# Patient Record
Sex: Female | Born: 1995 | Race: White | Hispanic: No | Smoking: Never smoker
Health system: Southern US, Community
[De-identification: ages and names within clinical notes are randomized; demographics above are authoritative.]

## PROBLEM LIST (undated history)

## (undated) DIAGNOSIS — F319 Bipolar disorder, unspecified: Secondary | ICD-10-CM

## (undated) DIAGNOSIS — F32A Depression, unspecified: Secondary | ICD-10-CM

## (undated) DIAGNOSIS — N83209 Unspecified ovarian cyst, unspecified side: Secondary | ICD-10-CM

## (undated) DIAGNOSIS — F329 Major depressive disorder, single episode, unspecified: Secondary | ICD-10-CM

## (undated) DIAGNOSIS — J45909 Unspecified asthma, uncomplicated: Secondary | ICD-10-CM

## (undated) DIAGNOSIS — R102 Pelvic and perineal pain: Secondary | ICD-10-CM

## (undated) DIAGNOSIS — R112 Nausea with vomiting, unspecified: Secondary | ICD-10-CM

## (undated) DIAGNOSIS — G8929 Other chronic pain: Secondary | ICD-10-CM

## (undated) DIAGNOSIS — L0591 Pilonidal cyst without abscess: Secondary | ICD-10-CM

## (undated) DIAGNOSIS — N809 Endometriosis, unspecified: Secondary | ICD-10-CM

## (undated) DIAGNOSIS — Z9889 Other specified postprocedural states: Secondary | ICD-10-CM

## (undated) DIAGNOSIS — F419 Anxiety disorder, unspecified: Secondary | ICD-10-CM

## (undated) DIAGNOSIS — F4310 Post-traumatic stress disorder, unspecified: Secondary | ICD-10-CM

## (undated) HISTORY — PX: WISDOM TOOTH EXTRACTION: SHX21

## (undated) HISTORY — DX: Pilonidal cyst without abscess: L05.91

## (undated) HISTORY — DX: Anxiety disorder, unspecified: F41.9

## (undated) HISTORY — PX: ESOPHAGOGASTRODUODENOSCOPY: SHX1529

## (undated) HISTORY — PX: COLONOSCOPY WITH ESOPHAGOGASTRODUODENOSCOPY (EGD): SHX5779

## (undated) HISTORY — DX: Major depressive disorder, single episode, unspecified: F32.9

## (undated) HISTORY — DX: Depression, unspecified: F32.A

---
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
## 2015-02-15 NOTE — H&P (Signed)
   Subjective/Chief Complaint RUQ/right sided pain   History of Present Illness [name] is a pleasant 19 yo F with relatively no medical history who presents with 1 day of right sided/RUQ pain.  She says that it began acutely.  Describes it as stabbing.  + nausea, no vomiting.  + subjective fevers.  No BM.  Has never had this pain before.   Past Med/Surgical Hx:  Asthma:   GERD:   ALLERGIES:  No Known Allergies:   Family and Social History:  Family History Cancer   Social History negative tobacco, negative ETOH   Place of Living Home   Review of Systems:  Subjective/Chief Complaint RUQ pain/Right sided abd pain   Fever/Chills Yes   Cough No   Sputum No   Abdominal Pain Yes   Diarrhea No   Constipation No   Nausea/Vomiting Yes   SOB/DOE No   Chest Pain No   Dysuria No   Tolerating Diet No  Nauseated   Physical Exam:  GEN well developed, well nourished, no acute distress   HEENT pink conjunctivae, PERRL   RESP normal resp effort  clear BS  no use of accessory muscles   CARD regular rate  no thrills  No LE edema   ABD positive tenderness  denies Flank Tenderness  no hernia  soft  distended  Mild right sided/RUQ tenderness   EXTR negative cyanosis/clubbing, negative edema   SKIN normal to palpation, No rashes, No ulcers   NEURO cranial nerves intact, negative Babinski R/L, negative rigidity, negative tremor, follows commands, strength:   PSYCH A+O to time, place, person, good insight    Assessment/Admission Diagnosis [name] is a pleasant 19 yo F with 1 day of right sided abdominal pain, leukocytosis, mild stranding around right colon at hepatic flexure with a few calcified diverticula.  Unusual for a patient of this age but favor diverticulitis.  Uncomfortable but not ill appearing.   Plan Admit for NPO, IV antibiotics, serial abdominal exams.  No acute surgical issues at this time.   Electronic Signatures: Jarvis NewcomerLundquist, Mansoor Hillyard A (MD)  (Signed  19-Apr-15 04:45)  Authored: CHIEF COMPLAINT and HISTORY, PAST MEDICAL/SURGIAL HISTORY, ALLERGIES, FAMILY AND SOCIAL HISTORY, REVIEW OF SYSTEMS, PHYSICAL EXAM, ASSESSMENT AND PLAN   Last Updated: 19-Apr-15 04:45 by Jarvis NewcomerLundquist, Morning Halberg A (MD)

## 2015-02-15 NOTE — Discharge Summary (Signed)
PATIENT NAME:  Madison Dixon, Madison Dixon MR#:  [id number] DATE OF BIRTH:  [birth year]  DATE OF ADMISSION:  02/10/2014  DATE OF DISCHARGE:  02/13/2014  DIAGNOSES:  1.  Abdominal pain. 2.  Colitis.   PROCEDURES: None.   CONSULTANTS: None.   HISTORY OF PRESENT ILLNESS AND HOSPITAL COURSE:  This is a 19 year old female patient who has had no medical history in the past, but presented with one day of right upper quadrant and right side pain. Dr. Juliann PulseLundquist had seen the patient in the Emergency Room, and admitted her through the Emergency Room after CT scan suggested colitis of the hepatic flexure. Her white blood cell count was normal, but her physical exam suggested right upper quadrant pain and tenderness without peritoneal signs. She was started on IV antibiotics. I took over her care the next day, and she remained tender. I asked Dr. Michela PitcherEly to see the patient as well, and I ordered an ultrasound of the right upper quadrant to ensure that this was not gallbladder disease. There was no evidence of gallbladder disease. Subsequently, the IV antibiotics appeared to work and the patient was doing well. Her pain was not gone, but nearly gone, nearly resolved. She was tolerating a regular diet. There are no pediatric GI consultants available at the hospital. Therefore, it was decided, with her improvement and resolution of this pain and tenderness, on IV antibiotics, that she be continued on oral antibiotics. She was given Cipro and Flagyl p.o. and oral Vicodin for pain, if she were to require it at home.   I have asked her to see her primary care physician in the next week, and ask for a referral to their favored pediatric GI physician. I mentioned that there is a pediatric GI in [place] as well as at all of the major tertiary care facilities around [place]. This was discussed in detail with the grandmother, who voiced agreement and understanding of the plan. She will follow up in our office as needed.       ____________________________ Adah Salvageichard E. Excell Seltzerooper, MD rec:mr D: 02/13/2014 16:19:11 ET T: 02/13/2014 19:01:56 ET JOB#: [id number]  cc: Adah Salvageichard E. Excell Seltzerooper, MD, <Dictator> Lattie HawICHARD E Cova Knieriem MD ELECTRONICALLY SIGNED 02/14/2014 7:45

## 2015-03-06 ENCOUNTER — Encounter: Payer: Self-pay | Admitting: *Deleted

## 2015-03-06 ENCOUNTER — Ambulatory Visit (INDEPENDENT_AMBULATORY_CARE_PROVIDER_SITE_OTHER): Payer: Medicaid Other | Admitting: General Surgery

## 2015-03-06 ENCOUNTER — Encounter: Payer: Self-pay | Admitting: General Surgery

## 2015-03-06 DIAGNOSIS — L0592 Pilonidal sinus without abscess: Secondary | ICD-10-CM

## 2015-03-06 DIAGNOSIS — L0591 Pilonidal cyst without abscess: Secondary | ICD-10-CM | POA: Diagnosis not present

## 2015-03-06 NOTE — Progress Notes (Signed)
Patient ID: Madison Dixon, female   DOB: [birth year], 19 y.o.   MRN: [id number]  Chief Complaint  Patient presents with  . Other    pilonidal sinus    HPI Madison Dixon is a 19 y.o. female.  Here today for evaluation of pilonidal cyst. She states she has had trouble on and off for many years but never told anyone. She states it would usually clear up on its on. She states it started draining last week and the antibiotics have helped. She has been on the antibiotics for 3 days.   HPI  History reviewed. No pertinent past medical history.  History reviewed. No pertinent past surgical history.  History reviewed. No pertinent family history.  Social History History  Substance Use Topics  . Smoking status: Never Smoker   . Smokeless tobacco: Never Used  . Alcohol Use: No    No Known Allergies  Current Outpatient Prescriptions  Medication Sig Dispense Refill  . amoxicillin-clavulanate (AUGMENTIN) 875-125 MG per tablet Take 1 tablet by mouth 2 (two) times daily.  0  . etonogestrel (NEXPLANON) 68 MG IMPL implant 1 each by Subdermal route once.     No current facility-administered medications for this visit.    Review of Systems Review of Systems  Constitutional: Negative.   Respiratory: Negative.   Cardiovascular: Negative.     Blood pressure 100/68, pulse 66, resp. rate 12, height 5\' 2"  (1.575 m), weight 137 lb (62.143 kg), last menstrual period 02/08/2015.  Physical Exam Physical Exam  Constitutional: She is oriented to person, place, and time. She appears well-developed and well-nourished.  Neck: Neck supple.  Cardiovascular: Normal rate, regular rhythm and normal heart sounds.   Pulmonary/Chest: Effort normal and breath sounds normal.  Lymphadenopathy:    She has no cervical adenopathy.  Neurological: She is alert and oriented to person, place, and time.  Skin: Skin is warm and dry.     Prominent sinus track with some unhealthy tissue.    Data Reviewed Office  notes.  Assessment    Pilonidal sinus    Plan    With consent the sinus tract  Was cauterized with silver nitrate. Patient advised on local hygiene. Complete current course of ABX.Follow up in 2 weeks.         PCP:  Woodfin Ganjahavin, Bob  SANKAR,SEEPLAPUTHUR G 03/06/2015, 11:07 AM

## 2015-03-06 NOTE — Patient Instructions (Signed)
Pilonidal Cyst A pilonidal cyst occurs when hairs get trapped (ingrown) beneath the skin in the crease between the buttocks over your sacrum (the bone under that crease). Pilonidal cysts are most common in young men with a lot of body hair. When the cyst is ruptured (breaks) or leaking, fluid from the cyst may cause burning and itching. If the cyst becomes infected, it causes a painful swelling filled with pus (abscess). The pus and trapped hairs need to be removed (often by lancing) so that the infection can heal. However, recurrence is common and an operation may be needed to remove the cyst. HOME CARE INSTRUCTIONS   If the cyst was NOT INFECTED:  Keep the area clean and dry. Bathe or shower daily. Wash the area well with a germ-killing soap. Warm tub baths may help prevent infection and help with drainage. Dry the area well with a towel.  Avoid tight clothing to keep area as moisture free as possible.  Keep area between buttocks as free of hair as possible. A depilatory may be used.  If the cyst WAS INFECTED and needed to be drained:  Your caregiver packed the wound with gauze to keep the wound open. This allows the wound to heal from the inside outwards and continue draining.  Return for a wound check in 1 day or as suggested.  If you take tub baths or showers, repack the wound with gauze following them. Sponge baths (at the sink) are a good alternative.  If an antibiotic was ordered to fight the infection, take as directed.  Only take over-the-counter or prescription medicines for pain, discomfort, or fever as directed by your caregiver.  After the drain is removed, use sitz baths for 20 minutes 4 times per day. Clean the wound gently with mild unscented soap, pat dry, and then apply a dry dressing. SEEK MEDICAL CARE IF:   You have increased pain, swelling, redness, drainage, or bleeding from the area.  You have a fever.  You have muscles aches, dizziness, or a general ill  feeling. Document Released: 10/08/2000 Document Revised: 01/03/2012 Document Reviewed: 12/06/2008 ExitCare Patient Information 2015 [employer]. This information is not intended to replace advice given to you by your health care provider. Make sure you discuss any questions you have with your health care provider.  

## 2015-03-20 ENCOUNTER — Encounter: Payer: Self-pay | Admitting: General Surgery

## 2015-03-20 ENCOUNTER — Encounter: Payer: Self-pay | Admitting: *Deleted

## 2015-03-20 ENCOUNTER — Ambulatory Visit (INDEPENDENT_AMBULATORY_CARE_PROVIDER_SITE_OTHER): Payer: Medicaid Other | Admitting: General Surgery

## 2015-03-20 VITALS — BP 120/72 | HR 68 | Resp 12 | Ht 62.0 in | Wt 147.0 lb

## 2015-03-20 DIAGNOSIS — L0592 Pilonidal sinus without abscess: Secondary | ICD-10-CM

## 2015-03-20 DIAGNOSIS — L0591 Pilonidal cyst without abscess: Secondary | ICD-10-CM

## 2015-03-20 MED ORDER — SULFAMETHOXAZOLE-TRIMETHOPRIM 400-80 MG PO TABS: 1.0000 | ORAL_TABLET | Freq: Two times a day (BID) | ORAL | Status: DC

## 2015-03-20 NOTE — Progress Notes (Signed)
Patient ID: Madison Dixon, female   DOB: 04-24-1996, 19 y.o.   MRN: [id number]  Chief Complaint  Patient presents with  . Follow-up    HPI Madison Dixon is a 19 y.o. female.  Here today for follow up evaluation of pilonidal cyst. She states she started vomiting with the antibiotics so she stopped taking them at the advise of Toni ArthursBob Chauvin. She states the area started bleeding yesterday a small amount but before that it was OK.   HPI  Past Medical History  Diagnosis Date  . Pilonidal cyst     History reviewed. No pertinent past surgical history.  History reviewed. No pertinent family history.  Social History History  Substance Use Topics  . Smoking status: Never Smoker   . Smokeless tobacco: Never Used  . Alcohol Use: No    No Known Allergies  Current Outpatient Prescriptions  Medication Sig Dispense Refill  . etonogestrel (NEXPLANON) 68 MG IMPL implant 1 each by Subdermal route once.    . sulfamethoxazole-trimethoprim (BACTRIM,SEPTRA) 400-80 MG per tablet Take 1 tablet by mouth 2 (two) times daily. 20 tablet 0   No current facility-administered medications for this visit.    Review of Systems Review of Systems  Constitutional: Negative.   Respiratory: Negative.   Cardiovascular: Negative.   Gastrointestinal: Positive for vomiting.  Neurological: Positive for headaches.    Blood pressure 120/72, pulse 68, resp. rate 12, height 5\' 2"  (1.575 m), weight 147 lb (66.679 kg), last menstrual period 02/08/2015.  Physical Exam Physical Exam  Constitutional: She is oriented to person, place, and time. She appears well-developed and well-nourished.  Eyes: Conjunctivae are normal. No scleral icterus.  Cardiovascular: Normal rate, regular rhythm and normal heart sounds.   Pulmonary/Chest: Effort normal and breath sounds normal.  Neurological: She is alert and oriented to person, place, and time.  Skin: Skin is warm and dry.  Pilonidal sinus tract remains.  There is some  serous drainage noted  Data Reviewed Office notes.  Assessment    Pilonidal sinus remains-likely will benefit with excision. Discussed fully with pt and she is agreeable     Plan    Discussed risk and benefits of excision of pilonidal sinus tract.    Patient's surgery has been [facility].  PCP:  Jerald Kiefhauvin, Robert   SANKAR,SEEPLAPUTHUR G 03/20/2015, 3:10 PM

## 2015-03-20 NOTE — Patient Instructions (Addendum)
The patient is aware to call back for any questions or concerns.  Patient's surgery has been scheduled for 04[facility]10-15 at [facility].

## 2015-04-03 ENCOUNTER — Other Ambulatory Visit: Payer: Self-pay

## 2015-04-03 ENCOUNTER — Encounter: Payer: Self-pay | Admitting: *Deleted

## 2015-04-03 DIAGNOSIS — L0591 Pilonidal cyst without abscess: Secondary | ICD-10-CM | POA: Diagnosis not present

## 2015-04-03 DIAGNOSIS — Z79899 Other long term (current) drug therapy: Secondary | ICD-10-CM | POA: Diagnosis not present

## 2015-04-03 NOTE — Patient Instructions (Signed)
  Your procedure is scheduled on:04/10/15 Report to Day Surgery. To find out your arrival time please call [phone] between 1PM - 3PM on 04/09/15.  Remember: Instructions that are not followed completely may result in serious medical risk, up to and including death, or upon the discretion of your surgeon and anesthesiologist your surgery may need to be rescheduled.    __x_ 1. Do not eat food or drink liquids after midnight. No gum chewing or hard candies.     __x__ 2. No Alcohol for 24 hours before or after surgery.   ____ 3. Bring all medications with you on the day of surgery if instructed.    ___x_ 4. Notify your doctor if there is any change in your medical condition     (cold, fever, infections).     Do not wear jewelry, make-up, hairpins, clips or nail polish.  Do not wear lotions, powders, or perfumes. You may wear deodorant.  Do not shave 48 hours prior to surgery. Men may shave face and neck.  Do not bring valuables to the hospital.    [facility] is not responsible for any belongings or valuables.               Contacts, dentures or bridgework may not be worn into surgery.  Leave your suitcase in the car. After surgery it may be brought to your room.  For patients admitted to the hospital, discharge time is determined by your                treatment team.   Patients discharged the day of surgery will not be allowed to drive home.   Please read over the following fact sheets that you were given:   Surgical Site Infection Prevention   ____ Take these medicines the morning of surgery with A SIP OF WATER:    1.   2.   3.   4.  5.  6.  ____ Fleet Enema (as directed)   __x__ Use CHG Soap as directed  ____ Use inhalers on the day of surgery  ____ Stop metformin 2 days prior to surgery    ____ Take 1/2 of usual insulin dose the night before surgery and none on the morning of surgery.   ____ Stop Coumadin/Plavix/aspirin on   ____ Stop Anti-inflammatories  on ____ Stop supplements until after surgery.    ____ Bring C-Pap to the hospital.

## 2015-04-09 ENCOUNTER — Encounter: Payer: Self-pay | Admitting: *Deleted

## 2015-04-10 ENCOUNTER — Ambulatory Visit: Payer: Medicaid Other | Admitting: Anesthesiology

## 2015-04-10 ENCOUNTER — Ambulatory Visit
Admission: RE | Admit: 2015-04-10 | Discharge: 2015-04-10 | Disposition: A | Discharge status: Home / Self Care | Payer: Medicaid Other | Source: Ambulatory Visit | Attending: General Surgery | Admitting: General Surgery

## 2015-04-10 ENCOUNTER — Encounter (INDEPENDENT_AMBULATORY_CARE_PROVIDER_SITE_OTHER): Payer: Medicaid Other | Admitting: General Surgery

## 2015-04-10 ENCOUNTER — Encounter
Admission: RE | Disposition: A | Discharge status: Home / Self Care | Payer: Self-pay | Source: Ambulatory Visit | Attending: General Surgery

## 2015-04-10 ENCOUNTER — Encounter: Payer: Self-pay | Admitting: *Deleted

## 2015-04-10 DIAGNOSIS — Z79899 Other long term (current) drug therapy: Secondary | ICD-10-CM | POA: Insufficient documentation

## 2015-04-10 DIAGNOSIS — L0592 Pilonidal sinus without abscess: Secondary | ICD-10-CM

## 2015-04-10 DIAGNOSIS — L0591 Pilonidal cyst without abscess: Secondary | ICD-10-CM | POA: Diagnosis not present

## 2015-04-10 HISTORY — PX: PILONIDAL CYST EXCISION: SHX744

## 2015-04-10 HISTORY — DX: Unspecified asthma, uncomplicated: J45.909

## 2015-04-10 LAB — GLUCOSE, CAPILLARY: Glucose-Capillary: 87 mg/dL (ref 65–99)

## 2015-04-10 LAB — POCT PREGNANCY, URINE: PREG TEST UR: NEGATIVE

## 2015-04-10 SURGERY — EXCISION, PILONIDAL CYST, EXTENSIVE
Anesthesia: General

## 2015-04-10 MED ORDER — LIDOCAINE HCL (CARDIAC) 20 MG/ML IV SOLN
INTRAVENOUS | Status: DC | PRN
  Administered 2015-04-10: 100 mg via INTRAVENOUS

## 2015-04-10 MED ORDER — FENTANYL CITRATE (PF) 100 MCG/2ML IJ SOLN
INTRAMUSCULAR | Status: DC | PRN
  Administered 2015-04-10 (×2): 50 ug via INTRAVENOUS
  Administered 2015-04-10: 100 ug via INTRAVENOUS

## 2015-04-10 MED ORDER — CEFAZOLIN SODIUM-DEXTROSE 2-3 GM-% IV SOLR
2.0000 g | INTRAVENOUS | Status: AC
  Administered 2015-04-10: 2 g via INTRAVENOUS

## 2015-04-10 MED ORDER — BUPIVACAINE HCL 0.5 % IJ SOLN
INTRAMUSCULAR | Status: DC | PRN
  Administered 2015-04-10: 10 mL

## 2015-04-10 MED ORDER — FENTANYL CITRATE (PF) 100 MCG/2ML IJ SOLN
INTRAMUSCULAR | Status: AC
  Administered 2015-04-10: 25 ug via INTRAVENOUS
  Filled 2015-04-10: qty 2

## 2015-04-10 MED ORDER — LACTATED RINGERS IV SOLN
INTRAVENOUS | Status: DC
  Administered 2015-04-10: 07:00:00 via INTRAVENOUS
  Administered 2015-04-10: 1000 mL via INTRAVENOUS
  Administered 2015-04-10: 07:00:00 via INTRAVENOUS

## 2015-04-10 MED ORDER — CEFAZOLIN SODIUM-DEXTROSE 2-3 GM-% IV SOLR
INTRAVENOUS | Status: AC
  Administered 2015-04-10: 2 g via INTRAVENOUS
  Filled 2015-04-10: qty 50

## 2015-04-10 MED ORDER — ONDANSETRON HCL 4 MG/2ML IJ SOLN
INTRAMUSCULAR | Status: DC | PRN
  Administered 2015-04-10: 4 mg via INTRAVENOUS

## 2015-04-10 MED ORDER — HYDROMORPHONE HCL 1 MG/ML IJ SOLN: 0.2500 mg | INTRAMUSCULAR | Status: DC | PRN

## 2015-04-10 MED ORDER — FENTANYL CITRATE (PF) 100 MCG/2ML IJ SOLN
25.0000 ug | INTRAMUSCULAR | Status: AC | PRN
  Administered 2015-04-10 (×6): 25 ug via INTRAVENOUS

## 2015-04-10 MED ORDER — BUPIVACAINE HCL (PF) 0.5 % IJ SOLN
INTRAMUSCULAR | Status: AC
  Filled 2015-04-10: qty 30

## 2015-04-10 MED ORDER — ONDANSETRON HCL 4 MG/2ML IJ SOLN: 4.0000 mg | Freq: Once | INTRAMUSCULAR | Status: DC | PRN

## 2015-04-10 MED ORDER — OXYCODONE-ACETAMINOPHEN 5-325 MG PO TABS
ORAL_TABLET | ORAL | Status: AC
  Filled 2015-04-10: qty 1

## 2015-04-10 MED ORDER — SUCCINYLCHOLINE CHLORIDE 20 MG/ML IJ SOLN
INTRAMUSCULAR | Status: DC | PRN
  Administered 2015-04-10: 70 mg via INTRAVENOUS

## 2015-04-10 MED ORDER — PROPOFOL 10 MG/ML IV BOLUS
INTRAVENOUS | Status: DC | PRN
  Administered 2015-04-10: 150 mg via INTRAVENOUS
  Administered 2015-04-10: 30 mg via INTRAVENOUS

## 2015-04-10 MED ORDER — OXYCODONE-ACETAMINOPHEN 5-325 MG PO TABS: 1.0000 | ORAL_TABLET | ORAL | Status: DC | PRN

## 2015-04-10 MED ORDER — MIDAZOLAM HCL 2 MG/2ML IJ SOLN
INTRAMUSCULAR | Status: DC | PRN
  Administered 2015-04-10: 2 mg via INTRAVENOUS

## 2015-04-10 SURGICAL SUPPLY — 24 items
BRIEF STRETCH MATERNITY 2XLG (MISCELLANEOUS) ×2 IMPLANT
CANISTER SUCT 1200ML W/VALVE (MISCELLANEOUS) ×2 IMPLANT
CHLORAPREP W/TINT 26ML (MISCELLANEOUS) IMPLANT
DRAPE LAPAROTOMY 100X77 ABD (DRAPES) ×2 IMPLANT
DRESSING TELFA 4X3 1S ST N-ADH (GAUZE/BANDAGES/DRESSINGS) IMPLANT
DRSG TEGADERM 4X4.75 (GAUZE/BANDAGES/DRESSINGS) IMPLANT
GLOVE BIO SURGEON STRL SZ7 (GLOVE) ×14 IMPLANT
GOWN STRL REUS W/ TWL LRG LVL3 (GOWN DISPOSABLE) ×4 IMPLANT
GOWN STRL REUS W/TWL LRG LVL3 (GOWN DISPOSABLE) ×4
KIT RM TURNOVER STRD PROC AR (KITS) ×2 IMPLANT
LABEL OR SOLS (LABEL) IMPLANT
LIQUID BAND (GAUZE/BANDAGES/DRESSINGS) ×2 IMPLANT
NDL SAFETY 25GX1.5 (NEEDLE) ×2 IMPLANT
NS IRRIG 500ML POUR BTL (IV SOLUTION) ×2 IMPLANT
PACK BASIN MINOR ARMC (MISCELLANEOUS) ×2 IMPLANT
PAD GROUND ADULT SPLIT (MISCELLANEOUS) ×2 IMPLANT
PAD OB MATERNITY 4.3X12.25 (PERSONAL CARE ITEMS) ×2 IMPLANT
SCRUB POVIDONE IODINE 4 OZ (MISCELLANEOUS) ×2 IMPLANT
STRIP CLOSURE SKIN 1/2X4 (GAUZE/BANDAGES/DRESSINGS) IMPLANT
SUT MNCRL AB 3-0 PS2 27 (SUTURE) ×2 IMPLANT
SUT VIC AB 2-0 CT1 27 (SUTURE) ×2
SUT VIC AB 2-0 CT1 TAPERPNT 27 (SUTURE) ×2 IMPLANT
SYR BULB IRRIG 60ML STRL (SYRINGE) ×2 IMPLANT
SYR CONTROL 10ML (SYRINGE) ×2 IMPLANT

## 2015-04-10 NOTE — Transfer of Care (Signed)
Immediate Anesthesia Transfer of Care Note  Patient: Madison Dixon  Procedure(s) Performed: Procedure(s): CYST EXCISION PILONIDAL EXTENSIVE (N/A)  Patient Location: PACU  Anesthesia Type:General  Level of Consciousness: awake  Airway & Oxygen Therapy: Patient Spontanous Breathing  Post-op Assessment: Report given to RN  Post vital signs: stable  Last Vitals:  Filed Vitals:   04/10/15 0839  BP: 108/62  Pulse: 84  Temp: 37.4 C  Resp: 16   Past Medical History  Diagnosis Date  . Pilonidal cyst   . Asthma     smoke induced   History reviewed. No pertinent past surgical history.  Complications: No apparent anesthesia complications

## 2015-04-10 NOTE — Anesthesia Postprocedure Evaluation (Signed)
  Anesthesia Post-op Note  Patient: Madison Dixon  Procedure(s) Performed: Procedure(s): CYST EXCISION PILONIDAL EXTENSIVE (N/A)  Anesthesia type:General  Patient location: PACU  Post pain: Pain level controlled  Post assessment: Post-op Vital signs reviewed, Patient's Cardiovascular Status Stable, Respiratory Function Stable, Patent Airway and No signs of Nausea or vomiting  Post vital signs: Reviewed and stable  Last Vitals:  Filed Vitals:   04/10/15 0910  BP:   Pulse: 69  Temp:   Resp: 12    Level of consciousness: awake, alert  and patient cooperative  Complications: No apparent anesthesia complications

## 2015-04-10 NOTE — Op Note (Signed)
Preop diagnosis: Pilonidal sinus  Post op diagnosis: Same  Operation: Excision pilonidal sinus  Surgeon: S.G.Zohra Clavel  Assistant:     Anesthesia: Gen.  Complications: None  EBL: Minimal  Drains: None  Description: Patient was brought to the operating room and put to sleep with an endotracheal tube she was then moved from the stretcher to the operating table and placed on the prone position with attention paid to all pressure points. The gluteal region was then satisfactorily exposed and prepped and draped sterile field. Timeout was performed. Patient had a prominent sinus opening connecting to were the small skin openings inferior to this in the upper most part of the gluteal cleft. A sinus probe was inserted and the tract extended down to the lower sterile skin opening in the midline no surrounding induration was noted. Elliptical vertical excision of this was performed. After skin incision was made and was deepened in a perpendicular fashion down to the fascia. And the entire pilonidal sinus was excised out from the underlying fascia completely. Bleeding was controlled with cautery. The deeper tissues including the fascia was approximated with interrupted 2-0 Vicryl stitches. Skin was closed with a running subcuticular stitch of 3-0 Monocryl. Liqui ban was applied. A padded dressing placed over this. Patient subsequently was placed supine extubated and returned recovery room in stable condition.

## 2015-04-10 NOTE — Interval H&P Note (Signed)
History and Physical Interval Note:  04/10/2015 6:58 AM  Madison Dixon  has presented today for surgery, with the diagnosis of PILONIDAL SINUS,  The various methods of treatment have been discussed with the patient and family. After consideration of risks, benefits and other options for treatment, the patient has consented to  Procedure(s): CYST EXCISION PILONIDAL EXTENSIVE (N/A) as a surgical intervention .  The patient's history has been reviewed, patient examined, no change in status, stable for surgery.  I have reviewed the patient's chart and labs.  Questions were answered to the patient's satisfaction.     Wavie Hashimi G

## 2015-04-10 NOTE — Anesthesia Preprocedure Evaluation (Signed)
Anesthesia Evaluation  Patient identified by MRN, date of birth, ID band Patient awake    Reviewed: Allergy & Precautions, NPO status , Patient's Chart, lab work & pertinent test results  History of Anesthesia Complications Negative for: history of anesthetic complications  Airway Mallampati: II  TM Distance: >3 FB Neck ROM: Full    Dental no notable dental hx.    Pulmonary asthma ,  breath sounds clear to auscultation  Pulmonary exam normal       Cardiovascular Exercise Tolerance: Good negative cardio ROS Normal cardiovascular examRhythm:Regular Rate:Normal     Neuro/Psych negative neurological ROS  negative psych ROS   GI/Hepatic negative GI ROS, Neg liver ROS,   Endo/Other  negative endocrine ROS  Renal/GU negative Renal ROS  negative genitourinary   Musculoskeletal negative musculoskeletal ROS (+)   Abdominal   Peds negative pediatric ROS (+)  Hematology negative hematology ROS (+)   Anesthesia Other Findings   Reproductive/Obstetrics negative OB ROS                             Anesthesia Physical Anesthesia Plan  ASA: II  Anesthesia Plan: General   Post-op Pain Management:    Induction: Intravenous  Airway Management Planned: Oral ETT  Additional Equipment:   Intra-op Plan:   Post-operative Plan: Extubation in OR  Informed Consent: I have reviewed the patients History and Physical, chart, labs and discussed the procedure including the risks, benefits and alternatives for the proposed anesthesia with the patient or authorized representative who has indicated his/her understanding and acceptance.   Dental advisory given  Plan Discussed with: CRNA and Surgeon  Anesthesia Plan Comments:         Anesthesia Quick Evaluation

## 2015-04-10 NOTE — H&P (View-Only) (Signed)
Patient ID: Madison Dixon, female   DOB: 23-Jul-1996, 19 y.o.   MRN: [id number]  Chief Complaint  Patient presents with  . Follow-up    HPI Madison Dixon is a 19 y.o. female.  Here today for follow up evaluation of pilonidal cyst. She states she started vomiting with the antibiotics so she stopped taking them at the advise of Toni Arthurs. She states the area started bleeding yesterday a small amount but before that it was OK.   HPI  Past Medical History  Diagnosis Date  . Pilonidal cyst     History reviewed. No pertinent past surgical history.  History reviewed. No pertinent family history.  Social History History  Substance Use Topics  . Smoking status: Never Smoker   . Smokeless tobacco: Never Used  . Alcohol Use: No    No Known Allergies  Current Outpatient Prescriptions  Medication Sig Dispense Refill  . etonogestrel (NEXPLANON) 68 MG IMPL implant 1 each by Subdermal route once.    . sulfamethoxazole-trimethoprim (BACTRIM,SEPTRA) 400-80 MG per tablet Take 1 tablet by mouth 2 (two) times daily. 20 tablet 0   No current facility-administered medications for this visit.    Review of Systems Review of Systems  Constitutional: Negative.   Respiratory: Negative.   Cardiovascular: Negative.   Gastrointestinal: Positive for vomiting.  Neurological: Positive for headaches.    Blood pressure 120/72, pulse 68, resp. rate 12, height 5\' 2"  (1.575 m), weight 147 lb (66.679 kg), last menstrual period 02/08/2015.  Physical Exam Physical Exam  Constitutional: She is oriented to person, place, and time. She appears well-developed and well-nourished.  Eyes: Conjunctivae are normal. No scleral icterus.  Cardiovascular: Normal rate, regular rhythm and normal heart sounds.   Pulmonary/Chest: Effort normal and breath sounds normal.  Neurological: She is alert and oriented to person, place, and time.  Skin: Skin is warm and dry.  Pilonidal sinus tract remains.  There is some  serous drainage noted  Data Reviewed Office notes.  Assessment    Pilonidal sinus remains-likely will benefit with excision. Discussed fully with pt and she is agreeable     Plan    Discussed risk and benefits of excision of pilonidal sinus tract.    Patient's surgery has been scheduled for 04-10-15 at [facility].  PCP:  Jerald Kief 03/20/2015, 3:10 PM

## 2015-04-11 LAB — SURGICAL PATHOLOGY

## 2015-04-17 ENCOUNTER — Ambulatory Visit: Payer: Medicaid Other | Admitting: General Surgery

## 2015-04-21 ENCOUNTER — Encounter: Payer: Self-pay | Admitting: General Surgery

## 2015-04-21 ENCOUNTER — Ambulatory Visit (INDEPENDENT_AMBULATORY_CARE_PROVIDER_SITE_OTHER): Payer: Medicaid Other | Admitting: General Surgery

## 2015-04-21 VITALS — BP 118/66 | HR 62 | Resp 12 | Ht 62.0 in | Wt 144.0 lb

## 2015-04-21 DIAGNOSIS — L0592 Pilonidal sinus without abscess: Secondary | ICD-10-CM

## 2015-04-21 DIAGNOSIS — L0591 Pilonidal cyst without abscess: Secondary | ICD-10-CM

## 2015-04-21 NOTE — Progress Notes (Signed)
Patient ID: [name], female   DOB: 02/23/1996, 19 y.o.   MRN: [id number] Patient here to follow up from pilonidal cyst excision done on 04/10/15. Patient states she woke up this morning and it was bleeding-she applied a gauze pad over it. She has been doing well and she is still taking the pain medication.   Mild separation of the skin in the midportion of gluteal area. Area looks clean. No surrounding redness or induration.   Can shower normally. Refrain from activity that stretches or tears the area. Apply antibiotic ointment and gauze over area.   Follow up: 2 weeks  PCP: Anola Gurneyhauvin, Robert, MD

## 2015-04-29 ENCOUNTER — Telehealth: Payer: Self-pay | Admitting: *Deleted

## 2015-04-29 ENCOUNTER — Encounter: Payer: Self-pay | Admitting: *Deleted

## 2015-04-29 NOTE — Telephone Encounter (Signed)
She called to say she had run out of Percocet and was still having pain. She did not feel she could go back to work just yet, she felt she needed one more week. I talked with the patient and she is aware to try 2 aleve Bid, work note given. RTW on 05-12-15, pt agrees. Follow up is next week with MD.

## 2015-05-06 ENCOUNTER — Ambulatory Visit (INDEPENDENT_AMBULATORY_CARE_PROVIDER_SITE_OTHER): Payer: Medicaid Other | Admitting: General Surgery

## 2015-05-06 ENCOUNTER — Encounter: Payer: Self-pay | Admitting: General Surgery

## 2015-05-06 VITALS — BP 118/80 | HR 82 | Resp 12 | Ht 62.0 in | Wt 152.0 lb

## 2015-05-06 DIAGNOSIS — L0592 Pilonidal sinus without abscess: Secondary | ICD-10-CM

## 2015-05-06 DIAGNOSIS — L0591 Pilonidal cyst without abscess: Secondary | ICD-10-CM

## 2015-05-06 NOTE — Progress Notes (Signed)
This is a 19 year old female here today for her post op pilonidal cyst excision 04/10/15. Patient states she is bleeding and the excision has opened back up.  Skin is separated 3/4 of the way. Some fibrinous drainage noted, c/s cent. No surrounding redness or induration. If culture shows bacterial growth will place on appropriate antibiotic. F/u in 10 days. Stay off work till f/u visit

## 2015-05-06 NOTE — Patient Instructions (Addendum)
Patient to return in 10 days. Use shower to help clean the area. Dry dressing or feminine pad for now. Will contact her with culture report.

## 2015-05-07 ENCOUNTER — Telehealth: Payer: Self-pay | Admitting: *Deleted

## 2015-05-07 NOTE — Telephone Encounter (Signed)
Spoke with patient and let her know it would be better for the area to remain open to prevent infection and keep anything from getting trapped there. Advised to rinse area with warm water and mild soap and to keep the area dry after cleansing. She will follow up as scheduled.

## 2015-05-07 NOTE — Telephone Encounter (Signed)
Patient called and had surgery on 04/10/15 by Dr.Sankar for a pilonidal cyst. Patient is scared that since its open that it could get infected and cause another cyst to come up. She was wondering if she can have a suture put in that area or glued together so it wont be open.

## 2015-05-08 ENCOUNTER — Encounter: Payer: Self-pay | Admitting: General Surgery

## 2015-05-08 ENCOUNTER — Ambulatory Visit (INDEPENDENT_AMBULATORY_CARE_PROVIDER_SITE_OTHER): Payer: Medicaid Other | Admitting: General Surgery

## 2015-05-08 VITALS — BP 120/78 | HR 89 | Resp 14 | Ht 62.0 in | Wt 151.0 lb

## 2015-05-08 DIAGNOSIS — L0592 Pilonidal sinus without abscess: Secondary | ICD-10-CM

## 2015-05-08 DIAGNOSIS — L0591 Pilonidal cyst without abscess: Secondary | ICD-10-CM

## 2015-05-08 MED ORDER — ONDANSETRON HCL 4 MG PO TABS: 4.0000 mg | ORAL_TABLET | Freq: Three times a day (TID) | ORAL | Status: DC | PRN

## 2015-05-08 MED ORDER — AMOXICILLIN 875 MG PO TABS: 875.0000 mg | ORAL_TABLET | Freq: Two times a day (BID) | ORAL | Status: DC

## 2015-05-08 NOTE — Progress Notes (Signed)
Patient ID: Madison Dixon, female   DOB: [birth year], [age] y.o.   MRN: [id number] Patient is here today for her post op pilonidal cyst excision done on 04/10/15. Patient states the area is still open. She reports that the area has opened more since two days ago. She reports bleeding from the area and some whitish discharge. She report increase in pain from this. She denies any fevers or chills.   Exam shows the pilonidal wound is open 1/2cm deep. No redness or induration around. No signs of deep pocket of fluis. Small amount of fibrinous drainage-c/s sent. Open area has healthy appearing tissue. If culture grows any bacteria will initiate antibiotics. Pt reassured. Keep area clean and covered with dry gauze.  PCP: Anola Gurneyobert Chauvin

## 2015-05-08 NOTE — Patient Instructions (Signed)
Patient to return as scheduled.  

## 2015-05-11 LAB — ANAEROBIC AND AEROBIC CULTURE

## 2015-05-12 ENCOUNTER — Telehealth: Payer: Self-pay | Admitting: *Deleted

## 2015-05-12 ENCOUNTER — Encounter: Payer: Self-pay | Admitting: General Surgery

## 2015-05-12 NOTE — Telephone Encounter (Signed)
Notified patient as instructed, patient pleased. Discussed follow-up appointments, patient agrees  

## 2015-05-12 NOTE — Telephone Encounter (Signed)
-----   Message from Kieth BrightlySeeplaputhur G Sankar, MD sent at 05/12/2015  8:20 AM EDT ----- Inform pt no growth on culture.F/u as scheduled

## 2015-05-12 NOTE — Progress Notes (Signed)
Quick Note:  Inform pt no growth on culture.F/u as scheduled ______

## 2015-05-14 ENCOUNTER — Ambulatory Visit (INDEPENDENT_AMBULATORY_CARE_PROVIDER_SITE_OTHER): Payer: Medicaid Other | Admitting: General Surgery

## 2015-05-14 ENCOUNTER — Encounter: Payer: Self-pay | Admitting: General Surgery

## 2015-05-14 ENCOUNTER — Encounter: Payer: Self-pay | Admitting: *Deleted

## 2015-05-14 VITALS — BP 124/74 | HR 80 | Resp 12 | Ht 62.0 in | Wt 151.0 lb

## 2015-05-14 DIAGNOSIS — L0591 Pilonidal cyst without abscess: Secondary | ICD-10-CM

## 2015-05-14 DIAGNOSIS — L0592 Pilonidal sinus without abscess: Secondary | ICD-10-CM

## 2015-05-14 NOTE — Progress Notes (Signed)
Patient is here today for her post op pilonidal cyst excision done on 04/10/15. Culture obtain on 05-08-15 showed no growth.  Patient states the are is very sore. The open area appears clean and to be healing well, reduced in size.   Follow up in 2 weeks.

## 2015-05-14 NOTE — Patient Instructions (Addendum)
The patient is aware to call back for any questions or concerns. Follow up in 2 weeks.  

## 2015-05-15 ENCOUNTER — Telehealth: Payer: Self-pay | Admitting: Family Medicine

## 2015-05-15 NOTE — Telephone Encounter (Signed)
Patient called off with concerns of depressed mood that has been continuous for years. She states that recently symptoms have worsened and she is unsure why she states that when she is with her peers she goes from happy to sad, her peers and family has noticed mood changes in patient. Patient states that her eyes feel heavy a lot and feeling blue, patient has had a past history of suicidal ideation/attempt she denied having those feelings or symptoms now. Patient scheduled at 2:45PM tomorrow.

## 2015-05-16 ENCOUNTER — Encounter: Payer: Self-pay | Admitting: Family Medicine

## 2015-05-16 ENCOUNTER — Ambulatory Visit (INDEPENDENT_AMBULATORY_CARE_PROVIDER_SITE_OTHER): Payer: Medicaid Other | Admitting: Family Medicine

## 2015-05-16 VITALS — BP 100/78 | HR 86 | Temp 99.2°F | Resp 16 | Ht 62.0 in | Wt 151.4 lb

## 2015-05-16 DIAGNOSIS — F329 Major depressive disorder, single episode, unspecified: Secondary | ICD-10-CM

## 2015-05-16 DIAGNOSIS — F32A Depression, unspecified: Secondary | ICD-10-CM

## 2015-05-16 MED ORDER — SERTRALINE HCL 50 MG PO TABS: 50.0000 mg | ORAL_TABLET | Freq: Every day | ORAL | Status: DC

## 2015-05-16 NOTE — Patient Instructions (Signed)
Let me now if you can't tolerate the medication before your f/u appointment.
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
## 2015-05-19 ENCOUNTER — Encounter: Payer: Self-pay | Admitting: General Surgery

## 2015-05-19 ENCOUNTER — Telehealth: Payer: Self-pay | Admitting: Family Medicine

## 2015-05-19 NOTE — Telephone Encounter (Signed)
Pt stated that she thinks Zoloft caused her to have diarrhea the first day she took the medication but now she is having stomach pains and isn't able to have a BM. Pt stated that Nadine Counts told her if she has any problems with the medication she should call the office.Thanks TNP

## 2015-05-19 NOTE — Telephone Encounter (Signed)
Patient has been instructed as below, advised to call back if symptoms do not get clear or worsen. KW

## 2015-05-19 NOTE — Telephone Encounter (Signed)
Did she start with 1/2 pill? If so, wait until she feels better then resume at 1/4 pll and slowly advance to 1/2 pill if tolerated.

## 2015-05-19 NOTE — Telephone Encounter (Signed)
Please advise 

## 2015-05-27 ENCOUNTER — Ambulatory Visit: Payer: Medicaid Other | Admitting: General Surgery

## 2015-05-27 ENCOUNTER — Ambulatory Visit (INDEPENDENT_AMBULATORY_CARE_PROVIDER_SITE_OTHER): Payer: Medicaid Other | Admitting: General Surgery

## 2015-05-27 ENCOUNTER — Encounter: Payer: Self-pay | Admitting: General Surgery

## 2015-05-27 VITALS — BP 116/74 | HR 80 | Resp 12 | Ht 62.0 in | Wt 160.0 lb

## 2015-05-27 DIAGNOSIS — L0591 Pilonidal cyst without abscess: Secondary | ICD-10-CM

## 2015-05-27 DIAGNOSIS — L0592 Pilonidal sinus without abscess: Secondary | ICD-10-CM

## 2015-05-27 NOTE — Patient Instructions (Addendum)
The patient is aware to call back for any questions or concerns.  Follow up in one month.  

## 2015-05-27 NOTE — Progress Notes (Signed)
Here today for postoperative visit, pilonidal cyst done 04-10-15. She states she is doing well. Area is showing good healing. The upper third of incision is healed and lower 1/3 is clean  with healthy granulation and shallow.  Follow up in one month.   PCP:  Anola Gurney

## 2015-05-28 DIAGNOSIS — R51 Headache: Secondary | ICD-10-CM

## 2015-05-28 DIAGNOSIS — R519 Headache, unspecified: Secondary | ICD-10-CM | POA: Insufficient documentation

## 2015-05-30 ENCOUNTER — Ambulatory Visit: Payer: Medicaid Other | Admitting: Family Medicine

## 2015-05-31 ENCOUNTER — Encounter (HOSPITAL_COMMUNITY): Payer: Self-pay | Admitting: *Deleted

## 2015-05-31 ENCOUNTER — Emergency Department (HOSPITAL_COMMUNITY)
Admission: EM | Admit: 2015-05-31 | Discharge: 2015-05-31 | Disposition: A | Discharge status: Home / Self Care | Payer: Medicaid Other | Attending: Emergency Medicine | Admitting: Emergency Medicine

## 2015-05-31 DIAGNOSIS — Z3202 Encounter for pregnancy test, result negative: Secondary | ICD-10-CM | POA: Diagnosis not present

## 2015-05-31 DIAGNOSIS — R195 Other fecal abnormalities: Secondary | ICD-10-CM | POA: Diagnosis not present

## 2015-05-31 DIAGNOSIS — K2970 Gastritis, unspecified, without bleeding: Secondary | ICD-10-CM | POA: Insufficient documentation

## 2015-05-31 DIAGNOSIS — F329 Major depressive disorder, single episode, unspecified: Secondary | ICD-10-CM | POA: Diagnosis not present

## 2015-05-31 DIAGNOSIS — Z79899 Other long term (current) drug therapy: Secondary | ICD-10-CM | POA: Insufficient documentation

## 2015-05-31 DIAGNOSIS — K625 Hemorrhage of anus and rectum: Secondary | ICD-10-CM | POA: Diagnosis present

## 2015-05-31 DIAGNOSIS — J45909 Unspecified asthma, uncomplicated: Secondary | ICD-10-CM | POA: Diagnosis not present

## 2015-05-31 DIAGNOSIS — F419 Anxiety disorder, unspecified: Secondary | ICD-10-CM | POA: Diagnosis not present

## 2015-05-31 LAB — COMPREHENSIVE METABOLIC PANEL
ALK PHOS: 31 U/L — AB (ref 38–126)
ALT: 21 U/L (ref 14–54)
AST: 23 U/L (ref 15–41)
Albumin: 4.2 g/dL (ref 3.5–5.0)
Anion gap: 8 (ref 5–15)
BUN: 6 mg/dL (ref 6–20)
CO2: 23 mmol/L (ref 22–32)
Calcium: 9.3 mg/dL (ref 8.9–10.3)
Chloride: 106 mmol/L (ref 101–111)
Creatinine, Ser: 0.66 mg/dL (ref 0.44–1.00)
GFR calc Af Amer: >60 mL/min (ref >60)
Glucose, Bld: 94 mg/dL (ref 65–99)
Potassium: 3.3 mmol/L — ABNORMAL LOW (ref 3.5–5.1)
Sodium: 137 mmol/L (ref 135–145)
Total Bilirubin: 0.4 mg/dL (ref 0.3–1.2)
Total Protein: 7.2 g/dL (ref 6.5–8.1)

## 2015-05-31 LAB — CBC
HCT: 39 % (ref 36.0–46.0)
HEMOGLOBIN: 13.3 g/dL (ref 12.0–15.0)
MCH: 29.4 pg (ref 26.0–34.0)
MCHC: 34.1 g/dL (ref 30.0–36.0)
MCV: 86.3 fL (ref 78.0–100.0)
PLATELETS: 296 10*3/uL (ref 150–400)
RBC: 4.52 MIL/uL (ref 3.87–5.11)
RDW: 12.5 % (ref 11.5–15.5)
WBC: 7.5 10*3/uL (ref 4.0–10.5)

## 2015-05-31 LAB — POC OCCULT BLOOD, ED: Fecal Occult Bld: POSITIVE — AB

## 2015-05-31 LAB — URINALYSIS, ROUTINE W REFLEX MICROSCOPIC
Bilirubin Urine: NEGATIVE
Glucose, UA: NEGATIVE mg/dL
Ketones, ur: NEGATIVE mg/dL
Nitrite: NEGATIVE
Protein, ur: NEGATIVE mg/dL
Specific Gravity, Urine: 1.009 (ref 1.005–1.030)
UROBILINOGEN UA: 0.2 mg/dL (ref 0.0–1.0)
pH: 6.5 (ref 5.0–8.0)

## 2015-05-31 LAB — URINE MICROSCOPIC-ADD ON

## 2015-05-31 LAB — I-STAT BETA HCG BLOOD, ED (MC, WL, AP ONLY)

## 2015-05-31 LAB — LIPASE, BLOOD: LIPASE: 18 U/L — AB (ref 22–51)

## 2015-05-31 MED ORDER — OMEPRAZOLE 20 MG PO CPDR
20.0000 mg | DELAYED_RELEASE_CAPSULE | Freq: Every day | ORAL | Status: DC
Start: 2015-05-31 — End: 2015-07-02

## 2015-05-31 MED ORDER — SUCRALFATE 1 GM/10ML PO SUSP: 1.0000 g | Freq: Three times a day (TID) | ORAL | Status: DC

## 2015-05-31 NOTE — ED Notes (Signed)
Pt reports drinking etoh last week, having n/v afterwards. Since then having bright red blood in stools, "taste of iron in her mouth" and n/v. Denies diarrhea.

## 2015-05-31 NOTE — Discharge Instructions (Signed)
Bloody Stools Bloody stools often mean that there is a problem in the digestive tract. Your caregiver may use the term "melena" to describe black, tarry, and bad smelling stools or "hematochezia" to describe red or maroon-colored stools. Blood seen in the stool can be caused by bleeding anywhere along the intestinal tract.  A black stool usually means that blood is coming from the upper part of the gastrointestinal tract (esophagus, stomach, or small bowel). Passing maroon-colored stools or bright red blood usually means that blood is coming from lower down in the large bowel or the rectum. However, sometimes massive bleeding in the stomach or small intestine can cause bright red bloody stools.  Consuming black licorice, lead, iron pills, medicines containing bismuth subsalicylate, or blueberries can also cause black stools. Your caregiver can test black stools to see if blood is present. It is important that the cause of the bleeding be found. Treatment can then be started, and the problem can be corrected. Rectal bleeding may not be serious, but you should not assume everything is okay until you know the cause.It is very important to follow up with your caregiver or a specialist in gastrointestinal problems. CAUSES  Blood in the stools can come from various underlying causes.Often, the cause is not found during your first visit. Testing is often needed to discover the cause of bleeding in the gastrointestinal tract. Causes range from simple to serious or even life-threatening.Possible causes include:  Hemorrhoids.These are veins that are full of blood (engorged) in the rectum. They cause pain, inflammation, and may bleed.  Anal fissures.These are areas of painful tearing which may bleed. They are often caused by passing hard stool.  Diverticulosis.These are pouches that form on the colon over time, with age, and may bleed significantly.  Diverticulitis.This is inflammation in areas with  diverticulosis. It can cause pain, fever, and bloody stools, although bleeding is rare.  Proctitis and colitis. These are inflamed areas of the rectum or colon. They may cause pain, fever, and bloody stools.  Polyps and cancer. Colon cancer is a leading cause of preventable cancer death.It often starts out as precancerous polyps that can be removed during a colonoscopy, preventing progression into cancer. Sometimes, polyps and cancer may cause rectal bleeding.  Gastritis and ulcers.Bleeding from the upper gastrointestinal tract (near the stomach) may travel through the intestines and produce black, sometimes tarry, often bad smelling stools. In certain cases, if the bleeding is fast enough, the stools may not be black, but red and the condition may be life-threatening. SYMPTOMS  You may have stools that are bright red and bloody, that are normal color with blood on them, or that are dark black and tarry. In some cases, you may only have blood in the toilet bowl. Any of these cases need medical care. You may also have:  Pain at the anus or anywhere in the rectum.  Lightheadedness or feeling faint.  Extreme weakness.  Nausea or vomiting.  Fever. DIAGNOSIS Your caregiver may use the following methods to find the cause of your bleeding:  Taking a medical history. Age is important. Older people tend to develop polyps and cancer more often. If there is anal pain and a hard, large stool associated with bleeding, a tear of the anus may be the cause. If blood drips into the toilet after a bowel movement, bleeding hemorrhoids may be the problem. The color and frequency of the bleeding are additional considerations. In most cases, the medical history provides clues, but seldom the final  answer.  A visual and finger (digital) exam. Your caregiver will inspect the anal area, looking for tears and hemorrhoids. A finger exam can provide information when there is tenderness or a growth inside. In men, the  prostate is also examined.  Endoscopy. Several types of small, long scopes (endoscopes) are used to view the colon.  In the office, your caregiver may use a rigid, or more commonly, a flexible viewing sigmoidoscope. This exam is called flexible sigmoidoscopy. It is performed in 5 to 10 minutes.  A more thorough exam is accomplished with a colonoscope. It allows your caregiver to view the entire 5 to 6 foot long colon. Medicine to help you relax (sedative) is usually given for this exam. Frequently, a bleeding lesion may be present beyond the reach of the sigmoidoscope. So, a colonoscopy may be the best exam to start with. Both exams are usually done on an outpatient basis. This means the patient does not stay overnight in the hospital or surgery center.  An upper endoscopy may be needed to examine your stomach. Sedation is used and a flexible endoscope is put in your mouth, down to your stomach.  A barium enema X-ray. This is an X-ray exam. It uses liquid barium inserted by enema into the rectum. This test alone may not identify an actual bleeding point. X-rays highlight abnormal shadows, such as those made by lumps (tumors), diverticuli, or colitis. TREATMENT  Treatment depends on the cause of your bleeding.   For bleeding from the stomach or colon, the caregiver doing your endoscopy or colonoscopy may be able to stop the bleeding as part of the procedure.  Inflammation or infection of the colon can be treated with medicines.  Many rectal problems can be treated with creams, suppositories, or warm baths.  Surgery is sometimes needed.  Blood transfusions are sometimes needed if you have lost a lot of blood.  For any bleeding problem, let your caregiver know if you take aspirin or other blood thinners regularly. HOME CARE INSTRUCTIONS   Take any medicines exactly as prescribed.  Keep your stools soft by eating a diet high in fiber. Prunes (1 to 3 a day) work well for many people.  Drink  enough water and fluids to keep your urine clear or pale yellow.  Take sitz baths if advised. A sitz bath is when you sit in a bathtub with warm water for 10 to 15 minutes to soak, soothe, and cleanse the rectal area.  If enemas or suppositories are advised, be sure you know how to use them. Tell your caregiver if you have problems with this.  Monitor your bowel movements to look for signs of improvement or worsening. SEEK MEDICAL CARE IF:   You do not improve in the time expected.  Your condition worsens after initial improvement.  You develop any new symptoms. SEEK IMMEDIATE MEDICAL CARE IF:   You develop severe or prolonged rectal bleeding.  You vomit blood.  You feel weak or faint.  You have a fever. MAKE SURE YOU:  Understand these instructions.  Will watch your condition.  Will get help right away if you are not doing well or get worse. Document Released: 10/01/2002 Document Revised: 01/03/2012 Document Reviewed: 02/26/2011 [facility] Patient Information 2015 [place], [place]. This information is not intended to replace advice given to you by your health care provider. Make sure you discuss any questions you have with your health care provider.  Gastritis, Adult Gastritis is soreness and swelling (inflammation) of the lining of  the stomach. Gastritis can develop as a sudden onset (acute) or long-term (chronic) condition. If gastritis is not treated, it can lead to stomach bleeding and ulcers. CAUSES  Gastritis occurs when the stomach lining is weak or damaged. Digestive juices from the stomach then inflame the weakened stomach lining. The stomach lining may be weak or damaged due to viral or bacterial infections. One common bacterial infection is the Helicobacter pylori infection. Gastritis can also result from excessive alcohol consumption, taking certain medicines, or having too much acid in the stomach.  SYMPTOMS  In some cases, there are no symptoms. When symptoms are  present, they may include:  Pain or a burning sensation in the upper abdomen.  Nausea.  Vomiting.  An uncomfortable feeling of fullness after eating. DIAGNOSIS  Your caregiver may suspect you have gastritis based on your symptoms and a physical exam. To determine the cause of your gastritis, your caregiver may perform the following:  Blood or stool tests to check for the H pylori bacterium.  Gastroscopy. A thin, flexible tube (endoscope) is passed down the esophagus and into the stomach. The endoscope has a light and camera on the end. Your caregiver uses the endoscope to view the inside of the stomach.  Taking a tissue sample (biopsy) from the stomach to examine under a microscope. TREATMENT  Depending on the cause of your gastritis, medicines may be prescribed. If you have a bacterial infection, such as an H pylori infection, antibiotics may be given. If your gastritis is caused by too much acid in the stomach, H2 blockers or antacids may be given. Your caregiver may recommend that you stop taking aspirin, ibuprofen, or other nonsteroidal anti-inflammatory drugs (NSAIDs). HOME CARE INSTRUCTIONS  Only take over-the-counter or prescription medicines as directed by your caregiver.  If you were given antibiotic medicines, take them as directed. Finish them even if you start to feel better.  Drink enough fluids to keep your urine clear or pale yellow.  Avoid foods and drinks that make your symptoms worse, such as:  Caffeine or alcoholic drinks.  Chocolate.  Peppermint or mint flavorings.  Garlic and onions.  Spicy foods.  Citrus fruits, such as oranges, lemons, or limes.  Tomato-based foods such as sauce, chili, salsa, and pizza.  Fried and fatty foods.  Eat small, frequent meals instead of large meals. SEEK IMMEDIATE MEDICAL CARE IF:   You have black or dark red stools.  You vomit blood or material that looks like coffee grounds.  You are unable to keep fluids  down.  Your abdominal pain gets worse.  You have a fever.  You do not feel better after 1 week.  You have any other questions or concerns. MAKE SURE YOU:  Understand these instructions.  Will watch your condition.  Will get help right away if you are not doing well or get worse. Document Released: 10/05/2001 Document Revised: 04/11/2012 Document Reviewed: 11/24/2011 [facility] Patient Information 2015 [place], [place]. This information is not intended to replace advice given to you by your health care provider. Make sure you discuss any questions you have with your health care provider.

## 2015-05-31 NOTE — ED Provider Notes (Signed)
CSN: [id number]     Arrival date & time 05/31/15  1520 History   First MD Initiated Contact with Patient 05/31/15 1530     Chief Complaint  Patient presents with  . Rectal Bleeding  . Emesis     (Consider location/radiation/quality/duration/timing/severity/associated sxs/prior Treatment) HPI    PCP: Anola Gurney, PA Blood pressure 139/71, pulse 87, temperature 97.8 F (36.6 C), temperature source Oral, resp. rate 18, height  (1.575 m), weight 151 lb 9 oz (68.748 kg), last menstrual period 05/14/2015, SpO2 98 %.  Madison Dixon is a 19 y.o.female with a significant PMH of pilonidal cyst, GERD, asthma, depression, and anxiety presents to the ER with complaints of rectal bleeding, metallic taste in her mouth and feeling dehydrated. She drinks alcohol once a month and 1 week ago preceding she uncharacteristically had a significant amount of liquor and juice, then had many episodes of vomiting and has felt poorly since. She denies noticing blood coming from anywhere else besides her stool x 1. She noticed the toilet bowel was bright red. She has been constipated lately due to Zoloft. She is not actively bleeding when not having a bowel movement  The patient denies diaphoresis, fever, headache, weakness (general or focal), confusion, change of vision,  neck pain, dysphagia, aphagia, chest pain, shortness of breath, back pain, abdominal pains, nausea, vomiting, diarrhea, lower extremity swelling, rash.  Past Medical History  Diagnosis Date  . Pilonidal cyst   . Asthma     smoke induced  . Depression   . Anxiety    Past Surgical History  Procedure Laterality Date  . Pilonidal cyst excision N/A 04/10/2015    Procedure: CYST EXCISION PILONIDAL EXTENSIVE;  Surgeon: Kieth Brightly, MD;  Location: [facility] ORS;  Service: General;  Laterality: N/A;   Family History  Problem Relation Age of Onset  . Hyperlipidemia Father   . GER disease Father    History  Substance Use Topics  .  Smoking status: Never Smoker   . Smokeless tobacco: Never Used  . Alcohol Use: No   OB History    Gravida Para Term Preterm AB TAB SAB Ectopic Multiple Living        Obstetric Comments   1st Menstrual Cycle:  11      Review of Systems  10 Systems reviewed and are negative for acute change except as noted in the HPI.   Allergies  Review of patient's allergies indicates no known allergies.  Home Medications   Prior to Admission medications   Medication Sig Start Date End Date Taking? Authorizing Provider  etonogestrel (NEXPLANON) 68 MG IMPL implant Inject into the skin.   Yes Historical Provider, MD  sertraline (ZOLOFT) 50 MG tablet Take 1 tablet (50 mg total) by mouth daily. Take one-half pill for the first 5 days Patient taking differently: Take 50 mg by mouth daily.  05/16/15  Yes Anola Gurney, PA  amoxicillin (AMOXIL) 875 MG tablet Take 1 tablet (875 mg total) by mouth 2 (two) times daily. Patient not taking: Reported on 05/31/2015 05/08/15   Seeplaputhur Wynona Luna, MD  omeprazole (PRILOSEC) 20 MG capsule Take 1 capsule (20 mg total) by mouth daily. 05/31/15   Reza Crymes Neva Seat, PA-C  ondansetron (ZOFRAN) 4 MG tablet Take 1 tablet (4 mg total) by mouth every 8 (eight) hours as needed for nausea or vomiting. 05/08/15   Seeplaputhur Wynona Luna, MD  oxyCODONE-acetaminophen (ROXICET) 5-325 MG per tablet Take 1 tablet  by mouth every 4 (four) hours as needed for severe pain. 04/10/15   Seeplaputhur Wynona Luna, MD  sucralfate (CARAFATE) 1 GM/10ML suspension Take 10 mLs (1 g total) by mouth 4 (four) times daily -  with meals and at bedtime. 05/31/15   Nysir Fergusson Neva Seat, PA-C  traMADol (ULTRAM) 50 MG tablet TAKE 1 TABLET BY MOUTH 6 HOURS AS NEEDED FOR PAIN 05/08/15   Historical Provider, MD   BP 127/63 mmHg  Pulse 77  Temp(Src) 97.8 F (36.6 C) (Oral)  Resp 18  Ht 5\' 2"  (1.575 m)  Wt 151 lb 9 oz (68.748 kg)  BMI 27.71 kg/m2  SpO2 98%  LMP 05/14/2015 Physical Exam   Constitutional: She appears well-developed and well-nourished. No distress.  HENT:  Head: Normocephalic and atraumatic.  Eyes: Pupils are equal, round, and reactive to light.  Neck: Normal range of motion. Neck supple.  Cardiovascular: Normal rate and regular rhythm.   Pulmonary/Chest: Effort normal.  Abdominal: Soft.  Genitourinary: Rectal exam shows no external hemorrhoid and no internal hemorrhoid. Guaiac positive stool (dark brown stool in rectal vault).  Light colored stool in rectal vault. No active bleeding.  Neurological: She is alert.  Skin: Skin is warm and dry.  Nursing note and vitals reviewed.   ED Course  Procedures (including critical care time) Labs Review Labs Reviewed  LIPASE, BLOOD - Abnormal; Notable for the following:    Lipase 18 (*)    All other components within normal limits  COMPREHENSIVE METABOLIC PANEL - Abnormal; Notable for the following:    Potassium 3.3 (*)    Alkaline Phosphatase 31 (*)    All other components within normal limits  URINALYSIS, ROUTINE W REFLEX MICROSCOPIC (NOT AT [facility]) - Abnormal; Notable for the following:    APPearance CLOUDY (*)    Hgb urine dipstick TRACE (*)    Leukocytes, UA SMALL (*)    All other components within normal limits  POC OCCULT BLOOD, ED - Abnormal; Notable for the following:    Fecal Occult Bld POSITIVE (*)    All other components within normal limits  CBC  URINE MICROSCOPIC-ADD ON  I-STAT BETA HCG BLOOD, ED ([facility], [facility], [facility] ONLY)    Imaging Review No results found.   EKG Interpretation None      MDM   Final diagnoses:  Gastritis  Heme positive stool   Normal vitals, urine shows small amount of hemoglobin (currently menstruating) Neg pregnancy, CMP and CBC are unremarkable. Patient most likely has gastritis will cover with carafate and prilosec In regards to rectal bleeding will start on stool softners and refer to GI.  Medications - No data to display  19 y.o.Madison Dixon's evaluation in  the Emergency Department is complete. It has been determined that no acute conditions requiring further emergency intervention are present at this time. The patient/guardian have been advised of the diagnosis and plan. We have discussed signs and symptoms that warrant return to the ED, such as changes or worsening in symptoms.  Vital signs are stable at discharge. Filed Vitals:   05/31/15 1815  BP: 127/63  Pulse: 77  Temp:   Resp:     Patient/guardian has voiced understanding and agreed to follow-up with the PCP or specialist.     Marlon Pel, PA-C 05/31/15 1914  Elwin Mocha, MD 05/31/15 2314

## 2015-06-05 ENCOUNTER — Ambulatory Visit: Payer: Medicaid Other | Admitting: Family Medicine

## 2015-06-06 ENCOUNTER — Ambulatory Visit: Payer: Medicaid Other | Admitting: Family Medicine

## 2015-06-16 ENCOUNTER — Ambulatory Visit: Payer: Medicaid Other | Admitting: Family Medicine

## 2015-07-01 ENCOUNTER — Ambulatory Visit (INDEPENDENT_AMBULATORY_CARE_PROVIDER_SITE_OTHER): Payer: Medicaid Other | Admitting: Family Medicine

## 2015-07-01 ENCOUNTER — Encounter: Payer: Self-pay | Admitting: Family Medicine

## 2015-07-01 VITALS — BP 104/70 | HR 65 | Temp 98.3°F | Resp 16 | Ht 62.0 in | Wt 156.4 lb

## 2015-07-01 DIAGNOSIS — F32A Depression, unspecified: Secondary | ICD-10-CM

## 2015-07-01 DIAGNOSIS — F329 Major depressive disorder, single episode, unspecified: Secondary | ICD-10-CM | POA: Diagnosis not present

## 2015-07-01 MED ORDER — SERTRALINE HCL 50 MG PO TABS: ORAL_TABLET | ORAL | Status: DC

## 2015-07-01 NOTE — Patient Instructions (Signed)
Come in or call in two weeks.

## 2015-07-01 NOTE — Progress Notes (Signed)
Subjective:     Patient ID: Madison Dixon, female   DOB: 02-25-1996, 19 y.o.   MRN: [id number]  HPI  Chief Complaint  Patient presents with  . Depression    Patient is present in office today to follow up on 7/22, patient was prescribed Zoloft . Patient reports that she has noticed little change since being on medication she states that peers have stated they noticed positive change in her. Patient reports that she is doing better and will be moving in to her own place soon with her boyfriend.   States she ran out of medication 2 days ago (one whole pill daily) Wishes to stay on medication. Reports site of pilonidal incision is still healed and has f/u with her surgeon pending.   Review of Systems  Psychiatric/Behavioral: Negative for suicidal ideas.       Objective:   Physical Exam  Constitutional: She appears well-developed and well-nourished. No distress.  Psychiatric: She has a normal mood and affect. Her behavior is normal.  Pleasant and smiling       Assessment:    1. Depression: improving with treatment - sertraline (ZOLOFT) 50 MG tablet; Take one and 1/2 pills daily  Dispense: 45 tablet; Refill: 1    Plan:   f/u in two weeks. Discussed stopping medication if feeling suicidal while taking.

## 2015-07-02 ENCOUNTER — Encounter: Payer: Self-pay | Admitting: General Surgery

## 2015-07-02 ENCOUNTER — Ambulatory Visit (INDEPENDENT_AMBULATORY_CARE_PROVIDER_SITE_OTHER): Payer: Medicaid Other | Admitting: General Surgery

## 2015-07-02 VITALS — BP 112/68 | HR 76 | Resp 12 | Ht 62.0 in | Wt 157.0 lb

## 2015-07-02 DIAGNOSIS — L0591 Pilonidal cyst without abscess: Secondary | ICD-10-CM

## 2015-07-02 DIAGNOSIS — L0592 Pilonidal sinus without abscess: Secondary | ICD-10-CM

## 2015-07-02 NOTE — Patient Instructions (Signed)
The patient is aware to call back for any questions or concerns.  

## 2015-07-02 NOTE — Progress Notes (Signed)
Here today for postoperative visit, pilonidal cyst done 04-10-15. She states she is doing well with some drainage and bleeding noted. She states it is itching more.   Small residual opening, silver nitrate used to the area.  Fllow up next week with the nurse for repeat silver nitrate. Follow up with MD in 2 weeks.  PCP:  Toni Arthurs

## 2015-07-07 ENCOUNTER — Encounter: Payer: Self-pay | Admitting: General Surgery

## 2015-07-08 ENCOUNTER — Ambulatory Visit (INDEPENDENT_AMBULATORY_CARE_PROVIDER_SITE_OTHER): Payer: Medicaid Other | Admitting: *Deleted

## 2015-07-08 DIAGNOSIS — L0592 Pilonidal sinus without abscess: Secondary | ICD-10-CM

## 2015-07-08 DIAGNOSIS — L0591 Pilonidal cyst without abscess: Secondary | ICD-10-CM

## 2015-07-08 NOTE — Patient Instructions (Signed)
May take 2 aleve twice a day for pain

## 2015-07-08 NOTE — Progress Notes (Signed)
Patient came in today for a wound check/pilonidal sinus.  The wound is clean, with no signs of infection noted. She states that she did have some bleeding on Friday. Silver nitrate used to the area. Follow up as scheduled next week. Recommend 2 Aleve twice a day for the discomfort.

## 2015-07-15 ENCOUNTER — Ambulatory Visit: Payer: Medicaid Other | Admitting: Family Medicine

## 2015-07-16 ENCOUNTER — Ambulatory Visit: Payer: Medicaid Other | Admitting: General Surgery

## 2015-07-18 ENCOUNTER — Ambulatory Visit: Payer: Medicaid Other | Admitting: Family Medicine

## 2015-07-31 ENCOUNTER — Ambulatory Visit: Payer: Medicaid Other | Admitting: Family Medicine

## 2015-08-01 ENCOUNTER — Emergency Department
Admission: EM | Admit: 2015-08-01 | Discharge: 2015-08-01 | Disposition: A | Discharge status: Home / Self Care | Payer: Medicaid Other | Attending: Emergency Medicine | Admitting: Emergency Medicine

## 2015-08-01 ENCOUNTER — Encounter: Payer: Self-pay | Admitting: *Deleted

## 2015-08-01 DIAGNOSIS — R42 Dizziness and giddiness: Secondary | ICD-10-CM | POA: Diagnosis not present

## 2015-08-01 DIAGNOSIS — Z3202 Encounter for pregnancy test, result negative: Secondary | ICD-10-CM | POA: Diagnosis not present

## 2015-08-01 DIAGNOSIS — R112 Nausea with vomiting, unspecified: Secondary | ICD-10-CM | POA: Diagnosis not present

## 2015-08-01 DIAGNOSIS — R531 Weakness: Secondary | ICD-10-CM | POA: Diagnosis not present

## 2015-08-01 LAB — URINALYSIS COMPLETE WITH MICROSCOPIC (ARMC ONLY)
BILIRUBIN URINE: NEGATIVE
Bacteria, UA: NONE SEEN
Glucose, UA: NEGATIVE mg/dL
KETONES UR: NEGATIVE mg/dL
Nitrite: NEGATIVE
PH: 6 (ref 5.0–8.0)
PROTEIN: NEGATIVE mg/dL
Specific Gravity, Urine: 1.024 (ref 1.005–1.030)

## 2015-08-01 LAB — COMPREHENSIVE METABOLIC PANEL
ALK PHOS: 38 U/L (ref 38–126)
ALT: 18 U/L (ref 14–54)
AST: 19 U/L (ref 15–41)
Albumin: 4.6 g/dL (ref 3.5–5.0)
Anion gap: 10 (ref 5–15)
BUN: 12 mg/dL (ref 6–20)
CALCIUM: 9.6 mg/dL (ref 8.9–10.3)
CO2: 24 mmol/L (ref 22–32)
CREATININE: 0.58 mg/dL (ref 0.44–1.00)
Chloride: 106 mmol/L (ref 101–111)
Glucose, Bld: 93 mg/dL (ref 65–99)
Potassium: 3.6 mmol/L (ref 3.5–5.1)
Sodium: 140 mmol/L (ref 135–145)
Total Bilirubin: 0.4 mg/dL (ref 0.3–1.2)
Total Protein: 7.5 g/dL (ref 6.5–8.1)

## 2015-08-01 LAB — CBC WITH DIFFERENTIAL/PLATELET
Basophils Absolute: 0.1 10*3/uL (ref 0–0.1)
Basophils Relative: 1 %
Eosinophils Absolute: 0.1 10*3/uL (ref 0–0.7)
Eosinophils Relative: 1 %
HEMATOCRIT: 39.4 % (ref 35.0–47.0)
HEMOGLOBIN: 13 g/dL (ref 12.0–16.0)
LYMPHS ABS: 2.4 10*3/uL (ref 1.0–3.6)
LYMPHS PCT: 27 %
MCH: 28.7 pg (ref 26.0–34.0)
MCHC: 33.1 g/dL (ref 32.0–36.0)
MCV: 86.8 fL (ref 80.0–100.0)
Monocytes Absolute: 0.6 10*3/uL (ref 0.2–0.9)
Monocytes Relative: 7 %
NEUTROS ABS: 5.5 10*3/uL (ref 1.4–6.5)
NEUTROS PCT: 64 %
Platelets: 298 10*3/uL (ref 150–440)
RBC: 4.54 MIL/uL (ref 3.80–5.20)
RDW: 12.9 % (ref 11.5–14.5)
WBC: 8.7 10*3/uL (ref 3.6–11.0)

## 2015-08-01 LAB — POCT PREGNANCY, URINE: Preg Test, Ur: NEGATIVE

## 2015-08-01 LAB — LIPASE, BLOOD: LIPASE: 17 U/L — AB (ref 22–51)

## 2015-08-01 MED ORDER — FAMOTIDINE IN NACL 20-0.9 MG/50ML-% IV SOLN
20.0000 mg | Freq: Once | INTRAVENOUS | Status: AC
  Administered 2015-08-01: 20 mg via INTRAVENOUS
  Filled 2015-08-01: qty 50

## 2015-08-01 MED ORDER — DICYCLOMINE HCL 20 MG PO TABS: 9.0000 mg | ORAL_TABLET | Freq: Three times a day (TID) | ORAL | Status: DC | PRN

## 2015-08-01 MED ORDER — FAMOTIDINE 20 MG PO TABS: 20.0000 mg | ORAL_TABLET | Freq: Every day | ORAL | Status: DC

## 2015-08-01 MED ORDER — ONDANSETRON HCL 4 MG PO TABS: 4.0000 mg | ORAL_TABLET | Freq: Every day | ORAL | Status: DC | PRN

## 2015-08-01 MED ORDER — ONDANSETRON HCL 4 MG/2ML IJ SOLN
4.0000 mg | Freq: Once | INTRAMUSCULAR | Status: AC
  Administered 2015-08-01: 4 mg via INTRAVENOUS
  Filled 2015-08-01: qty 2

## 2015-08-01 MED ORDER — SODIUM CHLORIDE 0.9 % IV SOLN
Freq: Once | INTRAVENOUS | Status: AC
  Administered 2015-08-01: 18:00:00 via INTRAVENOUS

## 2015-08-01 NOTE — Discharge Instructions (Signed)
Dizziness Dizziness is a common problem. It is a feeling of unsteadiness or light-headedness. You may feel like you are about to faint. Dizziness can lead to injury if you stumble or fall. Anyone can become dizzy, but dizziness is more common in older adults. This condition can be caused by a number of things, including medicines, dehydration, or illness. HOME CARE INSTRUCTIONS Taking these steps may help with your condition: Eating and Drinking  Drink enough fluid to keep your urine clear or pale yellow. This helps to keep you from becoming dehydrated. Try to drink more clear fluids, such as water.  Do not drink alcohol.  Limit your caffeine intake if directed by your health care provider.  Limit your salt intake if directed by your health care provider. Activity  Avoid making quick movements.  Rise slowly from chairs and steady yourself until you feel okay.  In the morning, first sit up on the side of the bed. When you feel okay, stand slowly while you hold onto something until you know that your balance is fine.  Move your legs often if you need to stand in one place for a long time. Tighten and relax your muscles in your legs while you are standing.  Do not drive or operate heavy machinery if you feel dizzy.  Avoid bending down if you feel dizzy. Place items in your home so that they are easy for you to reach without leaning over. Lifestyle  Do not use any tobacco products, including cigarettes, chewing tobacco, or electronic cigarettes. If you need help quitting, ask your health care provider.  Try to reduce your stress level, such as with yoga or meditation. Talk with your health care provider if you need help. General Instructions  Watch your dizziness for any changes.  Take medicines only as directed by your health care provider. Talk with your health care provider if you think that your dizziness is caused by a medicine that you are taking.  Tell a friend or a family  member that you are feeling dizzy. If he or she notices any changes in your behavior, have this person call your health care provider.  Keep all follow-up visits as directed by your health care provider. This is important. SEEK MEDICAL CARE IF:  Your dizziness does not go away.  Your dizziness or light-headedness gets worse.  You feel nauseous.  You have reduced hearing.  You have new symptoms.  You are unsteady on your feet or you feel like the room is spinning. SEEK IMMEDIATE MEDICAL CARE IF:  You vomit or have diarrhea and are unable to eat or drink anything.  You have problems talking, walking, swallowing, or using your arms, hands, or legs.  You feel generally weak.  You are not thinking clearly or you have trouble forming sentences. It may take a friend or family member to notice this.  You have chest pain, abdominal pain, shortness of breath, or sweating.  Your vision changes.  You notice any bleeding.  You have a headache.  You have neck pain or a stiff neck.  You have a fever.   This information is not intended to replace advice given to you by your health care provider. Make sure you discuss any questions you have with your health care provider.   Document Released: 04/06/2001 Document Revised: 02/25/2015 Document Reviewed: 10/07/2014 Elsevier Interactive Patient Education 2016 Elsevier Inc.  Nausea and Vomiting Nausea means you feel sick to your stomach. Throwing up (vomiting) is a reflex where  stomach contents come out of your mouth. HOME CARE   Take medicine as told by your doctor.  Do not force yourself to eat. However, you do need to drink fluids.  If you feel like eating, eat a normal diet as told by your doctor.  Eat rice, wheat, potatoes, bread, lean meats, yogurt, fruits, and vegetables.  Avoid high-fat foods.  Drink enough fluids to keep your pee (urine) clear or pale yellow.  Ask your doctor how to replace body fluid losses (rehydrate).  Signs of body fluid loss (dehydration) include:  Feeling very thirsty.  Dry lips and mouth.  Feeling dizzy.  Dark pee.  Peeing less than normal.  Feeling confused.  Fast breathing or heart rate. GET HELP RIGHT AWAY IF:   You have blood in your throw up.  You have black or bloody poop (stool).  You have a bad headache or stiff neck.  You feel confused.  You have bad belly (abdominal) pain.  You have chest pain or trouble breathing.  You do not pee at least once every 8 hours.  You have cold, clammy skin.  You keep throwing up after 24 to 48 hours.  You have a fever. MAKE SURE YOU:   Understand these instructions.  Will watch your condition.  Will get help right away if you are not doing well or get worse.   This information is not intended to replace advice given to you by your health care provider. Make sure you discuss any questions you have with your health care provider.   Document Released: 03/29/2008 Document Revised: 01/03/2012 Document Reviewed: 03/12/2011 Elsevier Interactive Patient Education [employer]

## 2015-08-01 NOTE — ED Notes (Signed)
Patient is resting comfortably. 

## 2015-08-01 NOTE — ED Notes (Signed)
Pt reports emesis for two weeks intermittently. Today had blood in vomit today, dark streaked. Also reports intermittent lightheadedness. States all this began after eating a steak a few weeks ago.

## 2015-08-01 NOTE — ED Provider Notes (Addendum)
[facility] Emergency Department Provider Note     Time seen: ----------------------------------------- 5:34 PM on 08/01/2015 -----------------------------------------    I have reviewed the triage vital signs and the nursing notes.   HISTORY  Chief Complaint Hematemesis    HPI Madison Dixon is a 19 y.o. female who presents to ER with intermittent vomiting for last 2 weeks. Patient states she's also blood in her vomit, it was dark streak. The blood in her vomit was a small amount. She has intermittent lightheadedness, states this began after eating a steak a few weeks ago. She denies any other complaints.   Past Medical History  Diagnosis Date  . Pilonidal cyst   . Asthma     smoke induced  . Depression   . Anxiety     Patient Active Problem List   Diagnosis Date Noted  . Cephalalgia 05/28/2015  . Allergic rhinitis 05/10/2008  . Asthma, exogenous 05/10/2008  . Esophagitis, reflux 05/10/2008    Past Surgical History  Procedure Laterality Date  . Pilonidal cyst excision N/A 04/10/2015    Procedure: CYST EXCISION PILONIDAL EXTENSIVE;  Surgeon: Kieth Brightly, MD;  Location: [facility] ORS;  Service: General;  Laterality: N/A;    Allergies Review of patient's allergies indicates no known allergies.  Social History Social History  Substance Use Topics  . Smoking status: Never Smoker   . Smokeless tobacco: Never Used  . Alcohol Use: No    Review of Systems Constitutional: Negative for fever. Eyes: Negative for visual changes. ENT: Negative for sore throat. Cardiovascular: Negative for chest pain. Respiratory: Negative for shortness of breath. Gastrointestinal: Negative for abdominal pain, positive for vomiting Genitourinary: Negative for dysuria. Musculoskeletal: Negative for back pain. Skin: Negative for rash. Neurological: Negative for headaches, positive for weakness and lightheadedness   10-point ROS otherwise  negative.  ____________________________________________   PHYSICAL EXAM:  VITAL SIGNS: ED Triage Vitals  Enc Vitals Group     BP 08/01/15 1508 122/58 mmHg     Pulse Rate 08/01/15 1508 90     Resp 08/01/15 1508 16     Temp 08/01/15 1508 98.4 F (36.9 C)     Temp Source 08/01/15 1508 Oral     SpO2 08/01/15 1508 98 %     Weight 08/01/15 1508 160 lb (72.576 kg)     Height 08/01/15 1508  (1.575 m)     Head Cir --      Peak Flow --      Pain Score --      Pain Loc --      Pain Edu? --      Excl. in GC? --     Constitutional: Alert and oriented. Well appearing and in no distress. Eyes: Conjunctivae are normal. PERRL. Normal extraocular movements. ENT   Head: Normocephalic and atraumatic.   Nose: No congestion/rhinnorhea.   Mouth/Throat: Mucous membranes are moist.   Neck: No stridor. Cardiovascular: Normal rate, regular rhythm. Normal and symmetric distal pulses are present in all extremities. No murmurs, rubs, or gallops. Respiratory: Normal respiratory effort without tachypnea nor retractions. Breath sounds are clear and equal bilaterally. No wheezes/rales/rhonchi. Gastrointestinal: Soft and nontender. No distention. No abdominal bruits.  Musculoskeletal: Nontender with normal range of motion in all extremities. No joint effusions.  No lower extremity tenderness nor edema. Neurologic:  Normal speech and language. No gross focal neurologic deficits are appreciated. Speech is normal. No gait instability. Skin:  Skin is warm, dry and intact. No rash noted. Psychiatric: Mood and  affect are normal. Speech and behavior are normal. Patient exhibits appropriate insight and judgment. ____________________________________________  ED COURSE:  Pertinent labs & imaging results that were available during my care of the patient were reviewed by me and considered in my medical decision making (see chart for details). She is in no acute distress, will receive IV fluid bolus  as well as IV antiemetics. ____________________________________________    LABS (pertinent positives/negatives)  Labs Reviewed  LIPASE, BLOOD - Abnormal; Notable for the following:    Lipase 17 (*)    All other components within normal limits  URINALYSIS COMPLETEWITH MICROSCOPIC ([facility] ONLY) - Abnormal; Notable for the following:    Color, Urine YELLOW (*)    APPearance HAZY (*)    Hgb urine dipstick 2+ (*)    Leukocytes, UA 2+ (*)    Squamous Epithelial / LPF 6-30 (*)    All other components within normal limits  CBC WITH DIFFERENTIAL/PLATELET  COMPREHENSIVE METABOLIC PANEL  POCT PREGNANCY, URINE    __________________________________________  FINAL ASSESSMENT AND PLAN  Vomiting, dizziness  Plan: Patient with labs and imaging as dictated above. Patient is not pregnant, she has normal labs and urine. She was given a liter fluid bolus as well as IV antiemetics. Symptoms may be related to antidepressant she is taking, also anxiety. She stable for outpatient follow-up with Zantac and Zofran.   Emily Filbert, MD   Emily Filbert, MD 08/01/15 1736  Emily Filbert, MD 08/01/15 [phone]

## 2015-08-01 NOTE — ED Notes (Signed)
Pt states she has never had an issue with acid reflux before, but is willing to try the medication so she can feel better.

## 2015-08-04 ENCOUNTER — Encounter: Payer: Self-pay | Admitting: Family Medicine

## 2015-08-04 ENCOUNTER — Telehealth: Payer: Self-pay | Admitting: Family Medicine

## 2015-08-04 ENCOUNTER — Ambulatory Visit (INDEPENDENT_AMBULATORY_CARE_PROVIDER_SITE_OTHER): Payer: Medicaid Other | Admitting: Family Medicine

## 2015-08-04 VITALS — BP 118/66 | HR 89 | Temp 98.3°F | Resp 16 | Wt 156.8 lb

## 2015-08-04 DIAGNOSIS — F329 Major depressive disorder, single episode, unspecified: Secondary | ICD-10-CM | POA: Diagnosis not present

## 2015-08-04 DIAGNOSIS — R1011 Right upper quadrant pain: Secondary | ICD-10-CM | POA: Diagnosis not present

## 2015-08-04 DIAGNOSIS — R42 Dizziness and giddiness: Secondary | ICD-10-CM

## 2015-08-04 DIAGNOSIS — F32A Depression, unspecified: Secondary | ICD-10-CM | POA: Insufficient documentation

## 2015-08-04 NOTE — Progress Notes (Signed)
Subjective:     Patient ID: Madison Dixon, female   DOB: 04/28/1996, 19 y.o.   MRN: [id number]  HPI  Chief Complaint  Patient presents with  . Abdominal Pain    Patient comes in office today to address RLQ abdominal pain and nausea/vomiting for the past 2 weeks. Patient states that symptoms initial began when she ate out at [facility], patient states that she only has symptoms of nausea and vomiting when she eats.primarily meats and greasy foods.   Also reports intermittent dizziness and loose to formed bowel movements after every meal. She has been on a higher dose of sertraline since 9/6 and states she is emotionally feeling well. 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 Recently seen at the ER for these sx with normal labs. Offered Pepcid, dicyclomine, and Zofran.   Review of Systems  Constitutional: Negative for fever and chills.       Objective:   Physical Exam  Constitutional: She appears well-developed and well-nourished. No distress.  Abdominal: Soft. Bowel sounds are normal. There is tenderness (most tender in RUQ but also Epigastric and midlline lower quadrant ).       Assessment:    1. Right upper quadrant pain - US Abdomen Complete; Future  2. Dizziness:    Plan:   Decrease sertraline to 50 mg.daily. Avoid fatty foods and try low FODMAP food choices (handout provided). Further f/u pending U/S results.

## 2015-08-04 NOTE — Patient Instructions (Signed)
Minimize fatty meals. Try FODMAP dietary choices. We will call you with ultrasound results. Decrease

## 2015-08-04 NOTE — Telephone Encounter (Signed)
I would be happy to see her. I have reviewed the ER records.

## 2015-08-04 NOTE — Telephone Encounter (Signed)
Patient called office today to request appt, she states that she has had abdominal pain and vomiting intermittent for the past 2 weeks. Upon looking in patients chart I saw that she was seen in [facility] ER on 08/01/15 to address this issue. Patient was prescribed Zofran , Bentyl  and Pepcid , when I asked the patient if she has started medication she stated "no". Patient states that physician in ER ignored her problem and she believes pain is due to her gallbladder. Please advise if patient she come to clinic or begin medication as prescribed. KW

## 2015-08-04 NOTE — Telephone Encounter (Signed)
Patient scheduled for office visit at 3:15PM. Madison Dixon

## 2015-08-07 ENCOUNTER — Telehealth: Payer: Self-pay

## 2015-08-07 ENCOUNTER — Ambulatory Visit
Admission: RE | Admit: 2015-08-07 | Discharge: 2015-08-07 | Disposition: A | Discharge status: Home / Self Care | Payer: Medicaid Other | Source: Ambulatory Visit | Attending: Family Medicine | Admitting: Family Medicine

## 2015-08-07 DIAGNOSIS — R1013 Epigastric pain: Secondary | ICD-10-CM | POA: Diagnosis present

## 2015-08-07 DIAGNOSIS — R1011 Right upper quadrant pain: Secondary | ICD-10-CM

## 2015-08-07 NOTE — Telephone Encounter (Signed)
Patient was informed of ultrasound report. KW

## 2015-08-07 NOTE — Telephone Encounter (Signed)
-----   Message from Anola Gurneyobert Chauvin, [place] sent at 08/07/2015  9:49 AM EDT ----- Ultrasound normal: no gallstones. Let's see how you do on the lower dose of sertraline and FODMAP food choices over the next two weeks.

## 2015-09-11 ENCOUNTER — Encounter: Payer: Self-pay | Admitting: *Deleted

## 2015-09-25 ENCOUNTER — Encounter: Payer: Self-pay | Admitting: Family Medicine

## 2015-09-25 ENCOUNTER — Ambulatory Visit (INDEPENDENT_AMBULATORY_CARE_PROVIDER_SITE_OTHER): Payer: Medicaid Other | Admitting: Family Medicine

## 2015-09-25 VITALS — BP 100/58 | HR 112 | Temp 97.9°F | Resp 20 | Ht 62.0 in | Wt 156.8 lb

## 2015-09-25 DIAGNOSIS — F329 Major depressive disorder, single episode, unspecified: Secondary | ICD-10-CM

## 2015-09-25 DIAGNOSIS — F32A Depression, unspecified: Secondary | ICD-10-CM

## 2015-09-25 DIAGNOSIS — F419 Anxiety disorder, unspecified: Secondary | ICD-10-CM | POA: Diagnosis not present

## 2015-09-25 MED ORDER — SERTRALINE HCL 50 MG PO TABS: ORAL_TABLET | ORAL | Status: DC

## 2015-09-25 MED ORDER — CLONAZEPAM 0.5 MG PO TABS: 0.5000 mg | ORAL_TABLET | Freq: Two times a day (BID) | ORAL | Status: DC | PRN

## 2015-09-25 NOTE — Progress Notes (Signed)
Subjective:     Patient ID: Madison Dixon, female   DOB: 1996/05/31, 19 y.o.   MRN: [id number]  HPI  Chief Complaint  Patient presents with  . Depression    last o.v. was on 08/04/15 Sertraline decreased to 50 mg  States he depression is not a well controlled on this dose and wishes to go back to 75 mg.daily. Reports anxiety symptoms manifested by vomiting several x week and occasional right chest discomfort. Wishes referral for counseling as well.   Review of Systems  Constitutional: Negative for fever and chills.       Objective:   Physical Exam  Constitutional: She appears well-developed and well-nourished. No distress.  Psychiatric: She has a normal mood and affect. Her behavior is normal.       Assessment:    1. Depression - sertraline (ZOLOFT) 50 MG tablet; Take one and 1/2 pills daily  Dispense: 45 tablet; Refill: 2 - Ambulatory referral to Psychology  2. Acute anxiety - clonazePAM (KLONOPIN) 0.5 MG tablet; Take 1 tablet (0.5 mg total) by mouth 2 (two) times daily as needed for anxiety.  Dispense: 30 tablet; Refill: 0 - Ambulatory referral to Psychology    Plan:    Will f/u with her in the next 2-3 weeks.

## 2015-09-25 NOTE — Patient Instructions (Signed)
Come back in 2-3 weeks and we will see how you are doing on new medication.

## 2015-09-29 ENCOUNTER — Ambulatory Visit: Payer: Medicaid Other | Admitting: Family Medicine

## 2015-10-03 ENCOUNTER — Ambulatory Visit (INDEPENDENT_AMBULATORY_CARE_PROVIDER_SITE_OTHER): Payer: Medicaid Other | Admitting: Family Medicine

## 2015-10-03 ENCOUNTER — Encounter: Payer: Self-pay | Admitting: Family Medicine

## 2015-10-03 VITALS — BP 116/80 | HR 102 | Temp 97.7°F | Resp 98 | Wt 158.8 lb

## 2015-10-03 DIAGNOSIS — F419 Anxiety disorder, unspecified: Secondary | ICD-10-CM

## 2015-10-03 DIAGNOSIS — R1110 Vomiting, unspecified: Secondary | ICD-10-CM

## 2015-10-03 DIAGNOSIS — F329 Major depressive disorder, single episode, unspecified: Secondary | ICD-10-CM

## 2015-10-03 DIAGNOSIS — R1115 Cyclical vomiting syndrome unrelated to migraine: Secondary | ICD-10-CM

## 2015-10-03 DIAGNOSIS — Z23 Encounter for immunization: Secondary | ICD-10-CM

## 2015-10-03 DIAGNOSIS — F32A Depression, unspecified: Secondary | ICD-10-CM

## 2015-10-03 MED ORDER — CLONAZEPAM 1 MG PO TABS: 1.0000 mg | ORAL_TABLET | Freq: Two times a day (BID) | ORAL | Status: DC | PRN

## 2015-10-03 MED ORDER — SERTRALINE HCL 100 MG PO TABS: 100.0000 mg | ORAL_TABLET | Freq: Every day | ORAL | Status: DC

## 2015-10-03 NOTE — Patient Instructions (Signed)
Follow up with the counselor as scheduled next week. We will call you with the referral to the G.I. Doctor.

## 2015-10-03 NOTE — Progress Notes (Signed)
Subjective:     Patient ID: Madison Dixon, female   DOB: [birth year], 19 y.o.   MRN: [id number]  HPI  Chief Complaint  Patient presents with  . Depression    Patient is present today for follow up for depression last office visit, she has continued with zoloft and would like to speak with a psychologist.   . Panic Attack     Patient is here for follow up from 09/25/15 we stqarted patient on Klonopin 0.5mg , patient states that she is having panic attacks on a daily basis causing her to profusely vomit. Patient statres that she gets shaky and begins to have chest pain and vomits.   Marland Kitchen. GI Problem    Patient reports increased bowel movements she states that everytime she eats she has to immediatley rub to rest room and pass bowel movement, patient states that her stools are loose/soft. She request referral to see a GI docotor.   Accompanied by her mother and baby brother today. States she continues to have daily panic attacks accompanied by G.I. Sx despite current doses of medication. Vomiting has been a long standing problem with recurrent ER visits. She has a left subconjunctival hemorrhage due to vomiting. Also has lost her job as a result. Review of Systems  Psychiatric/Behavioral:       Pending initial therapist appointment on 12/12.       Objective:   Physical Exam  Constitutional: She appears well-developed and well-nourished. No distress.  Abdominal: Soft. There is tenderness (epigastric area with transient guarding).       Assessment:    1. Depression: increase medication - sertraline (ZOLOFT) 100 MG tablet; Take 1 tablet (100 mg total) by mouth daily.  Dispense: 30 tablet; Refill: 3  2. Acute anxiety: increase medication - sertraline (ZOLOFT) 100 MG tablet; Take 1 tablet (100 mg total) by mouth daily.  Dispense: 30 tablet; Refill: 3 - clonazePAM (KLONOPIN) 1 MG tablet; Take 1 tablet (1 mg total) by mouth 2 (two) times daily as needed for anxiety.  Dispense: 60 tablet; Refill:  0  3. Persistent recurrent vomiting: suspect psychoemotional etiology pending G.I. Evaluation. - Ambulatory referral to Gastroenterology  4. Need for influenza vaccination - Flu Vaccine QUAD 36+ mos IM    Plan:    F/u in 4 weeks. In the interim will see a therapist and a G.I. Consultant.

## 2015-10-06 ENCOUNTER — Ambulatory Visit: Payer: Medicaid Other | Admitting: Family Medicine

## 2015-10-08 ENCOUNTER — Other Ambulatory Visit: Payer: Self-pay | Admitting: Student

## 2015-10-08 DIAGNOSIS — R112 Nausea with vomiting, unspecified: Secondary | ICD-10-CM

## 2015-10-08 DIAGNOSIS — R1084 Generalized abdominal pain: Secondary | ICD-10-CM

## 2015-10-13 ENCOUNTER — Ambulatory Visit (INDEPENDENT_AMBULATORY_CARE_PROVIDER_SITE_OTHER): Payer: Medicaid Other | Admitting: General Surgery

## 2015-10-13 ENCOUNTER — Ambulatory Visit: Payer: Medicaid Other | Admitting: Family Medicine

## 2015-10-13 ENCOUNTER — Ambulatory Visit
Admission: RE | Admit: 2015-10-13 | Discharge: 2015-10-13 | Disposition: A | Discharge status: Home / Self Care | Payer: Medicaid Other | Source: Ambulatory Visit | Attending: Student | Admitting: Student

## 2015-10-13 ENCOUNTER — Encounter: Payer: Self-pay | Admitting: General Surgery

## 2015-10-13 VITALS — BP 126/64 | HR 64 | Resp 16 | Ht 62.0 in | Wt 157.0 lb

## 2015-10-13 DIAGNOSIS — L0591 Pilonidal cyst without abscess: Secondary | ICD-10-CM

## 2015-10-13 DIAGNOSIS — R112 Nausea with vomiting, unspecified: Secondary | ICD-10-CM | POA: Diagnosis not present

## 2015-10-13 DIAGNOSIS — L0592 Pilonidal sinus without abscess: Secondary | ICD-10-CM

## 2015-10-13 DIAGNOSIS — R1084 Generalized abdominal pain: Secondary | ICD-10-CM | POA: Insufficient documentation

## 2015-10-13 DIAGNOSIS — K760 Fatty (change of) liver, not elsewhere classified: Secondary | ICD-10-CM | POA: Insufficient documentation

## 2015-10-13 MED ORDER — IOHEXOL 300 MG/ML  SOLN
100.0000 mL | Freq: Once | INTRAMUSCULAR | Status: AC | PRN
  Administered 2015-10-13: 85 mL via INTRAVENOUS

## 2015-10-13 NOTE — Progress Notes (Signed)
Patient ID: [name], female   DOB: [birth year], 19 y.o.   MRN: [id number] Patient here today for pilonidal cyst follow up done 04-10-15. She is doing well, minimal drainage she feels like there is a suture in the site.  I have reviewed the history of present illness with the patient.  No suture present, there is an open area in the middle which is shallow. The area of granulation tissue was cauterized with silver nitrate.  Follow up in two weeks with the nurse. Follow up with Dr. Evette CristalSankar in one month.   This information has been scribed by Milas Kocherebeca Morris, CMA     PCP: Toni ArthursBob Chauvin, PA

## 2015-10-13 NOTE — Patient Instructions (Signed)
Follow up in two weeks with the nurse. Follow up with Dr. Evette CristalSankar in one month. Call with any changes.

## 2015-10-14 ENCOUNTER — Encounter: Payer: Self-pay | Admitting: General Surgery

## 2015-11-03 ENCOUNTER — Ambulatory Visit: Payer: Medicaid Other | Admitting: Family Medicine

## 2015-11-03 ENCOUNTER — Ambulatory Visit: Payer: Medicaid Other

## 2015-11-10 ENCOUNTER — Ambulatory Visit (INDEPENDENT_AMBULATORY_CARE_PROVIDER_SITE_OTHER): Payer: Medicaid Other | Admitting: Family Medicine

## 2015-11-10 ENCOUNTER — Encounter: Payer: Self-pay | Admitting: General Surgery

## 2015-11-10 ENCOUNTER — Ambulatory Visit (INDEPENDENT_AMBULATORY_CARE_PROVIDER_SITE_OTHER): Payer: Medicaid Other | Admitting: General Surgery

## 2015-11-10 ENCOUNTER — Encounter: Payer: Self-pay | Admitting: Family Medicine

## 2015-11-10 VITALS — BP 110/68 | HR 84 | Resp 13 | Ht 62.0 in | Wt 153.0 lb

## 2015-11-10 VITALS — BP 86/48 | HR 62 | Temp 97.6°F | Resp 16 | Wt 154.8 lb

## 2015-11-10 DIAGNOSIS — F4310 Post-traumatic stress disorder, unspecified: Secondary | ICD-10-CM | POA: Diagnosis not present

## 2015-11-10 DIAGNOSIS — L0591 Pilonidal cyst without abscess: Secondary | ICD-10-CM

## 2015-11-10 DIAGNOSIS — F32A Depression, unspecified: Secondary | ICD-10-CM

## 2015-11-10 DIAGNOSIS — F419 Anxiety disorder, unspecified: Secondary | ICD-10-CM

## 2015-11-10 DIAGNOSIS — F329 Major depressive disorder, single episode, unspecified: Secondary | ICD-10-CM | POA: Diagnosis not present

## 2015-11-10 DIAGNOSIS — L0592 Pilonidal sinus without abscess: Secondary | ICD-10-CM

## 2015-11-10 MED ORDER — ALPRAZOLAM 0.5 MG PO TABS: 0.5000 mg | ORAL_TABLET | Freq: Three times a day (TID) | ORAL | Status: DC | PRN

## 2015-11-10 NOTE — Patient Instructions (Signed)
Keep area clean. May use triple ATB ointment daily. Follow up in one month.

## 2015-11-10 NOTE — Progress Notes (Signed)
Subjective:     Patient ID: Madison Dixon, female   DOB: Jan 22, 1996, 20 y.o.   MRN: [id number]  HPI  Chief Complaint  Patient presents with  . Depression    Patient comes in today for follow up for depression, patient medication Sertraline was increased to 100mg , she reports good compliance and tolerance of medication  . Anxiety    Patient returns for follow up from 10/03/2015 her medication Klonopin 1mg  was increased, patient followed up with therapist as advised., She states that she would like to discuss switching off Klonopin she has noticed that when taking it she is easily agitated and angered  Reports she has seen her counselor, Ms. Broadus JohnWarren, on a couple of occasions but has not rescheduled. Also did not establish with psychiatry as recommended per Ms. Broadus JohnWarren. States clonazepam seems to make her more irritable and wishes to try different medication. Reports she has also fleetingly thought about death but no specific suicidal intention or plan.  Review of Systems  Gastrointestinal:       States she was treated for Helicobacter gastritis per G.I. and her recurrent vomiting resolved.       Objective:   Physical Exam  Constitutional: She appears well-developed and well-nourished. No distress.  Psychiatric: She has a normal mood and affect. Her behavior is normal.  smiling on presentation.     Assessment:    1. Depression: continue sertraline pending psychiatry consult.  2. Acute anxiety: d/c clonazepam - ALPRAZolam (XANAX) 0.5 MG tablet; Take 1 tablet (0.5 mg total) by mouth 3 (three) times daily as needed for anxiety.  Dispense: 21 tablet; Refill: 0  3. PTSD (post-traumatic stress disorder): continue counseling    Plan:    Encouraged resumption of counseling and initiate psychiatry contact.

## 2015-11-10 NOTE — Progress Notes (Signed)
Patient ID: Madison Dixon, female   DOB: 1996/09/20, 20 y.o.   MRN: [id number] Patient here today for pilonidal cyst excision follow up done 04-10-15. She is doing well. She reports that the area is much smaller with minimal drainage. She reports the area is still tender.  \I have reviewed the history of present illness with the patient.   The open area is smaller. Minimal granulation tissue noted. Cauterized with Silver Nitrate.  Follow up in one month.    This has been scribed by Sinda Duaryl-Lyn M Kennedy LPN  PCP: Anola Gurneyobert Chauvin

## 2015-11-10 NOTE — Patient Instructions (Signed)
Do schedule f/u appointments with [name] and reschedule with psychiatry.

## 2015-12-05 ENCOUNTER — Ambulatory Visit (INDEPENDENT_AMBULATORY_CARE_PROVIDER_SITE_OTHER): Payer: Medicaid Other | Admitting: Family Medicine

## 2015-12-05 ENCOUNTER — Encounter: Payer: Self-pay | Admitting: Family Medicine

## 2015-12-05 ENCOUNTER — Other Ambulatory Visit: Payer: Self-pay | Admitting: Family Medicine

## 2015-12-05 VITALS — BP 110/74 | HR 76 | Temp 98.4°F | Resp 16 | Wt 153.2 lb

## 2015-12-05 DIAGNOSIS — Z7251 High risk heterosexual behavior: Secondary | ICD-10-CM

## 2015-12-05 DIAGNOSIS — N3090 Cystitis, unspecified without hematuria: Secondary | ICD-10-CM

## 2015-12-05 DIAGNOSIS — F419 Anxiety disorder, unspecified: Secondary | ICD-10-CM | POA: Insufficient documentation

## 2015-12-05 LAB — POCT URINALYSIS DIPSTICK
Bilirubin, UA: NEGATIVE
GLUCOSE UA: NEGATIVE
Ketones, UA: NEGATIVE
NITRITE UA: NEGATIVE
Spec Grav, UA: 1.015
UROBILINOGEN UA: 0.2
pH, UA: 5

## 2015-12-05 LAB — POCT URINE PREGNANCY: PREG TEST UR: NEGATIVE

## 2015-12-05 MED ORDER — NITROFURANTOIN MONOHYD MACRO 100 MG PO CAPS: 100.0000 mg | ORAL_CAPSULE | Freq: Two times a day (BID) | ORAL | Status: DC

## 2015-12-05 NOTE — Patient Instructions (Signed)
We will call you with the urine culture report. 

## 2015-12-05 NOTE — Progress Notes (Signed)
Subjective:     Patient ID: Madison Dixon, female   DOB: August 14, 1996, 20 y.o.   MRN: [id number]  HPI  Chief Complaint  Patient presents with  . Nausea    Patient comes in office today with concerns of possiblity of pregnancy, patient states that she had the Nexplanon inserted and was removed on 11/10/15, patient reports that she had spotting two days ago. Patient reports that she is currently sexually active and LMP was sometime in December, patient states that she has RLQ pain desribed as sharp, and has abdominal firmness. Patient reports home pregnany test was negative but still would like blood work to rule it out.Associated nausea and breast tendernes  States she has continued to be followed by her therapist, Ms. Broadus John, and psychiatrist, Dr. Ladona Ridgel.   Review of Systems     Objective:   Physical Exam  Constitutional: She appears well-developed and well-nourished. No distress.  Abdominal: Soft. There is no tenderness. There is no guarding.  Genitourinary:  No cva tenderness       Assessment:    1. Cystitis - POCT urinalysis dipstick - Urine culture - nitrofurantoin, macrocrystal-monohydrate, (MACROBID) 100 MG capsule; Take 1 capsule (100 mg total) by mouth 2 (two) times daily.  Dispense: 14 capsule; Refill: 0  2. History of unprotected sex - POCT urine pregnancy - hCG, serum, qualitative    Plan:    Further f/u pending urine culture report.

## 2015-12-06 LAB — HCG, SERUM, QUALITATIVE: hCG,Beta Subunit,Qual,Serum: NEGATIVE m[IU]/mL (ref <6)

## 2015-12-08 ENCOUNTER — Encounter: Payer: Self-pay | Admitting: *Deleted

## 2015-12-08 ENCOUNTER — Ambulatory Visit (INDEPENDENT_AMBULATORY_CARE_PROVIDER_SITE_OTHER): Payer: Medicaid Other | Admitting: General Surgery

## 2015-12-08 ENCOUNTER — Encounter: Payer: Self-pay | Admitting: General Surgery

## 2015-12-08 VITALS — BP 130/70 | HR 76 | Resp 12 | Ht 62.0 in | Wt 148.0 lb

## 2015-12-08 DIAGNOSIS — L0592 Pilonidal sinus without abscess: Secondary | ICD-10-CM

## 2015-12-08 DIAGNOSIS — L0591 Pilonidal cyst without abscess: Secondary | ICD-10-CM

## 2015-12-08 LAB — URINE CULTURE

## 2015-12-08 NOTE — Progress Notes (Signed)
This is a 20 year old female here today for her follow up pilonidal cyst. Patient states the area is not draining.  I have reviewed the history of present illness with the patient.  Patient states she has been having right lower quadrant pain for a month now. Pregnancy test was negative. She is going to see her gynecologist today or tomorrow.  The pilonidal  is clean and well healed. The open area has fully healed. No redness or indiuration noted     Patient to return as needed.   PCP:  Chauvin This information has been scribed by Ples Specter CMA.

## 2015-12-08 NOTE — Patient Instructions (Signed)
Patient to return as needed. 

## 2015-12-09 ENCOUNTER — Encounter: Payer: Self-pay | Admitting: General Surgery

## 2015-12-26 ENCOUNTER — Emergency Department: Payer: Medicaid Other

## 2015-12-26 ENCOUNTER — Emergency Department
Admission: EM | Admit: 2015-12-26 | Discharge: 2015-12-26 | Disposition: A | Discharge status: Home / Self Care | Payer: Medicaid Other | Attending: Emergency Medicine | Admitting: Emergency Medicine

## 2015-12-26 ENCOUNTER — Encounter: Payer: Self-pay | Admitting: Emergency Medicine

## 2015-12-26 DIAGNOSIS — R102 Pelvic and perineal pain: Secondary | ICD-10-CM | POA: Diagnosis present

## 2015-12-26 DIAGNOSIS — R1030 Lower abdominal pain, unspecified: Secondary | ICD-10-CM | POA: Diagnosis not present

## 2015-12-26 DIAGNOSIS — R110 Nausea: Secondary | ICD-10-CM | POA: Diagnosis not present

## 2015-12-26 DIAGNOSIS — Z3202 Encounter for pregnancy test, result negative: Secondary | ICD-10-CM | POA: Diagnosis not present

## 2015-12-26 DIAGNOSIS — Z79899 Other long term (current) drug therapy: Secondary | ICD-10-CM | POA: Diagnosis not present

## 2015-12-26 LAB — URINALYSIS COMPLETE WITH MICROSCOPIC (ARMC ONLY)
Bilirubin Urine: NEGATIVE
Glucose, UA: NEGATIVE mg/dL
Hgb urine dipstick: NEGATIVE
KETONES UR: NEGATIVE mg/dL
Leukocytes, UA: NEGATIVE
Nitrite: NEGATIVE
PH: 8 (ref 5.0–8.0)
PROTEIN: NEGATIVE mg/dL
RBC / HPF: NONE SEEN RBC/hpf (ref 0–5)
Specific Gravity, Urine: 1.015 (ref 1.005–1.030)

## 2015-12-26 LAB — CBC WITH DIFFERENTIAL/PLATELET
Basophils Absolute: 0 10*3/uL (ref 0–0.1)
Basophils Relative: 1 %
Eosinophils Absolute: 0.1 10*3/uL (ref 0–0.7)
Eosinophils Relative: 1 %
HEMATOCRIT: 39.7 % (ref 35.0–47.0)
HEMOGLOBIN: 13.2 g/dL (ref 12.0–16.0)
LYMPHS ABS: 1.5 10*3/uL (ref 1.0–3.6)
Lymphocytes Relative: 23 %
MCH: 29.1 pg (ref 26.0–34.0)
MCHC: 33.1 g/dL (ref 32.0–36.0)
MCV: 87.7 fL (ref 80.0–100.0)
MONOS PCT: 6 %
Monocytes Absolute: 0.4 10*3/uL (ref 0.2–0.9)
NEUTROS ABS: 4.6 10*3/uL (ref 1.4–6.5)
NEUTROS PCT: 69 %
Platelets: 261 10*3/uL (ref 150–440)
RBC: 4.53 MIL/uL (ref 3.80–5.20)
RDW: 12.8 % (ref 11.5–14.5)
WBC: 6.5 10*3/uL (ref 3.6–11.0)

## 2015-12-26 LAB — COMPREHENSIVE METABOLIC PANEL
ALBUMIN: 4.3 g/dL (ref 3.5–5.0)
ALK PHOS: 28 U/L — AB (ref 38–126)
ALT: 16 U/L (ref 14–54)
ANION GAP: 6 (ref 5–15)
AST: 17 U/L (ref 15–41)
BUN: 10 mg/dL (ref 6–20)
CO2: 28 mmol/L (ref 22–32)
Calcium: 9.3 mg/dL (ref 8.9–10.3)
Chloride: 105 mmol/L (ref 101–111)
Creatinine, Ser: 0.68 mg/dL (ref 0.44–1.00)
GFR calc Af Amer: >60 mL/min (ref >60)
GFR calc non Af Amer: >60 mL/min (ref >60)
GLUCOSE: 85 mg/dL (ref 65–99)
POTASSIUM: 3.6 mmol/L (ref 3.5–5.1)
SODIUM: 139 mmol/L (ref 135–145)
Total Bilirubin: 1.1 mg/dL (ref 0.3–1.2)
Total Protein: 7 g/dL (ref 6.5–8.1)

## 2015-12-26 LAB — CHLAMYDIA/NGC RT PCR (ARMC ONLY)
Chlamydia Tr: NOT DETECTED
N GONORRHOEAE: NOT DETECTED

## 2015-12-26 LAB — LIPASE, BLOOD: Lipase: 29 U/L (ref 11–51)

## 2015-12-26 LAB — POCT PREGNANCY, URINE: Preg Test, Ur: NEGATIVE

## 2015-12-26 MED ORDER — ONDANSETRON HCL 4 MG/2ML IJ SOLN
4.0000 mg | Freq: Once | INTRAMUSCULAR | Status: AC
  Administered 2015-12-26: 4 mg via INTRAVENOUS
  Filled 2015-12-26: qty 2

## 2015-12-26 MED ORDER — HYDROMORPHONE HCL 1 MG/ML IJ SOLN
1.0000 mg | Freq: Once | INTRAMUSCULAR | Status: AC
  Administered 2015-12-26: 1 mg via INTRAVENOUS

## 2015-12-26 MED ORDER — KETOROLAC TROMETHAMINE 30 MG/ML IJ SOLN
30.0000 mg | Freq: Once | INTRAMUSCULAR | Status: AC
  Administered 2015-12-26: 30 mg via INTRAVENOUS
  Filled 2015-12-26: qty 1

## 2015-12-26 MED ORDER — HYDROMORPHONE HCL 1 MG/ML IJ SOLN
INTRAMUSCULAR | Status: AC
  Administered 2015-12-26: 1 mg via INTRAVENOUS
  Filled 2015-12-26: qty 1

## 2015-12-26 MED ORDER — HYDROMORPHONE HCL 1 MG/ML IJ SOLN
1.0000 mg | Freq: Once | INTRAMUSCULAR | Status: AC
  Administered 2015-12-26: 1 mg via INTRAVENOUS
  Filled 2015-12-26: qty 1

## 2015-12-26 MED ORDER — SODIUM CHLORIDE 0.9 % IV BOLUS (SEPSIS)
1000.0000 mL | Freq: Once | INTRAVENOUS | Status: AC
  Administered 2015-12-26: 1000 mL via INTRAVENOUS

## 2015-12-26 NOTE — ED Notes (Signed)
Pt and significant other verbalized understanding of paper. Denies any needs at this time.  IV removed.

## 2015-12-26 NOTE — ED Provider Notes (Signed)
[facility] Emergency Department Provider Note   ____________________________________________  Time seen: I have reviewed the triage vital signs and the triage nursing note.  HISTORY  Chief Complaint Abdominal Pain   Historian Patient and significant other  HPI Madison Dixon is a 20 y.o. female is here for evaluation of pelvic pain which is been ongoing for about 2 weeks per the patient. The last week or so has been worse. She had been on the Implanon, which was removed due to the pelvic pain last month by her OB/GYN. Since then she had a period last week and since then she's had severe pelvic pain. Reportedly took a round of antibiotics without relief. Patient had an appointment with OB/GYN today was sent in for further evaluation due to extreme pain and tearfulness.  No fever. No out changes. No pelvic discharge. No upper abdominal pain. Occasional nausea due to severe pain.    Past Medical History  Diagnosis Date  . Pilonidal cyst   . Asthma     smoke induced  . Depression   . Anxiety     Patient Active Problem List   Diagnosis Date Noted  . Anxiety disorder 12/05/2015  . PTSD (post-traumatic stress disorder) 11/10/2015  . Depression 08/04/2015  . Allergic rhinitis 05/10/2008  . Asthma, exogenous 05/10/2008    Past Surgical History  Procedure Laterality Date  . Pilonidal cyst excision N/A 04/10/2015    Procedure: CYST EXCISION PILONIDAL EXTENSIVE;  Surgeon: Kieth Brightly, MD;  Location: [facility] ORS;  Service: General;  Laterality: N/A;    Current Outpatient Rx  Name  Route  Sig  Dispense  Refill  . escitalopram (LEXAPRO) 10 MG tablet      Reported on 12/05/2015      0   . nitrofurantoin, macrocrystal-monohydrate, (MACROBID) 100 MG capsule   Oral   Take 1 capsule (100 mg total) by mouth 2 (two) times daily.   14 capsule   0   . ondansetron (ZOFRAN) 4 MG tablet   Oral   Take 1 tablet (4 mg total) by mouth daily as needed for  nausea or vomiting. Patient not taking: Reported on 12/05/2015   20 tablet   1     Allergies Review of patient's allergies indicates no known allergies.  Family History  Problem Relation Age of Onset  . Hyperlipidemia Father   . GER disease Father     Social History Social History  Substance Use Topics  . Smoking status: Never Smoker   . Smokeless tobacco: Never Used  . Alcohol Use: No    Review of Systems  Constitutional: Negative for fever. Eyes: Negative for visual changes. ENT: Negative for sore throat. Cardiovascular: Negative for chest pain. Respiratory: Negative for shortness of breath. Gastrointestinal: Negative for  vomiting and diarrhea. Genitourinary: Negative for dysuria. Musculoskeletal: Negative for back pain. Skin: Negative for rash. Neurological: Negative for headache. 10 point Review of Systems otherwise negative ____________________________________________   PHYSICAL EXAM:  VITAL SIGNS: ED Triage Vitals  Enc Vitals Group     BP 12/26/15 1040 118/53 mmHg     Pulse Rate 12/26/15 1040 65     Resp 12/26/15 1040 18     Temp 12/26/15 1040 98.2 F (36.8 C)     Temp Source 12/26/15 1040 Oral     SpO2 12/26/15 1040 100 %     Weight 12/26/15 1040 145 lb (65.772 kg)     Height 12/26/15 1040  (1.575 m)     Head  Cir --      Peak Flow --      Pain Score 12/26/15 1040 8     Pain Loc --      Pain Edu? --      Excl. in GC? --      Constitutional: Alert and oriented. Crying in pain. HEENT   Head: Normocephalic and atraumatic.      Eyes: Conjunctivae are normal. PERRL. Normal extraocular movements.      Ears:         Nose: No congestion/rhinnorhea.   Mouth/Throat: Mucous membranes are moist.   Neck: No stridor. Cardiovascular/Chest: Normal rate, regular rhythm.  No murmurs, rubs, or gallops. Respiratory: Normal respiratory effort without tachypnea nor retractions. Breath sounds are clear and equal bilaterally. No  wheezes/rales/rhonchi. Gastrointestinal: Soft. No distention, no rebound. Severe tenderness suprapubic and pelvic area with voluntary guarding. Genitourinary/rectal: No discharge. Suprapubic tenderness without cervicitis Musculoskeletal: Nontender with normal range of motion in all extremities. No joint effusions.  No lower extremity tenderness.  No edema. Neurologic:  Normal speech and language. No gross or focal neurologic deficits are appreciated. Skin:  Skin is warm, dry and intact. No rash noted. Psychiatric: Mood and affect are normal. Speech and behavior are normal. Patient exhibits appropriate insight and judgment.  ____________________________________________   EKG I, Governor Rooks, MD, the attending physician have personally viewed and interpreted all ECGs.  None ____________________________________________  LABS (pertinent positives/negatives)  Pregnancy test negative Compressive metabolic panel without significant abnormalities Lipase 29 CBC within normal limits Urinalysis rare bacteria otherwise negative Gonorrhea and Chlamydia negative ____________________________________________  RADIOLOGY All Xrays were viewed by me. Imaging interpreted by Radiologist.  Ultrasound trans vaginal and pelvic: Study within normal limits. __________________________________________  PROCEDURES  Procedure(s) performed: None  Critical Care performed: None  ____________________________________________   ED COURSE / ASSESSMENT AND PLAN  Pertinent labs & imaging results that were available during my care of the patient were reviewed by me and considered in my medical decision making (see chart for details).   Patient is here with essentially chronic pelvic pain at this point in time, over 2 months. Sounds like possibly gynecologic in etiology, possibly Demetrio cyst, but she has not been diagnosed with this.  No focal right lower quadrant tenderness, and given that her pain is  improved, and no white blood cell count, and no fever, and pain over 2 months, I have extremely low suspicion that this is a presentation of an intra-abdominal infection, or surgical emergency such as appendicitis.  I reviewed patient's CT from December, patient is unsure whether or not that was related to this same episode of pain. I discussed with her that she is young and having repeat CT scans may raise her chance of cancer the future. My impression is low risk for her presentation today being related to [place] intra-abdominal or surgical emergency and I do not recommend CT scan. She agrees with this at this point in time.  We discussed return precautions with respect to internal emergency's.  At this point I do think she is having a significant emotional component to her chronic pelvic pain. In addition to following with open GYN for possible endometriosis, I am recommending some alternative therapies including anti-inflammatory diet, as well as meditation and cognitive behavioral therapy for chronic pain relief.    CONSULTATIONS:   None   Patient / Family / Caregiver informed of clinical course, medical decision-making process, and agree with plan.   I discussed return precautions, follow-up instructions, and discharged  instructions with patient and/or family.   ___________________________________________   FINAL CLINICAL IMPRESSION(S) / ED DIAGNOSES   Final diagnoses:  Pelvic pain in female              Note: This dictation was prepared with Dragon dictation. Any transcriptional errors that result from this process are unintentional   Governor Rooksebecca Omnia Dollinger, MD 12/26/15 [phone]

## 2015-12-26 NOTE — ED Notes (Signed)
Pt [facility], has been having constant lower abd pain for over a week now, had the implant removed from her arm last month, had a period a week ago, and states the pain since her period has been awful. Was put on a round of antibiotics with no relief. Is unable to describe the pain she is feeling, states it "radiates everywhere." Tearful in triage.

## 2015-12-26 NOTE — Discharge Instructions (Signed)
You were evaluated for ongoing pelvic pain, and although no certain cause was found, your exam and evaluation are reassuring in the emergency department.  Return to the emergency room for any worsening pain, different type of pain, fever, or any other symptoms concerning to you.  We discussed, for chronic pain I suggest google search --  1)Antiinflammatory Diet -- try "Whole 30" for 30 days  2)Cognitive Behavioral therapy -- may look up "Dynamic Neural Retraining System" by Blanchie DessertAnnie Hopper  3)Meditation -- log on to [email or web address] today, sign up for Current free 7 days of learning Tapping meditation    Abdominal Pain, Adult Many things can cause belly (abdominal) pain. Most times, the belly pain is not dangerous. Many cases of belly pain can be watched and treated at home. HOME CARE   Do not take medicines that help you go poop (laxatives) unless told to by your doctor.  Only take medicine as told by your doctor.  Eat or drink as told by your doctor. Your doctor will tell you if you should be on a special diet. GET HELP IF:  You do not know what is causing your belly pain.  You have belly pain while you are sick to your stomach (nauseous) or have runny poop (diarrhea).  You have pain while you pee or poop.  Your belly pain wakes you up at night.  You have belly pain that gets worse or better when you eat.  You have belly pain that gets worse when you eat fatty foods.  You have a fever. GET HELP RIGHT AWAY IF:   The pain does not go away within 2 hours.  You keep throwing up (vomiting).  The pain changes and is only in the right or left part of the belly.  You have bloody or tarry looking poop. MAKE SURE YOU:   Understand these instructions.  Will watch your condition.  Will get help right away if you are not doing well or get worse.   This information is not intended to replace advice given to you by your health care provider. Make sure you discuss any  questions you have with your health care provider.   Document Released: 03/29/2008 Document Revised: 11/01/2014 Document Reviewed: 06/20/2013 Elsevier Interactive Patient Education 2016 Elsevier Inc.   Chronic Pain Chronic pain can be defined as pain that is off and on and lasts for 3-6 months or longer. Many things cause chronic pain, which can make it difficult to make a diagnosis. There are many treatment options available for chronic pain. However, finding a treatment that works well for you may require trying various approaches until the right one is found. Many people benefit from a combination of two or more types of treatment to control their pain. SYMPTOMS  Chronic pain can occur anywhere in the body and can range from mild to very severe. Some types of chronic pain include:  Headache.  Low back pain.  Cancer pain.  Arthritis pain.  Neurogenic pain. This is pain resulting from damage to nerves. People with chronic pain may also have other symptoms such as:  Depression.  Anger.  Insomnia.  Anxiety. DIAGNOSIS  Your health care provider will help diagnose your condition over time. In many cases, the initial focus will be on excluding possible conditions that could be causing the pain. Depending on your symptoms, your health care provider may order tests to diagnose your condition. Some of these tests may include:   Blood tests.   CT scan.  MRI.   X-rays.   Ultrasounds.   Nerve conduction studies.  You may need to see a specialist.  TREATMENT  Finding treatment that works well may take time. You may be referred to a pain specialist. He or she may prescribe medicine or therapies, such as:   Mindful meditation or yoga.  Shots (injections) of numbing or pain-relieving medicines into the spine or area of pain.  Local electrical stimulation.  Acupuncture.   Massage therapy.   Aroma, color, light, or sound therapy.   Biofeedback.   Working with  a physical therapist to keep from getting stiff.   Regular, gentle exercise.   Cognitive or behavioral therapy.   Group support.  Sometimes, surgery may be recommended.  HOME CARE INSTRUCTIONS   Take all medicines as directed by your health care provider.   Lessen stress in your life by relaxing and doing things such as listening to calming music.   Exercise or be active as directed by your health care provider.   Eat a healthy diet and include things such as vegetables, fruits, fish, and lean meats in your diet.   Keep all follow-up appointments with your health care provider.   Attend a support group with others suffering from chronic pain. SEEK MEDICAL CARE IF:   Your pain gets worse.   You develop a new pain that was not there before.   You cannot tolerate medicines given to you by your health care provider.   You have new symptoms since your last visit with your health care provider.  SEEK IMMEDIATE MEDICAL CARE IF:   You feel weak.   You have decreased sensation or numbness.   You lose control of bowel or bladder function.   Your pain suddenly gets much worse.   You develop shaking.  You develop chills.  You develop confusion.  You develop chest pain.  You develop shortness of breath.  MAKE SURE YOU:  Understand these instructions.  Will watch your condition.  Will get help right away if you are not doing well or get worse.   This information is not intended to replace advice given to you by your health care provider. Make sure you discuss any questions you have with your health care provider.   Document Released: 07/03/2002 Document Revised: 06/13/2013 Document Reviewed: 04/06/2013 Elsevier Interactive Patient Education [employer]

## 2015-12-26 NOTE — ED Notes (Signed)
Patient returned from US.

## 2015-12-26 NOTE — ED Notes (Signed)
Patient transported to Ultrasound 

## 2016-01-05 ENCOUNTER — Ambulatory Visit: Payer: Medicaid Other | Admitting: Family Medicine

## 2016-02-23 ENCOUNTER — Encounter: Payer: Self-pay | Admitting: Family Medicine

## 2016-03-09 ENCOUNTER — Encounter: Payer: Self-pay | Admitting: Family Medicine

## 2016-03-26 ENCOUNTER — Ambulatory Visit (INDEPENDENT_AMBULATORY_CARE_PROVIDER_SITE_OTHER): Payer: Medicaid Other | Admitting: Family Medicine

## 2016-03-26 ENCOUNTER — Encounter: Payer: Self-pay | Admitting: Family Medicine

## 2016-03-26 VITALS — BP 110/66 | HR 60 | Temp 97.9°F | Resp 16 | Wt 139.0 lb

## 2016-03-26 DIAGNOSIS — J029 Acute pharyngitis, unspecified: Secondary | ICD-10-CM | POA: Diagnosis not present

## 2016-03-26 DIAGNOSIS — B349 Viral infection, unspecified: Secondary | ICD-10-CM

## 2016-03-26 LAB — POCT RAPID STREP A (OFFICE): Rapid Strep A Screen: NEGATIVE

## 2016-03-26 MED ORDER — DOXYCYCLINE HYCLATE 100 MG PO TABS: 100.0000 mg | ORAL_TABLET | Freq: Two times a day (BID) | ORAL | Status: DC

## 2016-03-26 NOTE — Progress Notes (Signed)
Subjective:     Patient ID: Madison Dixon, female   DOB: May 25, 1996, 20 y.o.   MRN: [id number]  HPI  Chief Complaint  Patient presents with  . Generalized Body Aches    Patient comes in office today with concerns of body ache and vomiting this morning. Patient states this morning she wouke up with abdnominal pain and as the day went on she complains of feeling weak, vomiting, congestion and sore throat.    Reports she does spend time at the park.Currently working as a Production assistant, radioserver wishes work excuse. States she no longer feels depressed and is not seeing Ms. Broadus JohnWarren or Dr. Ladona Ridgelaylor any longer. Continues to live with her boyfriend and his family but is reestablishing contact with her father and mother. She is still pending f/u with [facility]arding her workup there for recurrent vomiting and diarrhea.   Review of Systems     Objective:   Physical Exam  Constitutional: She appears well-developed and well-nourished. No distress.  Ears: T.M's intact without inflammat Throat: mild tonsillar enlargement/erythema Neck: no cervical adenopathy Lungs: clear     Assessment:    1. Sore throat - POCT rapid strep A  2. Viral syndrome - doxycycline (VIBRA-TABS) 100 MG tablet; Take 1 tablet (100 mg total) by mouth 2 (two) times daily.  Dispense: 14 tablet; Refill: 1    Plan:    Work excuse for 6/2-03/28/16. Discussed most likely she will develop further viral symptoms. If body aches, fever or chills persist to start doxycycline. Encouraged to try again to schedule f/u with [facility]

## 2016-03-26 NOTE — Patient Instructions (Signed)
Keep up with fluids and eat as tolerated using Zofran. Tylenol for aches and pains. If fever, headache, body aches are not improving over the next day or two start doxycycline antibiotic. Do [facility]they have been trying to contact you.

## 2016-04-13 ENCOUNTER — Ambulatory Visit (INDEPENDENT_AMBULATORY_CARE_PROVIDER_SITE_OTHER): Payer: Medicaid Other | Admitting: Family Medicine

## 2016-04-13 ENCOUNTER — Encounter: Payer: Self-pay | Admitting: Family Medicine

## 2016-04-13 VITALS — BP 110/60 | HR 62 | Temp 98.3°F | Resp 16 | Wt 136.6 lb

## 2016-04-13 DIAGNOSIS — R112 Nausea with vomiting, unspecified: Secondary | ICD-10-CM

## 2016-04-13 DIAGNOSIS — J301 Allergic rhinitis due to pollen: Secondary | ICD-10-CM | POA: Diagnosis not present

## 2016-04-13 MED ORDER — FLUTICASONE PROPIONATE 50 MCG/ACT NA SUSP: 2.0000 | Freq: Every day | NASAL | Status: DC

## 2016-04-13 NOTE — Patient Instructions (Signed)
Let me know if sinuses are not improving or drainage is becoming thicker and colored.

## 2016-04-13 NOTE — Progress Notes (Signed)
Subjective:     Patient ID: Madison Dixon, female   DOB: [birth year], [age] y.o.   MRN: [id number]  HPI  Chief Complaint  Patient presents with  . Sinusitis    Patient is here today with c/o of waking up vomiting only phlegm(yelowish) and having nasal congestion,cough,sinus pressure,headache,postnasal drip with sorethroat only in the mornings and SOB. No Fever, chest pain, palpitation,wheezing, or chest tightness. No Treatments.  Reports frontal sinus pressure with primarily watery clear drainage with yellow tint. Earlier this month completed a course of doxycycline to cover a possible tick fever presentation. States she recovered from that.   Review of Systems  Gastrointestinal:       Missed her [facility] F/u regarding recurrent nausea and vomiting after upper and lower endoscopies in May. Had one colon polyp and ? micro colitis.  Psychiatric/Behavioral:       Reports she is re-engaging with her dad from whom she had been estranged due to his mental illness and drug use. Reports he is staying on medication and doing better.       Objective:   Physical Exam  Constitutional: She appears well-developed and well-nourished. No distress.  Ears: T.M's intact without inflammation Sinuses: mild frontal sinus tenderness Throat: no tonsillar enlargement or exudate Neck: no cervical adenopathy Lungs: clear     Assessment:    1. Allergic rhinitis due to pollen - fluticasone (FLONASE) 50 MCG/ACT nasal spray; Place 2 sprays into both nostrils daily.  Dispense: 16 g; Refill: 6  2. Non-intractable vomiting with nausea, vomiting of unspecified type - Ambulatory referral to Gastroenterology    Plan:    Call if not improving.

## 2016-05-19 ENCOUNTER — Encounter: Payer: Self-pay | Admitting: Family Medicine

## 2016-05-19 ENCOUNTER — Ambulatory Visit (INDEPENDENT_AMBULATORY_CARE_PROVIDER_SITE_OTHER): Payer: Medicaid Other | Admitting: Family Medicine

## 2016-05-19 VITALS — BP 108/60 | HR 64 | Temp 98.4°F | Resp 16 | Wt 139.4 lb

## 2016-05-19 DIAGNOSIS — R21 Rash and other nonspecific skin eruption: Secondary | ICD-10-CM | POA: Diagnosis not present

## 2016-05-19 DIAGNOSIS — N898 Other specified noninflammatory disorders of vagina: Secondary | ICD-10-CM | POA: Diagnosis not present

## 2016-05-19 LAB — POCT URINE PREGNANCY: PREG TEST UR: NEGATIVE

## 2016-05-19 MED ORDER — METRONIDAZOLE 500 MG PO TABS: 500.0000 mg | ORAL_TABLET | Freq: Two times a day (BID) | ORAL | 0 refills | Status: DC

## 2016-05-19 MED ORDER — TRIAMCINOLONE ACETONIDE 0.1 % EX CREA: 1.0000 "application " | TOPICAL_CREAM | Freq: Two times a day (BID) | CUTANEOUS | 1 refills | Status: DC

## 2016-05-19 NOTE — Patient Instructions (Signed)
We will call you with the lab results. 

## 2016-05-19 NOTE — Progress Notes (Signed)
Subjective:     Patient ID: Madison Dixon, female   DOB: [birth year], 20 y.o.   MRN: [id number]  HPI  Chief Complaint  Patient presents with  . Rash    Patient presents in office today with conerns of a possible rash on her right breast that has been present for over two months. Patient describes skin as red and itchy at times, she denies taking any otc medication for itching. Patient denies any new detergent, lotion, or soap products  . Vaginal Discharge    Patient has concefns of vaginal odor and discharge for the past 3-4 week.s Patient states that ordor has a metallic smell and she has noticed milky white discharge.   . Late Period    Patient request urine pregnancy test, she states that she is over dye for her menstrual cycle and has began to experience pain and soreness in her nipples.   Reports she continues to be sexually active with the same partner with consistent condom use-"I check them after sex". LMP 6/10. No hx of eczema or insect bites.   Review of Systems     Objective:   Physical Exam  Constitutional: She appears well-developed and well-nourished. No distress.  Skin:  Right breast with several 1-2 mm.punctate erosions. No pustules or vesicles.       Assessment:    1. Rash and nonspecific skin eruption: ? Eczema ? neuroexcoriation - triamcinolone cream (KENALOG) 0.1 %; Apply 1 application topically 2 (two) times daily. To breast rash  Dispense: 30 g; Refill: 1  2. Vaginal discharge: Most likely B.V. - Chlamydia/Gonococcus/Trichomonas, NAA - POCT urine pregnancy - metroNIDAZOLE (FLAGYL) 500 MG tablet; Take 1 tablet (500 mg total) by mouth 2 (two) times daily.  Dispense: 14 tablet; Refill: 0    Plan:    Further f/u pending lab work.

## 2016-05-21 ENCOUNTER — Telehealth: Payer: Self-pay

## 2016-05-21 LAB — CHLAMYDIA/GONOCOCCUS/TRICHOMONAS, NAA
Chlamydia by NAA: NEGATIVE
Gonococcus by NAA: NEGATIVE
Trich vag by NAA: NEGATIVE

## 2016-05-21 NOTE — Telephone Encounter (Signed)
-----   Message from Anola Gurney, [place] sent at 05/21/2016  7:38 AM EDT ----- Urine test negative for STD

## 2016-05-21 NOTE — Telephone Encounter (Signed)
Patient has been advised of lab results. KW 

## 2016-06-29 ENCOUNTER — Encounter: Payer: Self-pay | Admitting: Family Medicine

## 2016-06-29 ENCOUNTER — Ambulatory Visit (INDEPENDENT_AMBULATORY_CARE_PROVIDER_SITE_OTHER): Payer: Medicaid Other | Admitting: Family Medicine

## 2016-06-29 VITALS — BP 100/60 | HR 68 | Temp 98.1°F | Resp 16 | Wt 137.4 lb

## 2016-06-29 DIAGNOSIS — R140 Abdominal distension (gaseous): Secondary | ICD-10-CM | POA: Diagnosis not present

## 2016-06-29 DIAGNOSIS — R110 Nausea: Secondary | ICD-10-CM | POA: Diagnosis not present

## 2016-06-29 DIAGNOSIS — R21 Rash and other nonspecific skin eruption: Secondary | ICD-10-CM

## 2016-06-29 DIAGNOSIS — N926 Irregular menstruation, unspecified: Secondary | ICD-10-CM

## 2016-06-29 LAB — POCT URINE PREGNANCY: Preg Test, Ur: NEGATIVE

## 2016-06-29 MED ORDER — ONDANSETRON HCL 4 MG PO TABS: 4.0000 mg | ORAL_TABLET | Freq: Three times a day (TID) | ORAL | 1 refills | Status: DC | PRN

## 2016-06-29 NOTE — Progress Notes (Signed)
Subjective:     Patient ID: Madison Dixon, female   DOB: 1996/03/11, 20 y.o.   MRN: [id number]  HPI  Chief Complaint  Patient presents with  . Emesis    Patient comes in office today with concerns of vomiting after eating mals. Patient reports that she has been dealing with nausea for the past 3 months, associated patient complains of heartburn and bloating.   . Menstrual Problem    Patient would like to address irregular menstrual cycle for the past several months.   . Skin Problem    Patient would like to address moles on skin that have changed in size and color since tanning. Patient would also like to address rash that is still present on right breast, patient was last seen for rash on 05/19/16  States she gets a feeling of fullness after just a few bites then gets nauseous and bloats. She sometimes will vomit to relieve her symptoms. States she has both periods of diarrhea and constipation. Denies significant stress in her life. 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 States she is no longer estranged from her Dad but her mother goes "in and out" of her life. Lives with her boyfriend and family. Had normal EGD in 01/2016; colonoscopy at that time with one polyp. Reports treatment for Helicobacter in the past. Reports she missed her period in July, menses a few days late this month.   Review of Systems     Objective:   Physical Exam  Constitutional: She appears well-developed and well-nourished. No distress.  Cardiovascular: Normal rate and regular rhythm.   Abdominal: Soft. There is tenderness (mild epigastric). There is no guarding.  Skin:  Three 0.5 cm dark brown nevi are noted on her lower abdomen.       Assessment:    1. Irregular periods/menstrual cycles: discussed observation for now; does not wish to get on BCPs due to weight gain with Depo and Nexplanon. - POCT urine pregnancy  2. Nausea - H. pylori breath test - ondansetron (ZOFRAN) 4 MG tablet; Take 1 tablet  (4 mg total) by mouth every 8 (eight) hours as needed for nausea or vomiting.  Dispense: 21 tablet; Refill: 1  3. Postprandial bloating - H. pylori breath test  4. Rash and nonspecific skin eruption - Ambulatory referral to Dermatology    Plan:    Provided with the FODMAP diet. Will see how she is doing in 2 weeks.

## 2016-06-29 NOTE — Patient Instructions (Signed)
Try the FODMAP diet. May use Zofran for nausea

## 2016-07-01 ENCOUNTER — Telehealth: Payer: Self-pay | Admitting: Family Medicine

## 2016-07-01 NOTE — Telephone Encounter (Signed)
Pt would like a call back for lab results. Please advise. Thanks TNP

## 2016-07-02 ENCOUNTER — Other Ambulatory Visit: Payer: Self-pay | Admitting: Family Medicine

## 2016-07-02 DIAGNOSIS — R140 Abdominal distension (gaseous): Secondary | ICD-10-CM

## 2016-07-02 LAB — H.PYLORI BREATH TEST (REFLEX): H. PYLORI BREATH TEST: NEGATIVE

## 2016-07-02 LAB — H. PYLORI BREATH TEST

## 2016-07-02 NOTE — Telephone Encounter (Signed)
Spoke with patient on the phone she states that it has been hard for her to follow FODMAP because she has a busy schedule with work so often she eats take out. Patient reports fullness, nausea and bloating. Please advise. KW

## 2016-07-02 NOTE — Telephone Encounter (Signed)
[id number] 

## 2016-07-02 NOTE — Telephone Encounter (Signed)
Pt stated she was returning Kat's call. I advised below and she stated that she would like to go back to GI. Thanks TNP

## 2016-07-02 NOTE — Telephone Encounter (Signed)
-----   Message from Anola Gurneyobert Chauvin, [place] sent at 07/02/2016  7:59 AM EDT ----- Breath  Test ok. Is she trying low FODMAP food suggestions? Is her fullness, nausea and bloating improving?

## 2016-07-02 NOTE — Telephone Encounter (Signed)
Please review-aa 

## 2016-07-02 NOTE — Telephone Encounter (Signed)
I would be happy to send her back to [facility] If she wishes.

## 2016-07-02 NOTE — Telephone Encounter (Signed)
Referral in progress. 

## 2016-07-09 ENCOUNTER — Encounter: Payer: Self-pay | Admitting: Family Medicine

## 2016-07-09 ENCOUNTER — Ambulatory Visit (INDEPENDENT_AMBULATORY_CARE_PROVIDER_SITE_OTHER): Payer: Medicaid Other | Admitting: Family Medicine

## 2016-07-09 VITALS — BP 112/58 | HR 66 | Temp 98.2°F | Resp 16 | Wt 134.8 lb

## 2016-07-09 DIAGNOSIS — N3090 Cystitis, unspecified without hematuria: Secondary | ICD-10-CM

## 2016-07-09 DIAGNOSIS — R631 Polydipsia: Secondary | ICD-10-CM

## 2016-07-09 DIAGNOSIS — H538 Other visual disturbances: Secondary | ICD-10-CM

## 2016-07-09 LAB — POCT GLYCOSYLATED HEMOGLOBIN (HGB A1C)

## 2016-07-09 LAB — POCT URINALYSIS DIPSTICK
Bilirubin, UA: NEGATIVE
Blood, UA: NEGATIVE
Glucose, UA: NEGATIVE
Ketones, UA: NEGATIVE
NITRITE UA: NEGATIVE
PH UA: 8
PROTEIN UA: NEGATIVE
Spec Grav, UA: 1.01
UROBILINOGEN UA: 1

## 2016-07-09 MED ORDER — NITROFURANTOIN MONOHYD MACRO 100 MG PO CAPS: 100.0000 mg | ORAL_CAPSULE | Freq: Two times a day (BID) | ORAL | 0 refills | Status: DC

## 2016-07-09 NOTE — Patient Instructions (Signed)
We will call with the urine culture results. 

## 2016-07-09 NOTE — Progress Notes (Signed)
Subjective:     Patient ID: Madison Dixon, female   DOB: 14-Mar-1996, 20 y.o.   MRN: [id number]  HPI  Chief Complaint  Patient presents with  . Urinary Frequency    Patient comes in office today with concerns of urinary frequency and urgency at night.   . Blurred Vision    Patient would like to address symptoms of blurred vision and increased thrist.   States blurred vision is intermittent. States LMP was 9/7.Currently using condoms. Denies depression or increased anxiety. "I love my job." Does not wish to resume sertraline. Has G.I. Appointment pending for evaluation of bloating and ? IBS.   Review of Systems     Objective:   Physical Exam  Constitutional: She appears well-developed and well-nourished.  HENT:   Appears  Mildly proptotic  Eyes: EOM are normal. Pupils are equal, round, and reactive to light.  Genitourinary:  Genitourinary Comments: No CVA tenderness       Assessment:    1. Cystitis - POCT urinalysis dipstick - Urine culture  2. Increased thirst - POCT glycosylated hemoglobin (Hb A1C)  3. Blurry vision, bilateral - POCT glycosylated hemoglobin (Hb A1C) - Ambulatory referral to Ophthalmology    Plan:    If thirst not improving with current rx consider renal profile, TSH.

## 2016-07-11 LAB — URINE CULTURE

## 2016-07-12 ENCOUNTER — Telehealth: Payer: Self-pay

## 2016-07-12 ENCOUNTER — Other Ambulatory Visit: Payer: Self-pay | Admitting: Family Medicine

## 2016-07-12 DIAGNOSIS — R631 Polydipsia: Secondary | ICD-10-CM

## 2016-07-12 NOTE — Telephone Encounter (Signed)
-----   Message from Anola Gurneyobert Chauvin, [place] sent at 07/12/2016  7:36 AM EDT ----- No specific organism detected. If urinary symptoms have improved continue antibiotic. Still having increased thirst?

## 2016-07-12 NOTE — Telephone Encounter (Signed)
Pt advised. States she has not filled prescription. Is still c/o increased thirst. Please advise Allene DillonEmily Drozdowski, CMA

## 2016-07-12 NOTE — Telephone Encounter (Signed)
Please have her pick up lab slip I have place up front. Would like to check her kidney and thyroid labs.

## 2016-07-12 NOTE — Telephone Encounter (Signed)
Patient was advised. KW 

## 2016-07-13 ENCOUNTER — Other Ambulatory Visit: Payer: Self-pay | Admitting: Family Medicine

## 2016-07-13 ENCOUNTER — Emergency Department (HOSPITAL_COMMUNITY)
Admission: EM | Admit: 2016-07-13 | Discharge: 2016-07-14 | Disposition: A | Discharge status: Home / Self Care | Payer: Medicaid Other | Attending: Emergency Medicine | Admitting: Emergency Medicine

## 2016-07-13 ENCOUNTER — Encounter (HOSPITAL_COMMUNITY): Payer: Self-pay | Admitting: *Deleted

## 2016-07-13 DIAGNOSIS — J45909 Unspecified asthma, uncomplicated: Secondary | ICD-10-CM | POA: Diagnosis not present

## 2016-07-13 DIAGNOSIS — R1030 Lower abdominal pain, unspecified: Secondary | ICD-10-CM | POA: Diagnosis not present

## 2016-07-13 DIAGNOSIS — R109 Unspecified abdominal pain: Secondary | ICD-10-CM

## 2016-07-13 DIAGNOSIS — R102 Pelvic and perineal pain: Secondary | ICD-10-CM

## 2016-07-13 HISTORY — DX: Unspecified ovarian cyst, unspecified side: N83.209

## 2016-07-13 LAB — URINALYSIS, ROUTINE W REFLEX MICROSCOPIC
Glucose, UA: NEGATIVE mg/dL
Hgb urine dipstick: NEGATIVE
KETONES UR: 15 mg/dL — AB
NITRITE: NEGATIVE
PH: 6 (ref 5.0–8.0)
PROTEIN: NEGATIVE mg/dL
Specific Gravity, Urine: 1.027 (ref 1.005–1.030)

## 2016-07-13 LAB — CBC
HEMATOCRIT: 39.7 % (ref 36.0–46.0)
Hemoglobin: 13 g/dL (ref 12.0–15.0)
MCH: 29.7 pg (ref 26.0–34.0)
MCHC: 32.7 g/dL (ref 30.0–36.0)
MCV: 90.8 fL (ref 78.0–100.0)
PLATELETS: 332 10*3/uL (ref 150–400)
RBC: 4.37 MIL/uL (ref 3.87–5.11)
RDW: 12.3 % (ref 11.5–15.5)
WBC: 8 10*3/uL (ref 4.0–10.5)

## 2016-07-13 LAB — COMPREHENSIVE METABOLIC PANEL
ALT: 14 U/L (ref 14–54)
AST: 18 U/L (ref 15–41)
Albumin: 4.7 g/dL (ref 3.5–5.0)
Alkaline Phosphatase: 30 U/L — ABNORMAL LOW (ref 38–126)
Anion gap: 8 (ref 5–15)
BILIRUBIN TOTAL: 0.7 mg/dL (ref 0.3–1.2)
BUN: 9 mg/dL (ref 6–20)
CO2: 27 mmol/L (ref 22–32)
Calcium: 9.7 mg/dL (ref 8.9–10.3)
Chloride: 104 mmol/L (ref 101–111)
Creatinine, Ser: 0.65 mg/dL (ref 0.44–1.00)
Glucose, Bld: 76 mg/dL (ref 65–99)
POTASSIUM: 3.6 mmol/L (ref 3.5–5.1)
Sodium: 139 mmol/L (ref 135–145)
TOTAL PROTEIN: 7.5 g/dL (ref 6.5–8.1)

## 2016-07-13 LAB — URINE MICROSCOPIC-ADD ON

## 2016-07-13 LAB — I-STAT BETA HCG BLOOD, ED (MC, WL, AP ONLY): I-stat hCG, quantitative: <5 m[IU]/mL (ref <5)

## 2016-07-13 LAB — LIPASE, BLOOD: Lipase: 20 U/L (ref 11–51)

## 2016-07-13 NOTE — ED Provider Notes (Signed)
[facility]-EMERGENCY DEPT Provider Note   CSN: [id number] Arrival date & time: 07/13/16  1607   By signing my name below, I, Avnee Patel, attest that this documentation has been prepared under the direction and in the presence of Geoffery Lyons, MD;Hashim Eichhorst*  Electronically Signed: Clovis Pu, ED Scribe. 07/13/16. 12:04 AM.   History   Chief Complaint Chief Complaint  Patient presents with  . Abdominal Pain  . Emesis    The history is provided by the patient. No language interpreter was used.  Abdominal Pain   This is a recurrent problem. The current episode started more than 1 week ago. The problem has not changed since onset.The quality of the pain is sharp, dull and aching. The pain is moderate. Associated symptoms include vomiting. Pertinent negatives include fever.  Emesis   Associated symptoms include abdominal pain. Pertinent negatives include no fever.   HPI Comments:  Madison Dixon is a 20 y.o. female who presents to the Emergency Department complaining of intermittent, moderate lower abdominal pain onset 1-2 weeks. Pt notes the pain can either be "dull and achy" and sometimes "sharp". Pt has had associated emesis. Pt notes the mild discomfort to the touch. She notes she visited her PCP and they will be running tests. She denies any fevers, hematemesis, or any vaginal discharge. Pt's LMP was 07/01/16. Pt is sexually active. No alleviating factors noted.   Past Medical History:  Diagnosis Date  . Anxiety   . Asthma    smoke induced  . Depression   . Ovarian cyst   . Pilonidal cyst     Patient Active Problem List   Diagnosis Date Noted  . Anxiety disorder 12/05/2015  . PTSD (post-traumatic stress disorder) 11/10/2015  . Allergic rhinitis 05/10/2008  . Asthma, exogenous 05/10/2008    Past Surgical History:  Procedure Laterality Date  . PILONIDAL CYST EXCISION N/A 04/10/2015   Procedure: CYST EXCISION PILONIDAL EXTENSIVE;  Surgeon: Kieth Brightly, MD;  Location:  [facility] ORS;  Service: General;  Laterality: N/A;    OB History    Gravida Para Term Preterm AB Living   0 0 0 0 0 0   SAB TAB Ectopic Multiple Live Births   0 0 0 0        Obstetric Comments   1st Menstrual Cycle:  11        Home Medications    Prior to Admission medications   Medication Sig Start Date End Date Taking? Authorizing Provider  fluticasone (FLONASE) 50 MCG/ACT nasal spray Place 2 sprays into both nostrils daily. Patient not taking: Reported on 06/29/2016 04/13/16   Anola Gurney, PA  nitrofurantoin, macrocrystal-monohydrate, (MACROBID) 100 MG capsule Take 1 capsule (100 mg total) by mouth 2 (two) times daily. 07/09/16   Anola Gurney, PA  ondansetron (ZOFRAN) 4 MG tablet Take 1 tablet (4 mg total) by mouth every 8 (eight) hours as needed for nausea or vomiting. 06/29/16   Anola Gurney, PA    Family History Family History  Problem Relation Age of Onset  . Hyperlipidemia Father   . GER disease Father     Social History Social History  Substance Use Topics  . Smoking status: Never Smoker  . Smokeless tobacco: Never Used  . Alcohol use No     Allergies   Review of patient's allergies indicates no known allergies.   Review of Systems Review of Systems  Constitutional: Negative for fever.  Gastrointestinal: Positive for abdominal pain and vomiting.  All other systems reviewed and are  negative.    Physical Exam Updated Vital Signs BP 117/59   Pulse 65   Temp 98.2 F (36.8 C)   Resp 18   LMP 07/01/2016   SpO2 99%   Physical Exam  Constitutional: She is oriented to person, place, and time. She appears well-developed and well-nourished. No distress.  HENT:  Head: Normocephalic and atraumatic.  Right Ear: Hearing normal.  Left Ear: Hearing normal.  Nose: Nose normal.  Mouth/Throat: Oropharynx is clear and moist and mucous membranes are normal.  Eyes: Conjunctivae and EOM are normal. Pupils are equal, round, and reactive to light.  Neck: Normal  range of motion. Neck supple.  Cardiovascular: Regular rhythm, S1 normal and S2 normal.  Exam reveals no gallop and no friction rub.   No murmur heard. Pulmonary/Chest: Effort normal and breath sounds normal. No respiratory distress. She exhibits no tenderness.  Abdominal: Soft. Normal appearance and bowel sounds are normal. There is no hepatosplenomegaly. There is tenderness. There is no rebound, no guarding, no tenderness at McBurney's point and negative Murphy's sign. No hernia.  TTP across lower abdomen    Musculoskeletal: Normal range of motion.  Neurological: She is alert and oriented to person, place, and time. She has normal strength. No cranial nerve deficit or sensory deficit. Coordination normal. GCS eye subscore is 4. GCS verbal subscore is 5. GCS motor subscore is 6.  Skin: Skin is warm, dry and intact. No rash noted. No cyanosis.  Psychiatric: She has a normal mood and affect. Her speech is normal and behavior is normal. Thought content normal.  Nursing note and vitals reviewed.    ED Treatments / Results  DIAGNOSTIC STUDIES:  Oxygen Saturation is 97% on RA, normal by my interpretation.    COORDINATION OF CARE:  12:00 AM Discussed treatment plan with pt at bedside and pt agreed to plan.  Labs (all labs ordered are listed, but only abnormal results are displayed) Labs Reviewed  COMPREHENSIVE METABOLIC PANEL - Abnormal; Notable for the following:       Result Value   Alkaline Phosphatase 30 (*)    All other components within normal limits  URINALYSIS, ROUTINE W REFLEX MICROSCOPIC ([facility]) - Abnormal; Notable for the following:    Color, Urine AMBER (*)    APPearance CLOUDY (*)    Bilirubin Urine SMALL (*)    Ketones, ur 15 (*)    Leukocytes, UA SMALL (*)    All other components within normal limits  URINE MICROSCOPIC-ADD ON - Abnormal; Notable for the following:    Squamous Epithelial / LPF 6-30 (*)    Bacteria, UA MANY (*)    All other components within  normal limits  LIPASE, BLOOD  CBC  I-STAT BETA HCG BLOOD, ED ([facility], [facility], [facility] ONLY)    EKG  EKG Interpretation None       Radiology No results found.  Procedures Procedures (including critical care time)  Medications Ordered in ED Medications - No data to display   Initial Impression / Assessment and Plan / ED Course  I have reviewed the triage vital signs and the nursing notes.  Pertinent labs & imaging results that were available during my care of the patient were reviewed by me and considered in my medical decision making (see chart for details).  Clinical Course    Patient presents with complaints of bilateral lower abdominal pain. Her laboratory studies, urinalysis, and pregnancy test are all negative. She reports a history of an ovarian cyst and an ultrasound was  performed. This revealed no evidence for cyst. She now appears much more comfortable and I believe is appropriate for discharge. She has no complaints of vaginal discharge or bleeding and reports no new sexual partners. The patient has declined a pelvic exam. She will return as needed if her symptoms worsen.  Final Clinical Impressions(s) / ED Diagnoses   Final diagnoses:  None    New Prescriptions New Prescriptions   No medications on file  I personally performed the services described in this documentation, which was scribed in my presence. The recorded information has been reviewed and is accurate.        Geoffery Lyons, MD 07/14/16 [phone]

## 2016-07-13 NOTE — ED Triage Notes (Signed)
PT is here with right lower quadrant pain for a while and now with sharp pain.  Pt reports vomiting.  LMP 07/01/16.  Pt has history of ovarian cysts

## 2016-07-14 ENCOUNTER — Emergency Department (HOSPITAL_COMMUNITY): Payer: Medicaid Other

## 2016-07-14 ENCOUNTER — Telehealth: Payer: Self-pay

## 2016-07-14 LAB — T4, FREE: Free T4: 1.48 ng/dL (ref 0.93–1.60)

## 2016-07-14 LAB — TSH: TSH: 2.78 u[IU]/mL (ref 0.450–4.500)

## 2016-07-14 LAB — RENAL FUNCTION PANEL
ALBUMIN: 4.8 g/dL (ref 3.5–5.5)
BUN/Creatinine Ratio: 15 (ref 9–23)
BUN: 10 mg/dL (ref 6–20)
CALCIUM: 9.8 mg/dL (ref 8.7–10.2)
CO2: 26 mmol/L (ref 18–29)
CREATININE: 0.68 mg/dL (ref 0.57–1.00)
Chloride: 99 mmol/L (ref 96–106)
GFR calc Af Amer: 147 mL/min/{1.73_m2} (ref >59)
GFR, EST NON AFRICAN AMERICAN: 127 mL/min/{1.73_m2} (ref >59)
Glucose: 93 mg/dL (ref 65–99)
PHOSPHORUS: 3.4 mg/dL (ref 2.5–5.3)
Potassium: 4.1 mmol/L (ref 3.5–5.2)
Sodium: 140 mmol/L (ref 134–144)

## 2016-07-14 MED ORDER — SODIUM CHLORIDE 0.9 % IV BOLUS (SEPSIS): 30.0000 mL/kg | Freq: Once | INTRAVENOUS | Status: DC

## 2016-07-14 NOTE — Discharge Instructions (Signed)
Ibuprofen 600 mg every six hours as needed for pain.  Return to the emergency department if you develop worsening pain, high fevers, bloody stools, or other new and concerning symptoms.

## 2016-07-14 NOTE — ED Notes (Signed)
Patient transported to Ultrasound 

## 2016-07-14 NOTE — Telephone Encounter (Signed)
[id number] 

## 2016-07-14 NOTE — Telephone Encounter (Signed)
-----   Message from Anola Gurneyobert Chauvin, [place] sent at 07/14/2016  7:38 AM EDT ----- Labs ok. Let's what the the eye and stomach doctors have to say.

## 2016-07-14 NOTE — Telephone Encounter (Signed)
Advised pt of lab results. Pt verbally acknowledges understanding. Azekiel Cremer Drozdowski, CMA   

## 2016-07-14 NOTE — ED Notes (Signed)
Pt left without receiving discharge paperwork/instructions

## 2016-07-14 NOTE — ED Notes (Signed)
Attempted IV x2 without success  

## 2016-07-15 ENCOUNTER — Ambulatory Visit: Payer: Self-pay | Admitting: Family Medicine

## 2016-08-06 ENCOUNTER — Ambulatory Visit
Admission: RE | Admit: 2016-08-06 | Discharge: 2016-08-06 | Disposition: A | Discharge status: Home / Self Care | Payer: Medicaid Other | Source: Ambulatory Visit | Attending: Physician Assistant | Admitting: Physician Assistant

## 2016-08-06 ENCOUNTER — Ambulatory Visit (INDEPENDENT_AMBULATORY_CARE_PROVIDER_SITE_OTHER): Payer: Medicaid Other | Admitting: Physician Assistant

## 2016-08-06 ENCOUNTER — Encounter: Payer: Self-pay | Admitting: Physician Assistant

## 2016-08-06 VITALS — BP 102/60 | HR 64 | Temp 98.0°F | Resp 16 | Wt 135.0 lb

## 2016-08-06 DIAGNOSIS — M546 Pain in thoracic spine: Secondary | ICD-10-CM

## 2016-08-06 DIAGNOSIS — R937 Abnormal findings on diagnostic imaging of other parts of musculoskeletal system: Secondary | ICD-10-CM | POA: Insufficient documentation

## 2016-08-06 DIAGNOSIS — G44209 Tension-type headache, unspecified, not intractable: Secondary | ICD-10-CM | POA: Diagnosis not present

## 2016-08-06 DIAGNOSIS — G2581 Restless legs syndrome: Secondary | ICD-10-CM

## 2016-08-06 DIAGNOSIS — R0789 Other chest pain: Secondary | ICD-10-CM | POA: Diagnosis not present

## 2016-08-06 MED ORDER — IBUPROFEN 600 MG PO TABS: ORAL_TABLET | ORAL | 0 refills | Status: DC

## 2016-08-06 MED ORDER — PRAMIPEXOLE DIHYDROCHLORIDE 0.125 MG PO TABS: 0.1250 mg | ORAL_TABLET | Freq: Every day | ORAL | 0 refills | Status: DC

## 2016-08-06 MED ORDER — IBUPROFEN 600 MG PO TABS: 600.0000 mg | ORAL_TABLET | Freq: Three times a day (TID) | ORAL | 0 refills | Status: DC | PRN

## 2016-08-06 NOTE — Progress Notes (Signed)
Patient: Madison Dixon Female    DOB: Jul 14, 1996   20 y.o.   MRN: [id number] Visit Date: 08/06/2016  Today's Provider: Trinna Post, PA-C   Chief Complaint  Patient presents with  . Back Pain    Started about two weeks ago//was doing heavy lifting at work around the same time.  Marland Kitchen Headache    Started around the same as her back pain.   Subjective:    Back Pain  This is a new problem. The current episode started 1 to 4 weeks ago. The problem occurs daily (Pt reports the pain comes and goes; but hurts everyday.). The problem has been gradually worsening since onset. The pain is present in the thoracic spine. The quality of the pain is described as aching and burning. The pain does not radiate. The pain is at a severity of 5/10 (8/10 pain at its worse.). The symptoms are aggravated by standing and sitting. Associated symptoms include headaches, numbness and tingling. Pertinent negatives include no abdominal pain, bladder incontinence, bowel incontinence, chest pain, fever or leg pain. She has tried NSAIDs for the symptoms. The treatment provided no relief.  Headache   The current episode started 1 to 4 weeks ago. The problem occurs daily. The problem has been unchanged. The pain is located in the bilateral region. Radiates to: Radiates to her forehead sometimes. The pain quality is not similar to prior headaches. Quality: "pressure feeling" The pain is at a severity of 4/10 (Has been at high as 7/10). Associated symptoms include back pain, nausea, numbness, rhinorrhea, sinus pressure, a sore throat, tingling and tinnitus (Occasionally). Pertinent negatives include no abdominal pain, coughing, dizziness, ear pain, fever, neck pain, scalp tenderness, seizures or vomiting. She has tried NSAIDs and acetaminophen for the symptoms. The treatment provided mild (Acetaminophen help better then Ibuprofen.) relief.   Patient also reports right sided shoulder pain ongoing for several months.  Cannot identify any inciting factor. Nothing makes it better or worse. Reports some chest tightness and SOB. Pain 5/10.  No Known Allergies   Current Outpatient Prescriptions:  .  fluticasone (FLONASE) 50 MCG/ACT nasal spray, Place 2 sprays into both nostrils daily., Disp: 16 g, Rfl: 6 .  ibuprofen (ADVIL,MOTRIN) 200 MG tablet, Take 400 mg by mouth every 6 (six) hours as needed for moderate pain., Disp: , Rfl:   Review of Systems  Constitutional: Positive for appetite change (Pt reports "She gets full very fast". ) and fatigue (Pt reports being "tired all the time." ). Negative for activity change, chills, diaphoresis, fever and unexpected weight change.  HENT: Positive for congestion, rhinorrhea, sinus pressure, sneezing, sore throat and tinnitus (Occasionally). Negative for ear discharge, ear pain, nosebleeds, postnasal drip and trouble swallowing.   Eyes: Negative.   Respiratory: Positive for chest tightness and shortness of breath. Negative for apnea, cough, choking, wheezing and stridor.   Cardiovascular: Negative for chest pain, palpitations and leg swelling.  Gastrointestinal: Positive for nausea. Negative for abdominal distention, abdominal pain, anal bleeding, blood in stool, bowel incontinence, constipation, diarrhea, rectal pain and vomiting.  Genitourinary: Negative for bladder incontinence.  Musculoskeletal: Positive for arthralgias (Right shoulder pain.Marland KitchenMarland KitchenPt says this has been an ongoing issue.  ), back pain, myalgias and neck stiffness (Pt says the back of her neck is stiff.). Negative for gait problem, joint swelling and neck pain.  Allergic/Immunologic: Positive for environmental allergies.  Neurological: Positive for tingling, numbness and headaches. Negative for dizziness, tremors, seizures, syncope and light-headedness.  Social History  Substance Use Topics  . Smoking status: Never Smoker  . Smokeless tobacco: Never Used  . Alcohol use No   Objective:   BP 102/60 (BP  Location: Left Arm, Patient Position: Sitting, Cuff Size: Normal)   Pulse 64   Temp 98 F (36.7 C) (Oral)   Resp 16   Wt 135 lb (61.2 kg)   LMP 07/01/2016   BMI 24.69 kg/m   Physical Exam  Constitutional: She is oriented to person, place, and time. She appears well-developed and well-nourished. No distress.  HENT:  Head: Normocephalic and atraumatic.  Right Ear: External ear normal.  Left Ear: External ear normal.  Nose: Nose normal.  Mouth/Throat: Oropharynx is clear and moist. No oropharyngeal exudate.  Eyes: Conjunctivae and EOM are normal. Pupils are equal, round, and reactive to light. Right eye exhibits no discharge. Left eye exhibits no discharge.  Neck: Normal range of motion.  Cardiovascular: Normal rate, regular rhythm and normal heart sounds.   Pulmonary/Chest: Effort normal and breath sounds normal.  Abdominal: Soft. Bowel sounds are normal.  Musculoskeletal: Normal range of motion. She exhibits tenderness. She exhibits no edema or deformity.       Cervical back: She exhibits no tenderness, no bony tenderness, no swelling, no edema, no pain and no spasm.       Thoracic back: She exhibits pain. She exhibits no bony tenderness, no edema and no deformity.       Lumbar back: She exhibits no tenderness, no swelling, no edema, no deformity, no pain and no spasm.       Arms: Lymphadenopathy:    She has no cervical adenopathy.  Neurological: She is alert and oriented to person, place, and time. She has normal reflexes. She displays normal reflexes. No cranial nerve deficit or sensory deficit. She exhibits normal muscle tone. Coordination and gait normal.  Reflex Scores:      Tricep reflexes are 2+ on the right side and 2+ on the left side.      Bicep reflexes are 2+ on the right side and 2+ on the left side.      Brachioradialis reflexes are 2+ on the right side and 2+ on the left side.      Patellar reflexes are 2+ on the right side and 2+ on the left side.      Achilles  reflexes are 2+ on the right side and 2+ on the left side. Strength normal in limbs except for some decreased strength appreciated in patient's right bicep. Slight weakness on exam, with patient saying "i'm really weak in that arm." Patient observed using both arms normally in exam rooms, unfolding EKG leads.   Skin: Skin is warm and dry. No rash noted. She is not diaphoretic. No erythema. No pallor.  Psychiatric: She has a normal mood and affect. Her speech is normal and behavior is normal. Thought content normal.  Vitals reviewed.       Assessment & Plan:      Problem List Items Addressed This Visit    None    Visit Diagnoses    Chest tightness    -  Primary   Relevant Orders   EKG 12-Lead (Completed)   Restless leg syndrome       Acute bilateral thoracic back pain       Relevant Medications   ibuprofen (ADVIL,MOTRIN) 600 MG tablet   Other Relevant Orders   DG Thoracic Spine W/Swimmers (Completed)   Tension headache  Relevant Medications   ibuprofen (ADVIL,MOTRIN) 600 MG tablet     Patient is 20 y/o presenting with back pain and headaches. Difficult to determine etiology of symptoms. They do not match dermatomal or neurological distributions that I am aware of. Patient reports right arm weakness, with potential right bicep weakness on exam, but has no pain or symptoms in cervical plexus area. Right bicep is not in distribution correlated with thoracic pain. Patient reports bilateral leg numbness, which does not align with pain caused by issues in thoracic distribution. Sensation normal on exam, with patient reporting perceived differences in sensation. EKG in office today was normal. Will treat back pain  above with prescription strength ibuprofen around the clock for two weeks.  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 I will order the recommended follow up imaging. This may be the source of her shoulder pain.  I have independently viewed these images myself.  Patient presents with tension headaches. She may use over the counter analgesics for these.  Patient presents with numbness and urge to move legs at night. Patient counseled on restless leg syndrome and advised to exercise, eliminate caffiene, and practice stress reduction techniques.  I have spent 25 min with this pt, >50% was spent on education and coordination of care.  Return in about 2 weeks (around 08/20/2016) for Mariel Sleet follow up back pain.  The entirety of the information documented in the History of Present Illness, Review of Systems and Physical Exam were personally obtained by me. Portions of this information were initially documented by Ashley Royalty, CMA and reviewed by me for thoroughness and accuracy.   Patient Instructions  Back Injury Prevention Back injuries can be very painful. They can also be difficult to heal. After having one back injury, you are more likely to injure your back again. It is important to learn how to avoid injuring or re-injuring your back. The following tips can help you to prevent a back injury. WHAT SHOULD I KNOW ABOUT PHYSICAL FITNESS?  Exercise for 30 minutes per day on most days of the week or as directed by your health care provider. Make sure to:  Do aerobic exercises, such as walking, jogging, biking, or swimming.  Do exercises that increase balance and strength, such as tai chi and yoga. These can decrease your risk of falling and injuring your back.  Do stretching exercises to help with flexibility.  Try to develop strong abdominal muscles. Your abdominal muscles provide a lot of the support that is needed by your back.  Maintain a healthy weight. This helps to decrease your risk of a back injury. WHAT SHOULD I KNOW ABOUT MY DIET?  Talk with your health care provider about your overall diet. Take supplements and vitamins only as directed by your health care provider.  Talk with your  health care provider about how much calcium and vitamin D you need each day. These nutrients help to prevent weakening of the bones (osteoporosis). Osteoporosis can cause broken (fractured) bones, which lead to back pain.  Include good sources of calcium in your diet, such as dairy products, green leafy vegetables, and products that have had calcium added to them (fortified).  Include good sources of vitamin D in your diet, such as milk and foods that are fortified with vitamin D. WHAT SHOULD I KNOW ABOUT MY POSTURE?  Sit up straight and stand up straight. Avoid leaning forward when you sit or hunching over when  you stand.  Choose chairs that have good low-back (lumbar) support.  If you work at a desk, sit close to it so you do not need to lean over. Keep your chin tucked in. Keep your neck drawn back, and keep your elbows bent at a right angle. Your arms should look like the letter "L."  Sit high and close to the steering wheel when you drive. Add a lumbar support to your car seat, if needed.  Avoid sitting or standing in one position for very long. Take breaks to get up, stretch, and walk around at least one time every hour. Take breaks every hour if you are driving for long periods of time.  Sleep on your side with your knees slightly bent, or sleep on your back with a pillow under your knees. Do not lie on the front of your body to sleep. WHAT SHOULD I KNOW ABOUT LIFTING, TWISTING, AND REACHING? Lifting and Heavy Lifting  Avoid heavy lifting, especially repetitive heavy lifting. If you must do heavy lifting:  Stretch before lifting.  Work slowly.  Rest between lifts.  Use a tool such as a cart or a dolly to move objects if one is available.  Make several small trips instead of carrying one heavy load.  Ask for help when you need it, especially when moving big objects.  Follow these steps when lifting:  Stand with your feet shoulder-width apart.  Get as close to the object  as you can. Do not try to pick up a heavy object that is far from your body.  Use handles or lifting straps if they are available.  Bend at your knees. Squat down, but keep your heels off the floor.  Keep your shoulders pulled back, your chin tucked in, and your back straight.  Lift the object slowly while you tighten the muscles in your legs, abdomen, and buttocks. Keep the object as close to the center of your body as possible.  Follow these steps when putting down a heavy load:  Stand with your feet shoulder-width apart.  Lower the object slowly while you tighten the muscles in your legs, abdomen, and buttocks. Keep the object as close to the center of your body as possible.  Keep your shoulders pulled back, your chin tucked in, and your back straight.  Bend at your knees. Squat down, but keep your heels off the floor.  Use handles or lifting straps if they are available. Twisting and Reaching  Avoid lifting heavy objects above your waist.  Do not twist at your waist while you are lifting or carrying a load. If you need to turn, move your feet.  Do not bend over without bending at your knees.  Avoid reaching over your head, across a table, or for an object on a high surface. WHAT ARE SOME OTHER TIPS?  Avoid wet floors and icy ground. Keep sidewalks clear of ice to prevent falls.  Do not sleep on a mattress that is too soft or too hard.  Keep items that are used frequently within easy reach.  Put heavier objects on shelves at waist level, and put lighter objects on lower or higher shelves.  Find ways to decrease your stress, such as exercise, massage, or relaxation techniques. Stress can build up in your muscles. Tense muscles are more vulnerable to injury.  Talk with your health care provider if you feel anxious or depressed. These conditions can make back pain worse.  Wear flat heel shoes with cushioned soles.  Avoid sudden movements.  Use both shoulder straps when  carrying a backpack.  Do not use any tobacco products, including cigarettes, chewing tobacco, or electronic cigarettes. If you need help quitting, ask your health care provider.   This information is not intended to replace advice given to you by your health care provider. Make sure you discuss any questions you have with your health care provider.   Document Released: 11/18/2004 Document Revised: 02/25/2015 Document Reviewed: 10/15/2014 Elsevier Interactive Patient Education 2016 [place]

## 2016-08-06 NOTE — Patient Instructions (Signed)
Back Injury Prevention Back injuries can be very painful. They can also be difficult to heal. After having one back injury, you are more likely to injure your back again. It is important to learn how to avoid injuring or re-injuring your back. The following tips can help you to prevent a back injury. WHAT SHOULD I KNOW ABOUT PHYSICAL FITNESS?  Exercise for 30 minutes per day on most days of the week or as directed by your health care provider. Make sure to:  Do aerobic exercises, such as walking, jogging, biking, or swimming.  Do exercises that increase balance and strength, such as tai chi and yoga. These can decrease your risk of falling and injuring your back.  Do stretching exercises to help with flexibility.  Try to develop strong abdominal muscles. Your abdominal muscles provide a lot of the support that is needed by your back.  Maintain a healthy weight. This helps to decrease your risk of a back injury. WHAT SHOULD I KNOW ABOUT MY DIET?  Talk with your health care provider about your overall diet. Take supplements and vitamins only as directed by your health care provider.  Talk with your health care provider about how much calcium and vitamin D you need each day. These nutrients help to prevent weakening of the bones (osteoporosis). Osteoporosis can cause broken (fractured) bones, which lead to back pain.  Include good sources of calcium in your diet, such as dairy products, green leafy vegetables, and products that have had calcium added to them (fortified).  Include good sources of vitamin D in your diet, such as milk and foods that are fortified with vitamin D. WHAT SHOULD I KNOW ABOUT MY POSTURE?  Sit up straight and stand up straight. Avoid leaning forward when you sit or hunching over when you stand.  Choose chairs that have good low-back (lumbar) support.  If you work at a desk, sit close to it so you do not need to lean over. Keep your chin tucked in. Keep your neck  drawn back, and keep your elbows bent at a right angle. Your arms should look like the letter "L."  Sit high and close to the steering wheel when you drive. Add a lumbar support to your car seat, if needed.  Avoid sitting or standing in one position for very long. Take breaks to get up, stretch, and walk around at least one time every hour. Take breaks every hour if you are driving for long periods of time.  Sleep on your side with your knees slightly bent, or sleep on your back with a pillow under your knees. Do not lie on the front of your body to sleep. WHAT SHOULD I KNOW ABOUT LIFTING, TWISTING, AND REACHING? Lifting and Heavy Lifting  Avoid heavy lifting, especially repetitive heavy lifting. If you must do heavy lifting:  Stretch before lifting.  Work slowly.  Rest between lifts.  Use a tool such as a cart or a dolly to move objects if one is available.  Make several small trips instead of carrying one heavy load.  Ask for help when you need it, especially when moving big objects.  Follow these steps when lifting:  Stand with your feet shoulder-width apart.  Get as close to the object as you can. Do not try to pick up a heavy object that is far from your body.  Use handles or lifting straps if they are available.  Bend at your knees. Squat down, but keep your heels off the floor.    Keep your shoulders pulled back, your chin tucked in, and your back straight.  Lift the object slowly while you tighten the muscles in your legs, abdomen, and buttocks. Keep the object as close to the center of your body as possible.  Follow these steps when putting down a heavy load:  Stand with your feet shoulder-width apart.  Lower the object slowly while you tighten the muscles in your legs, abdomen, and buttocks. Keep the object as close to the center of your body as possible.  Keep your shoulders pulled back, your chin tucked in, and your back straight.  Bend at your knees. Squat  down, but keep your heels off the floor.  Use handles or lifting straps if they are available. Twisting and Reaching  Avoid lifting heavy objects above your waist.  Do not twist at your waist while you are lifting or carrying a load. If you need to turn, move your feet.  Do not bend over without bending at your knees.  Avoid reaching over your head, across a table, or for an object on a high surface. WHAT ARE SOME OTHER TIPS?  Avoid wet floors and icy ground. Keep sidewalks clear of ice to prevent falls.  Do not sleep on a mattress that is too soft or too hard.  Keep items that are used frequently within easy reach.  Put heavier objects on shelves at waist level, and put lighter objects on lower or higher shelves.  Find ways to decrease your stress, such as exercise, massage, or relaxation techniques. Stress can build up in your muscles. Tense muscles are more vulnerable to injury.  Talk with your health care provider if you feel anxious or depressed. These conditions can make back pain worse.  Wear flat heel shoes with cushioned soles.  Avoid sudden movements.  Use both shoulder straps when carrying a backpack.  Do not use any tobacco products, including cigarettes, chewing tobacco, or electronic cigarettes. If you need help quitting, ask your health care provider.   This information is not intended to replace advice given to you by your health care provider. Make sure you discuss any questions you have with your health care provider.   Document Released: 11/18/2004 Document Revised: 02/25/2015 Document Reviewed: 10/15/2014 Elsevier Interactive Patient Education 2016 [employer]  

## 2016-08-09 ENCOUNTER — Ambulatory Visit
Admission: RE | Admit: 2016-08-09 | Discharge: 2016-08-09 | Disposition: A | Discharge status: Home / Self Care | Payer: Medicaid Other | Source: Ambulatory Visit | Attending: Physician Assistant | Admitting: Physician Assistant

## 2016-08-09 ENCOUNTER — Ambulatory Visit (INDEPENDENT_AMBULATORY_CARE_PROVIDER_SITE_OTHER): Payer: Medicaid Other | Admitting: Physician Assistant

## 2016-08-09 ENCOUNTER — Telehealth: Payer: Self-pay

## 2016-08-09 ENCOUNTER — Encounter: Payer: Self-pay | Admitting: Physician Assistant

## 2016-08-09 ENCOUNTER — Telehealth: Payer: Self-pay | Admitting: Physician Assistant

## 2016-08-09 VITALS — BP 110/74 | HR 72 | Temp 98.6°F | Resp 16 | Wt 134.0 lb

## 2016-08-09 DIAGNOSIS — M546 Pain in thoracic spine: Secondary | ICD-10-CM | POA: Insufficient documentation

## 2016-08-09 DIAGNOSIS — F419 Anxiety disorder, unspecified: Secondary | ICD-10-CM

## 2016-08-09 DIAGNOSIS — R200 Anesthesia of skin: Secondary | ICD-10-CM

## 2016-08-09 LAB — POCT GLYCOSYLATED HEMOGLOBIN (HGB A1C)
Est. average glucose Bld gHb Est-mCnc: 100
Hemoglobin A1C: 5.1

## 2016-08-09 MED ORDER — HYDROXYZINE PAMOATE 100 MG PO CAPS
100.0000 mg | ORAL_CAPSULE | Freq: Three times a day (TID) | ORAL | 0 refills | Status: DC | PRN
Start: 2016-08-09 — End: 2016-09-09

## 2016-08-09 MED ORDER — KETOROLAC TROMETHAMINE 60 MG/2ML IM SOLN
60.0000 mg | Freq: Once | INTRAMUSCULAR | Status: AC
  Administered 2016-08-09: 60 mg via INTRAMUSCULAR

## 2016-08-09 NOTE — Telephone Encounter (Signed)
Patient called back office to address x-ray report from 08/06/16 patient states that she has concerns that she might have a possible tumor or abnormal growth growing on her back and wants to discuss if it needs to be removed. Patient states she does not know cause of back pain and would like for nurse or PA to give her a call back ASAP to clarify. KW

## 2016-08-09 NOTE — Patient Instructions (Signed)

## 2016-08-09 NOTE — Telephone Encounter (Signed)
Pt would like a call back from Adriana's nurse to discuss what the plan for her back is moving forward. Please advise. Thanks TNP

## 2016-08-09 NOTE — Telephone Encounter (Signed)
LMTCB 08/09/2016  Thanks,   -Jaydrien Wassenaar  

## 2016-08-09 NOTE — Telephone Encounter (Signed)
Spoke with pt about her X-ray and answered all her questions.  She will wait on the results of the chest xray.  Thanks,   -Vernona RiegerLaura

## 2016-08-09 NOTE — Telephone Encounter (Signed)
Pt advised.   Thanks,   -Laura  

## 2016-08-09 NOTE — Telephone Encounter (Signed)
-----   Message from April Paul HalfM [place], [place] sent at 08/09/2016  4:09 PM EDT -----   ----- Message ----- From: Trey SailorsAdriana M Pollak, PA-C Sent: 08/09/2016   2:38 PM To: [facility]  Density seen last radiograph was not redemonstrated today. CXR shows no abnormal. Spoke with Dr. Chilton SiGreen, the reading radiologist, who did not recommend any further follow up for this. Dr. Chilton SiGreen reports that these findings should be asymptomatic. Please advise patient that no further follow up for this is recommended. Thank you.

## 2016-08-09 NOTE — Progress Notes (Signed)
Patient: Madison Dixon Female    DOB: 03-Feb-1996   20 y.o.   MRN: [id number] Visit Date: 08/09/2016  Today's Provider: Trey Sailors, PA-C   Chief Complaint  Patient presents with  . Back Pain   Subjective:    HPI Patient comes in today for evaluation of back pain. She reports that she was seen in the office on Friday (3 days) ago, and was prescribed Ibuprofen which she has been taking every 6 hours. Patient reports that she rates pain on the pain scale 1-10 a 10 (at its worst). Patient reports that she is now starting to have a burning sensation along with numbness in her lower extremities.     Seen in clinic on Friday c/o of the same. Reporting now her back pain has actually been going on for four weeks, rather than two. Numbness in bilateral hands in glove pattern that is transient. Reports she cannot move her arms past her back. Says her legs do not want to move as much at night "with the medication I prescribed her." Vomited once. Able to keep food and water down. Denies bowel or bladder incontinence. Felt feverish but no measured fever.   No Known Allergies   Current Outpatient Prescriptions:  .  fluticasone (FLONASE) 50 MCG/ACT nasal spray, Place 2 sprays into both nostrils daily., Disp: 16 g, Rfl: 6 .  hydrOXYzine (VISTARIL) 100 MG capsule, Take 1 capsule (100 mg total) by mouth 3 (three) times daily as needed for itching., Disp: 30 capsule, Rfl: 0 .  ibuprofen (ADVIL,MOTRIN) 600 MG tablet, 600mg  3x daily for two weeks., Disp: 42 tablet, Rfl: 0  Review of Systems  Constitutional: Positive for activity change and fatigue.  Gastrointestinal: Positive for nausea and vomiting.  Musculoskeletal: Positive for arthralgias, back pain and myalgias.  Neurological: Positive for weakness and numbness.    Social History  Substance Use Topics  . Smoking status: Never Smoker  . Smokeless tobacco: Never Used  . Alcohol use No   Objective:   BP 110/74 (BP Location: Left  Arm, Patient Position: Sitting, Cuff Size: Normal)   Pulse 72   Temp 98.6 F (37 C)   Resp 16   Wt 134 lb (60.8 kg)   BMI 24.51 kg/m   Physical Exam  Constitutional: She is oriented to person, place, and time. She appears well-developed and well-nourished.  Patient is upset in exam room.   HENT:  Head: Normocephalic.  Eyes: Pupils are equal, round, and reactive to light.  Neck: Normal range of motion. Neck supple. No spinous process tenderness and no muscular tenderness present. No neck rigidity. No edema and normal range of motion present. No Brudzinski's sign and no Kernig's sign noted.  Cardiovascular: Normal rate, regular rhythm and intact distal pulses.   Pulmonary/Chest: Effort normal and breath sounds normal. No respiratory distress.  Abdominal: Soft. Bowel sounds are normal.  Musculoskeletal: Normal range of motion. She exhibits no edema, tenderness or deformity.       Right shoulder: Normal.       Left shoulder: Normal.       Right elbow: Normal.      Left elbow: Normal.       Right wrist: Normal.       Left wrist: Normal.       Right hip: Normal.       Left hip: Normal.       Right knee: Normal.       Left knee: Normal.  Right ankle: Normal.       Left ankle: Normal.       Cervical back: Normal.       Thoracic back: Normal.       Lumbar back: Normal.       Right upper arm: Normal.       Left upper arm: Normal.       Right forearm: Normal.       Left forearm: Normal.       Right hand: Normal.       Left hand: Normal.       Right upper leg: Normal.       Left upper leg: Normal.       Right lower leg: Normal.       Left lower leg: Normal.       Right foot: Normal.       Left foot: Normal.  Patient's arms do in fact move in a posterior direction, patient reports this causes her pain.  Lymphadenopathy:    She has no cervical adenopathy.  Neurological: She is alert and oriented to person, place, and time. She has normal strength and normal reflexes. She  displays normal reflexes. No cranial nerve deficit or sensory deficit. She exhibits normal muscle tone. Coordination and gait normal. GCS eye subscore is 4. GCS verbal subscore is 5. GCS motor subscore is 6.  Reflex Scores:      Tricep reflexes are 2+ on the left side.      Bicep reflexes are 2+ on the right side.      Patellar reflexes are 2+ on the right side and 2+ on the left side.      Achilles reflexes are 2+ on the right side and 2+ on the left side. Patient is neurovascularly intact.   Skin: Skin is warm and dry.        Assessment & Plan:      Problem List Items Addressed This Visit    None    Visit Diagnoses    Numbness    -  Primary   Relevant Orders   Ambulatory referral to Neurology   POCT glycosylated hemoglobin (Hb A1C) (Completed)   Bilateral thoracic back pain, unspecified chronicity       Relevant Medications   ketorolac (TORADOL) injection 60 mg (Completed)   Acute anxiety       Relevant Medications   hydrOXYzine (VISTARIL) 100 MG capsule     Patient is a 20 y/o with anxiety and PTSD coming in today complaining of worsening back pain. Thoracic XRAY last visit showed no acute fx or DDD. Did have a sclerotic lesion for which I have scheduled a f/u CXR. Patient reports ibuprofen only helps pain for 2 hours, then it returns. Have provided Toradol IM today in clinic for pain control. Patient markedly happier upon my returning to room after administration. Patient may resume with ibuprofen tomorrow. Will provide hydroxyzine for anxiety related to these symptoms. Patient cautioned not to drive with this medication.   Patient reports her urge to move legs at night has improved with the medication I prescribed her. I did not prescribe medication for this, but counseled on caffeine elimination, exercise, and stress reduction. Nevertheless, I am pleased these symptoms seem to be resolving.   Patient reports new transient numbness in bilateral hands in glove pattern. This is  not according to nerve distribution that I am aware of, such as injury to brachial plexus or cervical compression. More associated with progressive systemic disease,  such as diabetes. POCT A1C today was 5.1%. Will refer to neurology.   Patient counseled that if symptoms progress or worsen she should go to emergency room as there is little I can do urgently.   Return if symptoms worsen or fail to improve, for patient needs to keep referral appointment for neurology.   The entirety of the information documented in the History of Present Illness, Review of Systems and Physical Exam were personally obtained by me. Portions of this information were initially documented by [facility] and reviewed by me for thoroughness and accuracy.   I have spent 25 minutes with this patient, >50% of which was spent on counseling and coordination of care.  Patient Instructions  Back Pain, Adult Back pain is very common in adults.The cause of back pain is rarely dangerous and the pain often gets better over time.The cause of your back pain may not be known. Some common causes of back pain include:  Strain of the muscles or ligaments supporting the spine.  Wear and tear (degeneration) of the spinal disks.  Arthritis.  Direct injury to the back. For many people, back pain may return. Since back pain is rarely dangerous, most people can learn to manage this condition on their own. HOME CARE INSTRUCTIONS Watch your back pain for any changes. The following actions may help to lessen any discomfort you are feeling:  Remain active. It is stressful on your back to sit or stand in one place for long periods of time. Do not sit, drive, or stand in one place for more than 30 minutes at a time. Take short walks on even surfaces as soon as you are able.Try to increase the length of time you walk each day.  Exercise regularly as directed by your health care provider. Exercise helps your back heal faster. It also helps avoid  future injury by keeping your muscles strong and flexible.  Do not stay in bed.Resting more than 1-2 days can delay your recovery.  Pay attention to your body when you bend and lift. The most comfortable positions are those that put less stress on your recovering back. Always use proper lifting techniques, including:  Bending your knees.  Keeping the load close to your body.  Avoiding twisting.  Find a comfortable position to sleep. Use a firm mattress and lie on your side with your knees slightly bent. If you lie on your back, put a pillow under your knees.  Avoid feeling anxious or stressed.Stress increases muscle tension and can worsen back pain.It is important to recognize when you are anxious or stressed and learn ways to manage it, such as with exercise.  Take medicines only as directed by your health care provider. Over-the-counter medicines to reduce pain and inflammation are often the most helpful.Your health care provider may prescribe muscle relaxant drugs.These medicines help dull your pain so you can more quickly return to your normal activities and healthy exercise.  Apply ice to the injured area:  Put ice in a plastic bag.  Place a towel between your skin and the bag.  Leave the ice on for 20 minutes, 2-3 times a day for the first 2-3 days. After that, ice and heat may be alternated to reduce pain and spasms.  Maintain a healthy weight. Excess weight puts extra stress on your back and makes it difficult to maintain good posture. SEEK MEDICAL CARE IF:  You have pain that is not relieved with rest or medicine.  You have increasing pain going down  into the legs or buttocks.  You have pain that does not improve in one week.  You have night pain.  You lose weight.  You have a fever or chills. SEEK IMMEDIATE MEDICAL CARE IF:   You develop new bowel or bladder control problems.  You have unusual weakness or numbness in your arms or legs.  You develop nausea  or vomiting.  You develop abdominal pain.  You feel faint.   This information is not intended to replace advice given to you by your health care provider. Make sure you discuss any questions you have with your health care provider.   Document Released: 10/11/2005 Document Revised: 11/01/2014 Document Reviewed: 02/12/2014 Elsevier Interactive Patient Education 2016 [employer]             Trey SailorsAdriana M Pollak, PA-C  [facility]

## 2016-08-19 ENCOUNTER — Ambulatory Visit: Payer: Self-pay | Admitting: Family Medicine

## 2016-09-01 ENCOUNTER — Encounter
Admission: RE | Admit: 2016-09-01 | Discharge: 2016-09-01 | Disposition: A | Discharge status: Home / Self Care | Payer: Medicaid Other | Source: Ambulatory Visit | Attending: Obstetrics and Gynecology | Admitting: Obstetrics and Gynecology

## 2016-09-01 DIAGNOSIS — Z01818 Encounter for other preprocedural examination: Secondary | ICD-10-CM | POA: Diagnosis not present

## 2016-09-01 HISTORY — DX: Nausea with vomiting, unspecified: R11.2

## 2016-09-01 HISTORY — DX: Other specified postprocedural states: Z98.890

## 2016-09-01 HISTORY — DX: Bipolar disorder, unspecified: F31.9

## 2016-09-01 LAB — COMPREHENSIVE METABOLIC PANEL
ALBUMIN: 4.4 g/dL (ref 3.5–5.0)
ALK PHOS: 29 U/L — AB (ref 38–126)
ALT: 13 U/L — ABNORMAL LOW (ref 14–54)
ANION GAP: 7 (ref 5–15)
AST: 17 U/L (ref 15–41)
BILIRUBIN TOTAL: 0.6 mg/dL (ref 0.3–1.2)
BUN: 9 mg/dL (ref 6–20)
CALCIUM: 9.6 mg/dL (ref 8.9–10.3)
CO2: 26 mmol/L (ref 22–32)
Chloride: 104 mmol/L (ref 101–111)
Creatinine, Ser: 0.5 mg/dL (ref 0.44–1.00)
GLUCOSE: 83 mg/dL (ref 65–99)
Potassium: 4 mmol/L (ref 3.5–5.1)
Sodium: 137 mmol/L (ref 135–145)
TOTAL PROTEIN: 7.3 g/dL (ref 6.5–8.1)

## 2016-09-01 LAB — TYPE AND SCREEN
ABO/RH(D): A POS
Antibody Screen: NEGATIVE

## 2016-09-01 LAB — CBC
HEMATOCRIT: 39 % (ref 35.0–47.0)
HEMOGLOBIN: 13.6 g/dL (ref 12.0–16.0)
MCH: 30.7 pg (ref 26.0–34.0)
MCHC: 34.9 g/dL (ref 32.0–36.0)
MCV: 87.9 fL (ref 80.0–100.0)
Platelets: 274 10*3/uL (ref 150–440)
RBC: 4.44 MIL/uL (ref 3.80–5.20)
RDW: 12.6 % (ref 11.5–14.5)
WBC: 8.7 10*3/uL (ref 3.6–11.0)

## 2016-09-01 NOTE — Patient Instructions (Signed)
Your procedure is scheduled on: Thursday 09/09/16 Report to Day Surgery. 2ND FLOOR MEDICAL MALL ENTRANCE To find out your arrival time please call [phone] between 1PM - 3PM on Wednesday 09/08/16.  Remember: Instructions that are not followed completely may result in serious medical risk, up to and including death, or upon the discretion of your surgeon and anesthesiologist your surgery may need to be rescheduled.    __X__ 1. Do not eat food or drink liquids after midnight. No gum chewing or hard candies.     __X__ 2. No Alcohol for 24 hours before or after surgery.   ____ 3. Bring all medications with you on the day of surgery if instructed.    __X__ 4. Notify your doctor if there is any change in your medical condition     (cold, fever, infections).     Do not wear jewelry, make-up, hairpins, clips or nail polish.  Do not wear lotions, powders, or perfumes.   Do not shave 48 hours prior to surgery. Men may shave face and neck.  Do not bring valuables to the hospital.    [facility] is not responsible for any belongings or valuables.               Contacts, dentures or bridgework may not be worn into surgery.  Leave your suitcase in the car. After surgery it may be brought to your room.  For patients admitted to the hospital, discharge time is determined by your                treatment team.   Patients discharged the day of surgery will not be allowed to drive home.   Please read over the following fact sheets that you were given:   Pain Booklet   ____ Take these medicines the morning of surgery with A SIP OF WATER:    1. NONE  2. CALL [phone] WITH NEW MED  3.   4.  5.  6.  ____ Fleet Enema (as directed)   __X__ Use CHG Soap as directed  ____ Use inhalers on the day of surgery  ____ Stop metformin 2 days prior to surgery    ____ Take 1/2 of usual insulin dose the night before surgery and none on the morning of surgery.   ____ Stop Coumadin/Plavix/aspirin  on   ____ Stop Anti-inflammatories on    ____ Stop supplements until after surgery.    ____ Bring C-Pap to the hospital.

## 2016-09-09 ENCOUNTER — Ambulatory Visit: Payer: Medicaid Other | Admitting: Certified Registered"

## 2016-09-09 ENCOUNTER — Encounter
Admission: RE | Disposition: A | Discharge status: Home / Self Care | Payer: Self-pay | Source: Ambulatory Visit | Attending: Obstetrics and Gynecology

## 2016-09-09 ENCOUNTER — Ambulatory Visit
Admission: RE | Admit: 2016-09-09 | Discharge: 2016-09-09 | Disposition: A | Discharge status: Home / Self Care | Payer: Medicaid Other | Source: Ambulatory Visit | Attending: Obstetrics and Gynecology | Admitting: Obstetrics and Gynecology

## 2016-09-09 ENCOUNTER — Encounter: Payer: Self-pay | Admitting: *Deleted

## 2016-09-09 DIAGNOSIS — G8929 Other chronic pain: Secondary | ICD-10-CM | POA: Diagnosis not present

## 2016-09-09 DIAGNOSIS — N83202 Unspecified ovarian cyst, left side: Secondary | ICD-10-CM | POA: Insufficient documentation

## 2016-09-09 DIAGNOSIS — K219 Gastro-esophageal reflux disease without esophagitis: Secondary | ICD-10-CM | POA: Diagnosis not present

## 2016-09-09 DIAGNOSIS — N809 Endometriosis, unspecified: Secondary | ICD-10-CM | POA: Diagnosis not present

## 2016-09-09 DIAGNOSIS — R102 Pelvic and perineal pain: Secondary | ICD-10-CM | POA: Insufficient documentation

## 2016-09-09 HISTORY — DX: Endometriosis, unspecified: N80.9

## 2016-09-09 HISTORY — DX: Pelvic and perineal pain: R10.2

## 2016-09-09 HISTORY — PX: LAPAROSCOPY: SHX197

## 2016-09-09 HISTORY — DX: Other chronic pain: G89.29

## 2016-09-09 LAB — POCT PREGNANCY, URINE: PREG TEST UR: NEGATIVE

## 2016-09-09 SURGERY — LAPAROSCOPY, DIAGNOSTIC
Anesthesia: General | Site: Abdomen | Wound class: Clean Contaminated

## 2016-09-09 MED ORDER — PROMETHAZINE HCL 25 MG/ML IJ SOLN
6.2500 mg | INTRAMUSCULAR | Status: DC | PRN
  Administered 2016-09-09: 6.25 mg via INTRAVENOUS

## 2016-09-09 MED ORDER — MIDAZOLAM HCL 2 MG/2ML IJ SOLN
INTRAMUSCULAR | Status: DC | PRN
  Administered 2016-09-09: 2 mg via INTRAVENOUS

## 2016-09-09 MED ORDER — KETOROLAC TROMETHAMINE 30 MG/ML IJ SOLN
INTRAMUSCULAR | Status: DC | PRN
  Administered 2016-09-09: 30 mg via INTRAVENOUS

## 2016-09-09 MED ORDER — PROPOFOL 10 MG/ML IV BOLUS
INTRAVENOUS | Status: DC | PRN
  Administered 2016-09-09: 130 mg via INTRAVENOUS
  Administered 2016-09-09: 70 mg via INTRAVENOUS

## 2016-09-09 MED ORDER — FENTANYL CITRATE (PF) 100 MCG/2ML IJ SOLN: 25.0000 ug | INTRAMUSCULAR | Status: DC | PRN

## 2016-09-09 MED ORDER — ONDANSETRON HCL 4 MG/2ML IJ SOLN
INTRAMUSCULAR | Status: DC | PRN
  Administered 2016-09-09: 4 mg via INTRAVENOUS

## 2016-09-09 MED ORDER — ACETAMINOPHEN 10 MG/ML IV SOLN
INTRAVENOUS | Status: DC | PRN
  Administered 2016-09-09: 1000 mg via INTRAVENOUS

## 2016-09-09 MED ORDER — FENTANYL CITRATE (PF) 100 MCG/2ML IJ SOLN
INTRAMUSCULAR | Status: DC | PRN
  Administered 2016-09-09 (×4): 50 ug via INTRAVENOUS

## 2016-09-09 MED ORDER — ACETAMINOPHEN 10 MG/ML IV SOLN
INTRAVENOUS | Status: AC
  Filled 2016-09-09: qty 100

## 2016-09-09 MED ORDER — TRAMADOL HCL 50 MG PO TABS: 50.0000 mg | ORAL_TABLET | Freq: Four times a day (QID) | ORAL | 0 refills | Status: DC | PRN

## 2016-09-09 MED ORDER — LIDOCAINE HCL (CARDIAC) 20 MG/ML IV SOLN
INTRAVENOUS | Status: DC | PRN
  Administered 2016-09-09: 60 mg via INTRAVENOUS

## 2016-09-09 MED ORDER — LACTATED RINGERS IV SOLN
INTRAVENOUS | Status: DC
  Administered 2016-09-09 (×2): via INTRAVENOUS

## 2016-09-09 MED ORDER — IBUPROFEN 600 MG PO TABS: 600.0000 mg | ORAL_TABLET | Freq: Four times a day (QID) | ORAL | 0 refills | Status: DC | PRN

## 2016-09-09 MED ORDER — SUGAMMADEX SODIUM 200 MG/2ML IV SOLN
INTRAVENOUS | Status: DC | PRN
  Administered 2016-09-09: 122.4 mg via INTRAVENOUS

## 2016-09-09 MED ORDER — ROCURONIUM BROMIDE 100 MG/10ML IV SOLN
INTRAVENOUS | Status: DC | PRN
  Administered 2016-09-09: 30 mg via INTRAVENOUS
  Administered 2016-09-09: 10 mg via INTRAVENOUS

## 2016-09-09 MED ORDER — ONDANSETRON HCL 4 MG/2ML IJ SOLN
INTRAMUSCULAR | Status: AC
  Administered 2016-09-09: 4 mg
  Filled 2016-09-09: qty 2

## 2016-09-09 MED ORDER — SCOPOLAMINE 1 MG/3DAYS TD PT72
MEDICATED_PATCH | TRANSDERMAL | Status: AC
  Administered 2016-09-09: 1.5 mg via TRANSDERMAL
  Filled 2016-09-09: qty 1

## 2016-09-09 MED ORDER — SODIUM CHLORIDE 0.9 % IJ SOLN
INTRAMUSCULAR | Status: AC
  Filled 2016-09-09: qty 10

## 2016-09-09 MED ORDER — FAMOTIDINE 20 MG PO TABS
ORAL_TABLET | ORAL | Status: AC
  Administered 2016-09-09: 20 mg
  Filled 2016-09-09: qty 1

## 2016-09-09 MED ORDER — DEXAMETHASONE SODIUM PHOSPHATE 10 MG/ML IJ SOLN
INTRAMUSCULAR | Status: DC | PRN
  Administered 2016-09-09: 10 mg via INTRAVENOUS

## 2016-09-09 MED ORDER — BUPIVACAINE HCL 0.5 % IJ SOLN
INTRAMUSCULAR | Status: DC | PRN
  Administered 2016-09-09: 7 mL

## 2016-09-09 MED ORDER — PROMETHAZINE HCL 25 MG/ML IJ SOLN
INTRAMUSCULAR | Status: AC
  Filled 2016-09-09: qty 1

## 2016-09-09 MED ORDER — SCOPOLAMINE 1 MG/3DAYS TD PT72
1.0000 | MEDICATED_PATCH | Freq: Once | TRANSDERMAL | Status: DC
  Administered 2016-09-09: 1.5 mg via TRANSDERMAL

## 2016-09-09 MED ORDER — BUPIVACAINE HCL (PF) 0.5 % IJ SOLN
INTRAMUSCULAR | Status: AC
  Filled 2016-09-09: qty 30

## 2016-09-09 SURGICAL SUPPLY — 41 items
BAG URINE DRAINAGE (UROLOGICAL SUPPLIES) ×2 IMPLANT
BLADE SURG SZ11 CARB STEEL (BLADE) ×2 IMPLANT
CANISTER SUCT 1200ML W/VALVE (MISCELLANEOUS) ×2 IMPLANT
CATH FOLEY 2WAY  5CC 16FR (CATHETERS) ×1
CATH URTH 16FR FL 2W BLN LF (CATHETERS) ×1 IMPLANT
CHLORAPREP W/TINT 26ML (MISCELLANEOUS) ×2 IMPLANT
DRAPE LEGGINS SURG 28X43 STRL (DRAPES) IMPLANT
DRAPE SHEET LG 3/4 BI-LAMINATE (DRAPES) ×2 IMPLANT
DRAPE UNDER BUTTOCK W/FLU (DRAPES) ×2 IMPLANT
ELECT REM PT RETURN 9FT ADLT (ELECTROSURGICAL) ×2
ELECTRODE REM PT RTRN 9FT ADLT (ELECTROSURGICAL) ×1 IMPLANT
GLOVE BIO SURGEON STRL SZ7 (GLOVE) ×4 IMPLANT
GLOVE BIOGEL PI IND STRL 7.5 (GLOVE) ×1 IMPLANT
GLOVE BIOGEL PI INDICATOR 7.5 (GLOVE) ×1
GOWN STRL REUS W/ TWL LRG LVL3 (GOWN DISPOSABLE) ×2 IMPLANT
GOWN STRL REUS W/TWL LRG LVL3 (GOWN DISPOSABLE) ×2
IRRIGATION STRYKERFLOW (MISCELLANEOUS) ×1 IMPLANT
IRRIGATOR STRYKERFLOW (MISCELLANEOUS) ×2
IV LACTATED RINGERS 1000ML (IV SOLUTION) ×2 IMPLANT
KIT RM TURNOVER CYSTO AR (KITS) ×2 IMPLANT
LABEL OR SOLS (LABEL) ×2 IMPLANT
LIQUID BAND (GAUZE/BANDAGES/DRESSINGS) ×2 IMPLANT
NDL SAFETY 22GX1.5 (NEEDLE) ×2 IMPLANT
NS IRRIG 500ML POUR BTL (IV SOLUTION) ×2 IMPLANT
PACK LAP CHOLECYSTECTOMY (MISCELLANEOUS) ×2 IMPLANT
PAD OB MATERNITY 4.3X12.25 (PERSONAL CARE ITEMS) ×2 IMPLANT
PAD PREP 24X41 OB/GYN DISP (PERSONAL CARE ITEMS) ×2 IMPLANT
SCISSORS METZENBAUM CVD 33 (INSTRUMENTS) ×2 IMPLANT
SHEARS HARMONIC ACE PLUS 36CM (ENDOMECHANICALS) IMPLANT
SLEEVE ENDOPATH XCEL 5M (ENDOMECHANICALS) ×2 IMPLANT
SOL PREP PVP 2OZ (MISCELLANEOUS) ×2
SOLUTION PREP PVP 2OZ (MISCELLANEOUS) ×1 IMPLANT
SURGILUBE 2OZ TUBE FLIPTOP (MISCELLANEOUS) ×2 IMPLANT
SUT MNCRL 4-0 (SUTURE) ×1
SUT MNCRL 4-0 27XMFL (SUTURE) ×1
SUT VIC AB 2-0 UR6 27 (SUTURE) IMPLANT
SUTURE MNCRL 4-0 27XMF (SUTURE) ×1 IMPLANT
TROCAR ENDO BLADELESS 11MM (ENDOMECHANICALS) IMPLANT
TROCAR XCEL NON-BLD 5MMX100MML (ENDOMECHANICALS) ×2 IMPLANT
TROCAR XCEL UNIV SLVE 11M 100M (ENDOMECHANICALS) ×2 IMPLANT
TUBING INSUFFLATOR HI FLOW (MISCELLANEOUS) ×2 IMPLANT

## 2016-09-09 NOTE — Anesthesia Preprocedure Evaluation (Signed)
Anesthesia Evaluation  Patient identified by MRN, date of birth, ID band Patient awake    Reviewed: Allergy & Precautions, H&P , NPO status , Patient's Chart, lab work & pertinent test results  History of Anesthesia Complications (+) PONV and history of anesthetic complications  Airway Mallampati: II  TM Distance: >3 FB Neck ROM: full    Dental no notable dental hx. (+) Poor Dentition, Chipped   Pulmonary neg shortness of breath, asthma ,    Pulmonary exam normal breath sounds clear to auscultation       Cardiovascular Exercise Tolerance: Good (-) angina(-) Past MI and (-) DOE negative cardio ROS Normal cardiovascular exam Rhythm:regular Rate:Normal     Neuro/Psych PSYCHIATRIC DISORDERS Anxiety Depression Bipolar Disorder negative neurological ROS     GI/Hepatic Neg liver ROS, GERD  Controlled,  Endo/Other  negative endocrine ROS  Renal/GU      Musculoskeletal   Abdominal   Peds  Hematology negative hematology ROS (+)   Anesthesia Other Findings Past Medical History: No date: Anxiety No date: Asthma     Comment: sports induced No date: Bipolar disorder (HCC) No date: Depression No date: Ovarian cyst No date: Pilonidal cyst No date: PONV (postoperative nausea and vomiting)  Past Surgical History: No date: COLONOSCOPY WITH ESOPHAGOGASTRODUODENOSCOPY (E* 04/10/2015: PILONIDAL CYST EXCISION N/A     Comment: Procedure: CYST EXCISION PILONIDAL EXTENSIVE;               Surgeon: Kieth BrightlySeeplaputhur G Sankar, MD;  Location:               [facility] ORS;  Service: General;  Laterality: N/A;  BMI    Body Mass Index:  24.69 kg/m      Reproductive/Obstetrics negative OB ROS                             Anesthesia Physical Anesthesia Plan  ASA: III  Anesthesia Plan: General ETT   Post-op Pain Management:    Induction:   Airway Management Planned:   Additional Equipment:   Intra-op Plan:    Post-operative Plan:   Informed Consent: I have reviewed the patients History and Physical, chart, labs and discussed the procedure including the risks, benefits and alternatives for the proposed anesthesia with the patient or authorized representative who has indicated his/her understanding and acceptance.   Dental Advisory Given  Plan Discussed with: Anesthesiologist, CRNA and Surgeon  Anesthesia Plan Comments:         Anesthesia Quick Evaluation

## 2016-09-09 NOTE — Anesthesia Postprocedure Evaluation (Signed)
Anesthesia Post Note  Patient: Madison Dixon  Procedure(s) Performed: Procedure(s) (LRB): LAPAROSCOPY DIAGNOSTIC, FULGERATION OF ENDOMETRIOSIS (N/A)  Patient location during evaluation: PACU Anesthesia Type: General Level of consciousness: awake and alert Pain management: pain level controlled Vital Signs Assessment: post-procedure vital signs reviewed and stable Respiratory status: spontaneous breathing, nonlabored ventilation, respiratory function stable and patient connected to nasal cannula oxygen Cardiovascular status: blood pressure returned to baseline and stable Postop Assessment: no signs of nausea or vomiting Anesthetic complications: no    Last Vitals:  Vitals:   09/09/16 1929 09/09/16 1936  BP: (!) 107/56 110/76  Pulse: 63   Resp: (!) 27   Temp:      Last Pain:  Vitals:   09/09/16 1936  TempSrc:   PainSc: 0-No pain                 Cleda MccreedyJoseph K Piscitello

## 2016-09-09 NOTE — Op Note (Signed)
  Operative Note    Pre-Op Diagnosis: chronic pelvic pain  Post-Op Diagnosis:  Chronic pelvic pain endometriosis  Procedures:  1. Diagnostic laparoscopy 2. Fulguration of endometriosis implants  Primary Surgeon: Thomasene MohairStephen Delman Goshorn, MD   EBL: 30 mL   IVF: 1,000 mL crystalloid  Urine output: 50 mL  Specimens: None  Drains: None  Complications: None   Disposition: PACU   Condition: Stable   Findings:  1) normal-appearing uterus, fallopian tubes, and right ovary 2) simple cyst on left ovary, ~3cm 3) adhesions of epiploica to anterior abdominal wall in right lower quadrant, no evidence of current inflammation 4) normal-appearing appendix 5) several small powder burn lesions in the posterior cul-de-sac  Procedure Summary:  The patient was taken to the operating room where general anesthesia was administered and found to be adequate. She was placed in the dorsal supine lithotomy position in [place] stirrups and prepped and draped in usual sterile fashion. After a timeout was called an indwelling catheter was placed in her bladder. A sterile speculum was placed in the vagina and a Hulka tenaculum was affixed to the cervix. The speculum was removed from the vagina.  Attention was turned to the abdomen where after injection of local anesthetic, a 5 mm infraumbilical incision was made with the scalpel. Entry into the abdomen was obtained via Optiview trocar technique (a blunt entry technique with camera visualization through the obturator upon entry). Verification of entry into the abdomen was obtained using opening pressures. The abdomen was insufflated with CO2. The camera was introduced through the trocar with verification of atraumatic entry.  A survey of the pelvic and abdominal cavities was undertaken. Due to visibility limitations, a suprapubic 5 mm trocar was placed under direct intra-abdominal camera visualization without difficulty. The pelvic and abdominal survey was completed  with the above-noted findings. The cyst on the left ovary was opened and drained. However, given that it was a simple cyst, the cyst wall was not removed. The endometriosis implants were fulgurated using monopolar electrocautery as none were directly overlying any pelvic structures. With hemostasis noted and no other findings, the procedure was terminated at this point.  The CO2 was removed from the abdomen and 5 deep breaths were given by anesthesia to help with this process. The trochars were removed without difficulty and the subcutaneous tissue was reapproximated with 4-0 Vicryl. The skin was closed using surgical skin glue.  Attention was returned to the vagina where a sterile speculum was placed and the Hulka tenaculum was dislodged from the cervix causing a small rent in the cervix. This was made hemostatic using silver nitrate.  The sterile speculum and catheter were removed from the vagina and bladder, respectively.  The patient tolerated the procedure well.  Sponge, lap, needle, and instrument counts were correct x 2.  VTE prophylaxis: SCDs. Antibiotic prophylaxis: none indicated nor given. She was awakened in the operating room and was taken to the PACU in stable condition.   Thomasene MohairStephen Yosef Krogh, MD 09/09/2016 6:45 PM

## 2016-09-09 NOTE — Discharge Instructions (Signed)
AMBULATORY SURGERY  °DISCHARGE INSTRUCTIONS ° ° °1) The drugs that you were given will stay in your system until tomorrow so for the next 24 hours you should not: ° °A) Drive an automobile °B) Make any legal decisions °C) Drink any alcoholic beverage ° ° °2) You may resume regular meals tomorrow.  Today it is better to start with liquids and gradually work up to solid foods. ° °You may eat anything you prefer, but it is better to start with liquids, then soup and crackers, and gradually work up to solid foods. ° ° °3) Please notify your doctor immediately if you have any unusual bleeding, trouble breathing, redness and pain at the surgery site, drainage, fever, or pain not relieved by medication. ° ° ° °4) Additional Instructions: ° ° ° ° ° ° ° °Please contact your physician with any problems or Same Day Surgery at [phone], Monday through Friday 6 am to 4 pm, or [place] [facility] [place] Main number at [phone]. °

## 2016-09-09 NOTE — Anesthesia Procedure Notes (Signed)
Procedure Name: Intubation Performed by: Malva CoganBEANE, Sara Selvidge Pre-anesthesia Checklist: Patient identified, Patient being monitored, Timeout performed, Emergency Drugs available and Suction available Patient Re-evaluated:Patient Re-evaluated prior to inductionOxygen Delivery Method: Circle system utilized Preoxygenation: Pre-oxygenation with 100% oxygen Intubation Type: IV induction Ventilation: Mask ventilation without difficulty Laryngoscope Size: Mac and 3 Grade View: Grade I Tube type: Oral Tube size: 7.0 mm Number of attempts: 1 Airway Equipment and Method: Stylet Placement Confirmation: ETT inserted through vocal cords under direct vision,  positive ETCO2 and breath sounds checked- equal and bilateral Secured at: 22 cm Tube secured with: Tape Dental Injury: Teeth and Oropharynx as per pre-operative assessment

## 2016-09-09 NOTE — Transfer of Care (Signed)
Immediate Anesthesia Transfer of Care Note  Patient: Madison Dixon  Procedure(s) Performed: Procedure(s): LAPAROSCOPY DIAGNOSTIC, FULGERATION OF ENDOMETRIOSIS (N/A)  Patient Location: PACU  Anesthesia Type:General  Level of Consciousness: awake, alert  and oriented  Airway & Oxygen Therapy: Patient Spontanous Breathing and Patient connected to face mask oxygen  Post-op Assessment: Report given to RN and Post -op Vital signs reviewed and stable  Post vital signs: Reviewed and stable  Last Vitals:  Vitals:   09/09/16 1507  BP: 118/79  Pulse: 91  Resp: 16  Temp: 36.9 C    Last Pain:  Vitals:   09/09/16 1507  TempSrc: Oral  PainSc: 6       Patients Stated Pain Goal: 2 (09/09/16 1507)  Complications: No apparent anesthesia complications

## 2016-09-09 NOTE — OR Nursing (Signed)
Patient c/o some nausea, Zofran and Phenergan given

## 2016-09-09 NOTE — H&P (Signed)
History and Physical Interval Note:  Madison ConnersKayleigh M Dixon  has presented today for surgery, with the diagnosis of CHRONIC PELVIC PAIN  The various methods of treatment have been discussed with the patient and family. After consideration of risks, benefits and other options for treatment, the patient has consented to  Procedure(s): LAPAROSCOPY DIAGNOSTIC (N/A) as a surgical intervention .  The patient's history has been reviewed, patient examined, no change in status, stable for surgery.  I have reviewed the patient's chart and labs.  Questions were answered to the patient's satisfaction.    Conard NovakJackson, Kajah Santizo D, MD 09/09/2016 4:02 PM

## 2016-09-10 ENCOUNTER — Encounter: Payer: Self-pay | Admitting: Obstetrics and Gynecology

## 2016-09-20 ENCOUNTER — Emergency Department
Admission: EM | Admit: 2016-09-20 | Discharge: 2016-09-20 | Disposition: A | Discharge status: Home / Self Care | Payer: Medicaid Other | Attending: Emergency Medicine | Admitting: Emergency Medicine

## 2016-09-20 ENCOUNTER — Emergency Department: Payer: Medicaid Other

## 2016-09-20 ENCOUNTER — Encounter: Payer: Self-pay | Admitting: Emergency Medicine

## 2016-09-20 DIAGNOSIS — J45909 Unspecified asthma, uncomplicated: Secondary | ICD-10-CM | POA: Insufficient documentation

## 2016-09-20 DIAGNOSIS — R102 Pelvic and perineal pain: Secondary | ICD-10-CM

## 2016-09-20 DIAGNOSIS — R1031 Right lower quadrant pain: Secondary | ICD-10-CM | POA: Diagnosis present

## 2016-09-20 DIAGNOSIS — R110 Nausea: Secondary | ICD-10-CM

## 2016-09-20 DIAGNOSIS — N390 Urinary tract infection, site not specified: Secondary | ICD-10-CM | POA: Diagnosis not present

## 2016-09-20 DIAGNOSIS — F1290 Cannabis use, unspecified, uncomplicated: Secondary | ICD-10-CM | POA: Insufficient documentation

## 2016-09-20 DIAGNOSIS — R509 Fever, unspecified: Secondary | ICD-10-CM

## 2016-09-20 DIAGNOSIS — Z79899 Other long term (current) drug therapy: Secondary | ICD-10-CM | POA: Insufficient documentation

## 2016-09-20 LAB — URINALYSIS COMPLETE WITH MICROSCOPIC (ARMC ONLY)
BACTERIA UA: NONE SEEN
Bilirubin Urine: NEGATIVE
Glucose, UA: NEGATIVE mg/dL
Hgb urine dipstick: NEGATIVE
KETONES UR: NEGATIVE mg/dL
Nitrite: NEGATIVE
PH: 6 (ref 5.0–8.0)
PROTEIN: NEGATIVE mg/dL
Specific Gravity, Urine: 1.021 (ref 1.005–1.030)

## 2016-09-20 LAB — CBC
HEMATOCRIT: 39.5 % (ref 35.0–47.0)
HEMOGLOBIN: 14 g/dL (ref 12.0–16.0)
MCH: 31 pg (ref 26.0–34.0)
MCHC: 35.4 g/dL (ref 32.0–36.0)
MCV: 87.7 fL (ref 80.0–100.0)
Platelets: 317 10*3/uL (ref 150–440)
RBC: 4.5 MIL/uL (ref 3.80–5.20)
RDW: 12.6 % (ref 11.5–14.5)
WBC: 9.1 10*3/uL (ref 3.6–11.0)

## 2016-09-20 LAB — LIPASE, BLOOD: LIPASE: 12 U/L (ref 11–51)

## 2016-09-20 LAB — COMPREHENSIVE METABOLIC PANEL
ALBUMIN: 4.5 g/dL (ref 3.5–5.0)
ALK PHOS: 32 U/L — AB (ref 38–126)
ALT: 16 U/L (ref 14–54)
ANION GAP: 7 (ref 5–15)
AST: 16 U/L (ref 15–41)
BUN: 11 mg/dL (ref 6–20)
CHLORIDE: 104 mmol/L (ref 101–111)
CO2: 25 mmol/L (ref 22–32)
Calcium: 9.4 mg/dL (ref 8.9–10.3)
Creatinine, Ser: 0.67 mg/dL (ref 0.44–1.00)
GFR calc non Af Amer: >60 mL/min (ref >60)
GLUCOSE: 87 mg/dL (ref 65–99)
POTASSIUM: 3.8 mmol/L (ref 3.5–5.1)
SODIUM: 136 mmol/L (ref 135–145)
Total Bilirubin: 1.2 mg/dL (ref 0.3–1.2)
Total Protein: 7.6 g/dL (ref 6.5–8.1)

## 2016-09-20 LAB — POCT PREGNANCY, URINE: PREG TEST UR: NEGATIVE

## 2016-09-20 MED ORDER — IOPAMIDOL (ISOVUE-300) INJECTION 61%
100.0000 mL | Freq: Once | INTRAVENOUS | Status: AC | PRN
  Administered 2016-09-20: 100 mL via INTRAVENOUS

## 2016-09-20 MED ORDER — IOPAMIDOL (ISOVUE-300) INJECTION 61%: 15.0000 mL | INTRAVENOUS | Status: AC

## 2016-09-20 MED ORDER — ONDANSETRON HCL 4 MG/2ML IJ SOLN
4.0000 mg | Freq: Once | INTRAMUSCULAR | Status: AC
  Administered 2016-09-20: 4 mg via INTRAVENOUS

## 2016-09-20 MED ORDER — FENTANYL CITRATE (PF) 100 MCG/2ML IJ SOLN
50.0000 ug | Freq: Once | INTRAMUSCULAR | Status: AC
  Administered 2016-09-20: 50 ug via INTRAVENOUS
  Filled 2016-09-20: qty 2

## 2016-09-20 MED ORDER — ONDANSETRON HCL 4 MG/2ML IJ SOLN
INTRAMUSCULAR | Status: AC
  Administered 2016-09-20: 4 mg via INTRAVENOUS
  Filled 2016-09-20: qty 2

## 2016-09-20 MED ORDER — SODIUM CHLORIDE 0.9 % IV BOLUS (SEPSIS)
1000.0000 mL | Freq: Once | INTRAVENOUS | Status: AC
  Administered 2016-09-20: 1000 mL via INTRAVENOUS

## 2016-09-20 MED ORDER — ONDANSETRON HCL 4 MG/2ML IJ SOLN
4.0000 mg | Freq: Once | INTRAMUSCULAR | Status: AC
  Administered 2016-09-20: 4 mg via INTRAVENOUS
  Filled 2016-09-20: qty 2

## 2016-09-20 MED ORDER — CEPHALEXIN 500 MG PO CAPS: 500.0000 mg | ORAL_CAPSULE | Freq: Four times a day (QID) | ORAL | 0 refills | Status: AC

## 2016-09-20 NOTE — ED Provider Notes (Signed)
-----------------------------------------   5:35 PM on 09/20/2016 -----------------------------------------  Signed out to me at 4 pm awaiting ct. CT ultrasound and blood work reassuring patient may have a small UTI we'll treat her for this. Did discuss with Dr. Chauncey CruelStabler who is on-call for Dr. Jean RosenthalJackson. Reviewed all the findings with him. He feels the patient stay for follow-up appointment with discharge.   Jeanmarie PlantJames A Arlita Buffkin, MD 09/20/16 [phone]

## 2016-09-20 NOTE — ED Notes (Signed)
Patient transported to Ultrasound 

## 2016-09-20 NOTE — Discharge Instructions (Addendum)
You may take Tylenol or Motrin for pain.   Return to the emergency department if you develop severe pain, fever, inability to keep down fluids, lightheadedness or fainting, or any other symptoms concerning to you.

## 2016-09-20 NOTE — ED Provider Notes (Signed)
[facility] Emergency Department Provider Note  ____________________________________________  Time seen: Approximately 3:45 PM  I have reviewed the triage vital signs and the nursing notes.   HISTORY  Chief Complaint Abdominal Pain    HPI Madison Dixon is a 20 y.o. female with a history of ovarian cyst and endometriosis, s/p exploratory laparoscopy 11/16presenting for left and right lower abdominal pain. The patient reports that she did well post surgically. Over the past 2 days, she has developed left and right lower quadrant pain associated with nausea. She is sexually active but does not note any change in her vaginal discharge. She has not had any fever, chills. She has not had any vomiting or diarrhea, dysuria, or urinary frequency. She reports a subjective fever last night.   Past Medical History:  Diagnosis Date  . Anxiety   . Asthma    sports induced  . Bipolar disorder (HCC)   . Chronic pelvic pain in female 09/09/2016  . Depression   . Endometriosis determined by laparoscopy 09/09/2016  . Ovarian cyst   . Pilonidal cyst   . PONV (postoperative nausea and vomiting)     Patient Active Problem List   Diagnosis Date Noted  . Chronic pelvic pain in female 09/09/2016  . Endometriosis determined by laparoscopy 09/09/2016  . Anxiety disorder 12/05/2015  . PTSD (post-traumatic stress disorder) 11/10/2015  . Allergic rhinitis 05/10/2008  . Asthma, exogenous 05/10/2008    Past Surgical History:  Procedure Laterality Date  . COLONOSCOPY WITH ESOPHAGOGASTRODUODENOSCOPY (EGD)    . LAPAROSCOPY N/A 09/09/2016   Procedure: LAPAROSCOPY DIAGNOSTIC, FULGERATION OF ENDOMETRIOSIS;  Surgeon: Conard Novak, MD;  Location: [facility] ORS;  Service: Gynecology;  Laterality: N/A;  . PILONIDAL CYST EXCISION N/A 04/10/2015   Procedure: CYST EXCISION PILONIDAL EXTENSIVE;  Surgeon: Kieth Brightly, MD;  Location: [facility] ORS;  Service: General;  Laterality: N/A;     Current Outpatient Rx  . Order #: [id number] Class: Print  . Order #: [id number] Class: Historical Med  . Order #: [id number] Class: Historical Med  . Order #: [id number] Class: Print    Allergies Vicodin [hydrocodone-acetaminophen]  Family History  Problem Relation Age of Onset  . Hyperlipidemia Father   . GER disease Father     Social History Social History  Substance Use Topics  . Smoking status: Never Smoker  . Smokeless tobacco: Never Used  . Alcohol use No    Review of Systems Constitutional: Subjective fever. No lightheadedness or syncope. Eyes: No visual changes. ENT: No sore throat. No congestion or rhinorrhea. Cardiovascular: Denies chest pain. Denies palpitations. Respiratory: Denies shortness of breath.  No cough. Gastrointestinal: Positive right and left lower quadrant abdominal pain.  Positive nausea, no vomiting.  No diarrhea.  No constipation. Genitourinary: Negative for dysuria. No change in vaginal discharge. Musculoskeletal: Negative for back pain. Skin: Negative for rash. Neurological: Negative for headaches. No focal numbness, tingling or weakness.   10-point ROS otherwise negative.  ____________________________________________   PHYSICAL EXAM:  VITAL SIGNS: ED Triage Vitals  Enc Vitals Group     BP 09/20/16 1045 122/70     Pulse Rate 09/20/16 1045 87     Resp 09/20/16 1045 20     Temp 09/20/16 1045 98.2 F (36.8 C)     Temp Source 09/20/16 1045 Oral     SpO2 09/20/16 1045 98 %     Weight 09/20/16 1046 135 lb (61.2 kg)     Height 09/20/16 1046 5\' 2"  (1.575 m)     Head Circumference --  Peak Flow --      Pain Score 09/20/16 1046 9     Pain Loc --      Pain Edu? --      Excl. in GC? --     Constitutional: Alert and oriented. Well appearing and in no acute distress. Answers questions appropriately. Eyes: Conjunctivae are normal.  EOMI. No scleral icterus. Head: Atraumatic. Nose: No congestion/rhinnorhea. Mouth/Throat: Mucous  membranes are moist.  Neck: No stridor.  Supple.   Cardiovascular: Normal rate, regular rhythm. No murmurs, rubs or gallops.  Respiratory: Normal respiratory effort.  No accessory muscle use or retractions. Lungs CTAB.  No wheezes, rales or ronchi. Gastrointestinal: Soft,  and nondistended.  Mild tenderness to palpation in the right and left lower quadrants closer to the pelvis. No guarding or rebound.  No peritoneal signs. Musculoskeletal: No LE edema.  Neurologic:  A&Ox3.  Speech is clear.  Face and smile are symmetric.  EOMI.  Moves all extremities well. Skin:  Skin is warm, dry and intact. No rash noted. Psychiatric: Mood and affect are normal. Speech and behavior are normal.  Normal judgement.  ____________________________________________   LABS (all labs ordered are listed, but only abnormal results are displayed)  Labs Reviewed  COMPREHENSIVE METABOLIC PANEL - Abnormal; Notable for the following:       Result Value   Alkaline Phosphatase 32 (*)    All other components within normal limits  URINALYSIS COMPLETEWITH MICROSCOPIC ([facility] ONLY) - Abnormal; Notable for the following:    Color, Urine YELLOW (*)    APPearance HAZY (*)    Leukocytes, UA 2+ (*)    Squamous Epithelial / LPF 6-30 (*)    All other components within normal limits  LIPASE, BLOOD  CBC  POC URINE PREG, ED  POCT PREGNANCY, URINE   ____________________________________________  EKG  Not indicated ____________________________________________  RADIOLOGY  [place] Transvaginal Non-ob  Result Date: 09/20/2016 CLINICAL DATA:  Lower abdominal pain for 1 day. Laparoscopy on 09/09/2016 for endometriosis. EXAM: TRANSABDOMINAL AND TRANSVAGINAL ULTRASOUND OF PELVIS TECHNIQUE: Both transabdominal and transvaginal ultrasound examinations of the pelvis were performed. Transabdominal technique was performed for global imaging of the pelvis including uterus, ovaries, adnexal regions, and pelvic cul-de-sac. It was necessary to  proceed with endovaginal exam following the transabdominal exam to visualize the uterus, endometrium and right ovary. COMPARISON:  07/14/2016 FINDINGS: Uterus Measurements: 5.7 x 2.7 x 4.1 cm. No fibroids or other mass visualized. Endometrium Thickness: 5 mm.  No focal abnormality visualized. Right ovary Measurements: 3.8 x 2.4 x 2.6 cm. Normal appearance/no adnexal mass. Left ovary Measurements: 3.6 x 2.6 x 2.2 cm. Normal appearance/no adnexal mass. Other findings No abnormal free fluid. IMPRESSION: Unremarkable pelvic ultrasound. Electronically Signed   By: Sebastian AcheAllen  Grady M.D.   On: 09/20/2016 15:36   [place] Pelvis Complete  Result Date: 09/20/2016 CLINICAL DATA:  Lower abdominal pain for 1 day. Laparoscopy on 09/09/2016 for endometriosis. EXAM: TRANSABDOMINAL AND TRANSVAGINAL ULTRASOUND OF PELVIS TECHNIQUE: Both transabdominal and transvaginal ultrasound examinations of the pelvis were performed. Transabdominal technique was performed for global imaging of the pelvis including uterus, ovaries, adnexal regions, and pelvic cul-de-sac. It was necessary to proceed with endovaginal exam following the transabdominal exam to visualize the uterus, endometrium and right ovary. COMPARISON:  07/14/2016 FINDINGS: Uterus Measurements: 5.7 x 2.7 x 4.1 cm. No fibroids or other mass visualized. Endometrium Thickness: 5 mm.  No focal abnormality visualized. Right ovary Measurements: 3.8 x 2.4 x 2.6 cm. Normal appearance/no adnexal mass. Left ovary Measurements: 3.6  x 2.6 x 2.2 cm. Normal appearance/no adnexal mass. Other findings No abnormal free fluid. IMPRESSION: Unremarkable pelvic ultrasound. Electronically Signed   By: Sebastian AcheAllen  Grady M.D.   On: 09/20/2016 15:36    ____________________________________________   PROCEDURES  Procedure(s) performed: None  Procedures  Critical Care performed: No ____________________________________________   INITIAL IMPRESSION / ASSESSMENT AND PLAN / ED COURSE  Pertinent labs &  imaging results that were available during my care of the patient were reviewed by me and considered in my medical decision making (see chart for details).  20 y.o. female with recent exploratory laparoscopy for endometriosis and ovarian cyst presenting with 2 days of lower bilateral abdominal pain with nausea and subjective fever. Overall, the patient is well-appearing with stable vital signs and is afebrile here. Her exam does show some lower abdominal discomfort that is nonfocal to the right or left side. Full etiology is her possible including musculoskeletal pain from her surgery, new ovarian cysts, early appendicitis, diverticulitis, infection or pelvic abscess.  I'll plan to get a CT abdomen and ultrasound of the pelvis for further evaluation. Initiate symptomatically treatment  ----------------------------------------- 3:50 PM on 09/20/2016 -----------------------------------------  The patient's laboratory studies are reassuring, her urinalysis does not show infection, and her ultrasound does not show any acute process in the ovaries. I'm awaiting the results of her CT scan. The patient was signed out to Dr. Alphonzo LemmingsMcShane, for final disposition.  ____________________________________________  FINAL CLINICAL IMPRESSION(S) / ED DIAGNOSES  Final diagnoses:  Pelvic pain  Nausea without vomiting  Fever, unspecified fever cause    Clinical Course       NEW MEDICATIONS STARTED DURING THIS VISIT:  New Prescriptions   No medications on file      Rockne MenghiniAnne-Caroline Rafe Mackowski, MD 09/20/16 1553

## 2016-09-20 NOTE — ED Triage Notes (Signed)
Lower abd pain since yesterday. Denies burning with urination. Did have diagnostic laparoscopy 11/16 with diagnosis of ovarian cyst and endometriosis.

## 2016-09-22 LAB — URINE CULTURE: Culture: <10000 — AB

## 2016-11-01 ENCOUNTER — Ambulatory Visit: Payer: Self-pay | Admitting: Family Medicine

## 2016-11-29 ENCOUNTER — Ambulatory Visit
Admission: RE | Admit: 2016-11-29 | Discharge: 2016-11-29 | Disposition: A | Discharge status: Home / Self Care | Payer: Medicaid Other | Source: Ambulatory Visit | Attending: Student | Admitting: Student

## 2016-11-29 ENCOUNTER — Other Ambulatory Visit
Admission: RE | Admit: 2016-11-29 | Discharge: 2016-11-29 | Disposition: A | Discharge status: Home / Self Care | Payer: Medicaid Other | Source: Ambulatory Visit | Attending: Student | Admitting: Student

## 2016-11-29 ENCOUNTER — Other Ambulatory Visit: Payer: Self-pay | Admitting: Student

## 2016-11-29 DIAGNOSIS — R1031 Right lower quadrant pain: Secondary | ICD-10-CM

## 2016-11-29 DIAGNOSIS — K439 Ventral hernia without obstruction or gangrene: Secondary | ICD-10-CM | POA: Insufficient documentation

## 2016-11-29 DIAGNOSIS — R112 Nausea with vomiting, unspecified: Secondary | ICD-10-CM | POA: Diagnosis not present

## 2016-11-29 LAB — CBC WITH DIFFERENTIAL/PLATELET
BASOS PCT: 1 %
Basophils Absolute: 0.1 10*3/uL (ref 0–0.1)
EOS ABS: 0.1 10*3/uL (ref 0–0.7)
EOS PCT: 2 %
HCT: 37.6 % (ref 35.0–47.0)
HEMOGLOBIN: 13 g/dL (ref 12.0–16.0)
LYMPHS ABS: 2.8 10*3/uL (ref 1.0–3.6)
Lymphocytes Relative: 30 %
MCH: 31.1 pg (ref 26.0–34.0)
MCHC: 34.5 g/dL (ref 32.0–36.0)
MCV: 90.1 fL (ref 80.0–100.0)
Monocytes Absolute: 0.6 10*3/uL (ref 0.2–0.9)
Monocytes Relative: 7 %
NEUTROS PCT: 62 %
Neutro Abs: 5.8 10*3/uL (ref 1.4–6.5)
PLATELETS: 325 10*3/uL (ref 150–440)
RBC: 4.17 MIL/uL (ref 3.80–5.20)
RDW: 12.4 % (ref 11.5–14.5)
WBC: 9.3 10*3/uL (ref 3.6–11.0)

## 2016-11-29 LAB — COMPREHENSIVE METABOLIC PANEL
ALT: 21 U/L (ref 14–54)
AST: 17 U/L (ref 15–41)
Albumin: 4.3 g/dL (ref 3.5–5.0)
Alkaline Phosphatase: 31 U/L — ABNORMAL LOW (ref 38–126)
Anion gap: 7 (ref 5–15)
BUN: 10 mg/dL (ref 6–20)
CALCIUM: 9.3 mg/dL (ref 8.9–10.3)
CHLORIDE: 104 mmol/L (ref 101–111)
CO2: 29 mmol/L (ref 22–32)
CREATININE: 0.7 mg/dL (ref 0.44–1.00)
Glucose, Bld: 83 mg/dL (ref 65–99)
Potassium: 3.6 mmol/L (ref 3.5–5.1)
Sodium: 140 mmol/L (ref 135–145)
Total Bilirubin: 0.9 mg/dL (ref 0.3–1.2)
Total Protein: 7.3 g/dL (ref 6.5–8.1)

## 2016-11-29 LAB — HCG, QUANTITATIVE, PREGNANCY

## 2016-11-29 MED ORDER — IOPAMIDOL (ISOVUE-300) INJECTION 61%
100.0000 mL | Freq: Once | INTRAVENOUS | Status: AC | PRN
  Administered 2016-11-29: 100 mL via INTRAVENOUS

## 2016-12-23 ENCOUNTER — Encounter: Payer: Self-pay | Admitting: Physician Assistant

## 2016-12-23 ENCOUNTER — Ambulatory Visit (INDEPENDENT_AMBULATORY_CARE_PROVIDER_SITE_OTHER): Payer: Medicaid Other | Admitting: Physician Assistant

## 2016-12-23 VITALS — BP 102/64 | HR 72 | Temp 98.3°F | Resp 16 | Wt 140.0 lb

## 2016-12-23 DIAGNOSIS — J301 Allergic rhinitis due to pollen: Secondary | ICD-10-CM | POA: Diagnosis not present

## 2016-12-23 MED ORDER — FLUTICASONE PROPIONATE 50 MCG/ACT NA SUSP: 2.0000 | Freq: Every day | NASAL | 6 refills | Status: DC

## 2016-12-23 NOTE — Progress Notes (Signed)
[facility]  Chief Complaint  Patient presents with  . URI    Subjective:    Patient ID: Madison Dixon, female    DOB: 08-31-1996, 21 y.o.   MRN: [id number]  Upper Respiratory Infection: Madison Dixon is a 21 y.o. female with a past medical history significant for Seasonal Allergiescomplaining of symptoms of a URI. Symptoms include congestion, cough and sore throat. Onset of symptoms was 2 days ago, gradually improving since that time. She also c/o congestion, nasal congestion, nausea with vomiting, no  fever and non productive cough for the past 1 day .  She is drinking plenty of fluids. Evaluation to date: none. Treatment to date: none. The treatment has provided no relief. Does feel better today and would like a refill on her Flonase.   Since I have last seen her, she is seeing Dr. Hale Bogus at [facility]. She is on lithium of unknown dose and Latuda as well. Doing well with this.  Review of Systems  Constitutional: Positive for fatigue. Negative for activity change, appetite change, chills, diaphoresis, fever and unexpected weight change.  HENT: Positive for congestion, postnasal drip, rhinorrhea and sore throat. Negative for ear discharge, ear pain, nosebleeds, sinus pain, sinus pressure and tinnitus.   Eyes: Negative.   Respiratory: Positive for cough and shortness of breath. Negative for apnea, choking, chest tightness, wheezing and stridor.   Gastrointestinal: Positive for nausea and vomiting. Negative for abdominal distention, abdominal pain, anal bleeding, blood in stool, constipation, diarrhea and rectal pain.  Musculoskeletal: Negative for neck pain and neck stiffness.  Neurological: Negative for dizziness, light-headedness and headaches.       Objective:   BP 102/64 (BP Location: Left Arm, Patient Position: Sitting, Cuff Size: Normal)   Pulse 72   Temp 98.3 F (36.8 C) (Oral)   Resp 16   Wt 140 lb (63.5 kg)    BMI 25.61 kg/m   Patient Active Problem List   Diagnosis Date Noted  . Chronic pelvic pain in female 09/09/2016  . Endometriosis determined by laparoscopy 09/09/2016  . Anxiety disorder 12/05/2015  . PTSD (post-traumatic stress disorder) 11/10/2015  . Allergic rhinitis 05/10/2008  . Asthma, exogenous 05/10/2008    Outpatient Encounter Prescriptions as of 12/23/2016  Medication Sig Note  . ibuprofen (ADVIL,MOTRIN) 600 MG tablet Take 1 tablet (600 mg total) by mouth every 6 (six) hours as needed for mild pain or cramping.   . lisdexamfetamine (VYVANSE) 20 MG capsule Take 20 mg by mouth daily. 09/20/2016: Pt says she doesn't take med as often.  . lurasidone (LATUDA) 20 MG TABS tablet Take 20 mg by mouth every evening. 09/20/2016: Pt says she doesn't take med as often  . Norethindrone-Ethinyl Estradiol-Fe Biphas (LO LOESTRIN FE) 1 MG-10 MCG / 10 MCG tablet Take 1 tablet by mouth daily.   . [DISCONTINUED] traMADol (ULTRAM) 50 MG tablet Take 1 tablet (50 mg total) by mouth every 6 (six) hours as needed for moderate pain.   . fluticasone (FLONASE) 50 MCG/ACT nasal spray Place 2 sprays into both nostrils daily.    No facility-administered encounter medications on file as of 12/23/2016.     Allergies  Allergen Reactions  . Vicodin [Hydrocodone-Acetaminophen] Nausea And Vomiting       Physical Exam  Constitutional: She appears well-developed and well-nourished.  HENT:  Right Ear: External ear normal.  Left Ear: External ear normal.  Nose: Mucosal edema and rhinorrhea present.  Mouth/Throat: Oropharynx is clear and moist. No  oropharyngeal exudate.  Eyes: Right eye exhibits discharge. Left eye exhibits discharge.  Neck: Neck supple.  Cardiovascular: Normal rate and regular rhythm.   Pulmonary/Chest: Effort normal and breath sounds normal. No respiratory distress. She has no rales.  Lymphadenopathy:    She has no cervical adenopathy.  Skin: Skin is warm and dry.  Psychiatric: She has a  normal mood and affect. Her behavior is normal.       Assessment & Plan:  1. Acute seasonal allergic rhinitis due to pollen  - fluticasone (FLONASE) 50 MCG/ACT nasal spray; Place 2 sprays into both nostrils daily.  Dispense: 16 g; Refill: 6  Work note provided  Patient Instructions  Allergic Rhinitis Allergic rhinitis is when the mucous membranes in the nose respond to allergens. Allergens are particles in the air that cause your body to have an allergic reaction. This causes you to release allergic antibodies. Through a chain of events, these eventually cause you to release histamine into the blood stream. Although meant to protect the body, it is this release of histamine that causes your discomfort, such as frequent sneezing, congestion, and an itchy, runny nose. What are the causes? Seasonal allergic rhinitis (hay fever) is caused by pollen allergens that may come from grasses, trees, and weeds. Year-round allergic rhinitis (perennial allergic rhinitis) is caused by allergens such as house dust mites, pet dander, and mold spores. What are the signs or symptoms?  Nasal stuffiness (congestion).  Itchy, runny nose with sneezing and tearing of the eyes. How is this diagnosed? Your health care provider can help you determine the allergen or allergens that trigger your symptoms. If you and your health care provider are unable to determine the allergen, skin or blood testing may be used. Your health care provider will diagnose your condition after taking your health history and performing a physical exam. Your health care provider may assess you for other related conditions, such as asthma, pink eye, or an ear infection. How is this treated? Allergic rhinitis does not have a cure, but it can be controlled by:  Medicines that block allergy symptoms. These may include allergy shots, nasal sprays, and oral antihistamines.  Avoiding the allergen. Hay fever may often be treated with  antihistamines in pill or nasal spray forms. Antihistamines block the effects of histamine. There are over-the-counter medicines that may help with nasal congestion and swelling around the eyes. Check with your health care provider before taking or giving this medicine. If avoiding the allergen or the medicine prescribed do not work, there are many new medicines your health care provider can prescribe. Stronger medicine may be used if initial measures are ineffective. Desensitizing injections can be used if medicine and avoidance does not work. Desensitization is when a patient is given ongoing shots until the body becomes less sensitive to the allergen. Make sure you follow up with your health care provider if problems continue. Follow these instructions at home: It is not possible to completely avoid allergens, but you can reduce your symptoms by taking steps to limit your exposure to them. It helps to know exactly what you are allergic to so that you can avoid your specific triggers. Contact a health care provider if:  You have a fever.  You develop a cough that does not stop easily (persistent).  You have shortness of breath.  You start wheezing.  Symptoms interfere with normal daily activities. This information is not intended to replace advice given to you by your health care provider. Make sure  you discuss any questions you have with your health care provider. Document Released: 07/06/2001 Document Revised: 06/11/2016 Document Reviewed: 06/18/2013 Elsevier Interactive Patient Education  2017 [employer]    Return if symptoms worsen or fail to improve.  The entirety of the information documented in the History of Present Illness, Review of Systems and Physical Exam were personally obtained by me. Portions of this information were initially documented by Kavin LeechLaura Ronny Ruddell, CMA and reviewed by me for thoroughness and accuracy.

## 2016-12-23 NOTE — Patient Instructions (Signed)
Allergic Rhinitis Allergic rhinitis is when the mucous membranes in the nose respond to allergens. Allergens are particles in the air that cause your body to have an allergic reaction. This causes you to release allergic antibodies. Through a chain of events, these eventually cause you to release histamine into the blood stream. Although meant to protect the body, it is this release of histamine that causes your discomfort, such as frequent sneezing, congestion, and an itchy, runny nose. What are the causes? Seasonal allergic rhinitis (hay fever) is caused by pollen allergens that may come from grasses, trees, and weeds. Year-round allergic rhinitis (perennial allergic rhinitis) is caused by allergens such as house dust mites, pet dander, and mold spores. What are the signs or symptoms?  Nasal stuffiness (congestion).  Itchy, runny nose with sneezing and tearing of the eyes. How is this diagnosed? Your health care provider can help you determine the allergen or allergens that trigger your symptoms. If you and your health care provider are unable to determine the allergen, skin or blood testing may be used. Your health care provider will diagnose your condition after taking your health history and performing a physical exam. Your health care provider may assess you for other related conditions, such as asthma, pink eye, or an ear infection. How is this treated? Allergic rhinitis does not have a cure, but it can be controlled by:  Medicines that block allergy symptoms. These may include allergy shots, nasal sprays, and oral antihistamines.  Avoiding the allergen. Hay fever may often be treated with antihistamines in pill or nasal spray forms. Antihistamines block the effects of histamine. There are over-the-counter medicines that may help with nasal congestion and swelling around the eyes. Check with your health care provider before taking or giving this medicine. If avoiding the allergen or the  medicine prescribed do not work, there are many new medicines your health care provider can prescribe. Stronger medicine may be used if initial measures are ineffective. Desensitizing injections can be used if medicine and avoidance does not work. Desensitization is when a patient is given ongoing shots until the body becomes less sensitive to the allergen. Make sure you follow up with your health care provider if problems continue. Follow these instructions at home: It is not possible to completely avoid allergens, but you can reduce your symptoms by taking steps to limit your exposure to them. It helps to know exactly what you are allergic to so that you can avoid your specific triggers. Contact a health care provider if:  You have a fever.  You develop a cough that does not stop easily (persistent).  You have shortness of breath.  You start wheezing.  Symptoms interfere with normal daily activities. This information is not intended to replace advice given to you by your health care provider. Make sure you discuss any questions you have with your health care provider. Document Released: 07/06/2001 Document Revised: 06/11/2016 Document Reviewed: 06/18/2013 Elsevier Interactive Patient Education  2017 Elsevier Inc.  

## 2017-01-12 ENCOUNTER — Ambulatory Visit: Payer: Medicaid Other | Admitting: Obstetrics and Gynecology

## 2017-02-01 ENCOUNTER — Encounter: Payer: Self-pay | Admitting: Family Medicine

## 2017-02-01 ENCOUNTER — Ambulatory Visit (INDEPENDENT_AMBULATORY_CARE_PROVIDER_SITE_OTHER): Payer: Medicaid Other | Admitting: Family Medicine

## 2017-02-01 VITALS — BP 100/58 | HR 58 | Temp 97.5°F | Resp 16 | Wt 140.2 lb

## 2017-02-01 DIAGNOSIS — R1011 Right upper quadrant pain: Secondary | ICD-10-CM

## 2017-02-01 NOTE — Patient Instructions (Signed)
We will call you with the lab results. 

## 2017-02-01 NOTE — Progress Notes (Signed)
Subjective:     Patient ID: Madison Dixon, female   DOB: 03-21-1996, 21 y.o.   MRN: [id number]  HPI  Chief Complaint  Patient presents with  . Emesis    Patient comes into office today with conerns of nausea and vomiting since 01/27/17. Patient reports that on 01/30/17 symptoms seemed to have resolved but that returned 01/31/17. Associated with vomiting paitent complains of weakness and sharp abdominal pain. Patient denies, fever, diarrhea, constipation, gas, bloating or back pain. Patient reports that she last saw G.I doctor >2 months ago.    States she has an IUD and no vaginal discharge. Has gyn f/u 4/16 with hx of endometriosis and ovarian cysts. Wishes work excuse for days missed 4/5-01/29/17. States her psychiatrist has had to lower her Lithium dose on one occasion.   Review of Systems     Objective:   Physical Exam  Constitutional: She appears well-developed and well-nourished. No distress.  Abdominal: Soft. Bowel sounds are normal. There is tenderness (moderate in RUQ and mild in upper epigastric and lower midline area). There is no guarding.       Assessment:    1. Right upper quadrant abdominal pain - Lipase - Comprehensive metabolic panel - Lithium level - CBC with Differential/Platelet    Plan:    Further f/u pending labs. Work excuse provided for 4/5-01/29/17.

## 2017-02-02 ENCOUNTER — Telehealth: Payer: Self-pay

## 2017-02-02 LAB — CBC WITH DIFFERENTIAL/PLATELET
BASOS ABS: 0 10*3/uL (ref 0.0–0.2)
Basos: 0 %
EOS (ABSOLUTE): 0.1 10*3/uL (ref 0.0–0.4)
Eos: 2 %
Hematocrit: 38.4 % (ref 34.0–46.6)
Hemoglobin: 12.9 g/dL (ref 11.1–15.9)
Immature Grans (Abs): 0 10*3/uL (ref 0.0–0.1)
Immature Granulocytes: 0 %
LYMPHS ABS: 1.9 10*3/uL (ref 0.7–3.1)
Lymphs: 28 %
MCH: 30 pg (ref 26.6–33.0)
MCHC: 33.6 g/dL (ref 31.5–35.7)
MCV: 89 fL (ref 79–97)
MONOS ABS: 0.4 10*3/uL (ref 0.1–0.9)
Monocytes: 6 %
NEUTROS ABS: 4.4 10*3/uL (ref 1.4–7.0)
Neutrophils: 64 %
Platelets: 329 10*3/uL (ref 150–379)
RBC: 4.3 x10E6/uL (ref 3.77–5.28)
RDW: 12.8 % (ref 12.3–15.4)
WBC: 6.9 10*3/uL (ref 3.4–10.8)

## 2017-02-02 LAB — COMPREHENSIVE METABOLIC PANEL
A/G RATIO: 2.1 (ref 1.2–2.2)
ALK PHOS: 29 IU/L — AB (ref 39–117)
ALT: 8 IU/L (ref 0–32)
AST: 16 IU/L (ref 0–40)
Albumin: 5 g/dL (ref 3.5–5.5)
BUN/Creatinine Ratio: 9 (ref 9–23)
BUN: 8 mg/dL (ref 6–20)
Bilirubin Total: 1.1 mg/dL (ref 0.0–1.2)
CHLORIDE: 102 mmol/L (ref 96–106)
CO2: 22 mmol/L (ref 18–29)
Calcium: 10.3 mg/dL — ABNORMAL HIGH (ref 8.7–10.2)
Creatinine, Ser: 0.92 mg/dL (ref 0.57–1.00)
GFR calc Af Amer: 104 mL/min/{1.73_m2} (ref >59)
GFR calc non Af Amer: 90 mL/min/{1.73_m2} (ref >59)
GLOBULIN, TOTAL: 2.4 g/dL (ref 1.5–4.5)
Glucose: 81 mg/dL (ref 65–99)
POTASSIUM: 4.7 mmol/L (ref 3.5–5.2)
SODIUM: 141 mmol/L (ref 134–144)
Total Protein: 7.4 g/dL (ref 6.0–8.5)

## 2017-02-02 LAB — LITHIUM LEVEL: LITHIUM LVL: 0.6 mmol/L (ref 0.6–1.2)

## 2017-02-02 LAB — LIPASE: Lipase: 12 U/L — ABNORMAL LOW (ref 14–72)

## 2017-02-02 NOTE — Telephone Encounter (Signed)
-----   Message from Anola Gurney, [place] sent at 02/02/2017 12:06 PM EDT ----- Labs ok except borderline low Lithium. Do follow up with Dr. Jean Rosenthal, gyn, as scheduled 4/16. Dr. Hale Bogus may wish to adjust your mental health medication.

## 2017-02-02 NOTE — Telephone Encounter (Signed)
Patient has been advised. KW 

## 2017-02-07 ENCOUNTER — Ambulatory Visit: Payer: Medicaid Other | Admitting: Obstetrics and Gynecology

## 2017-03-29 ENCOUNTER — Telehealth: Payer: Self-pay

## 2017-03-29 ENCOUNTER — Ambulatory Visit (INDEPENDENT_AMBULATORY_CARE_PROVIDER_SITE_OTHER): Payer: Medicaid Other | Admitting: Obstetrics and Gynecology

## 2017-03-29 ENCOUNTER — Encounter: Payer: Self-pay | Admitting: Obstetrics and Gynecology

## 2017-03-29 VITALS — BP 106/52 | HR 64 | Ht 62.0 in | Wt 142.0 lb

## 2017-03-29 DIAGNOSIS — R6889 Other general symptoms and signs: Secondary | ICD-10-CM

## 2017-03-29 DIAGNOSIS — R102 Pelvic and perineal pain: Secondary | ICD-10-CM | POA: Diagnosis not present

## 2017-03-29 DIAGNOSIS — R198 Other specified symptoms and signs involving the digestive system and abdomen: Secondary | ICD-10-CM

## 2017-03-29 DIAGNOSIS — G8929 Other chronic pain: Secondary | ICD-10-CM

## 2017-03-29 DIAGNOSIS — N809 Endometriosis, unspecified: Secondary | ICD-10-CM

## 2017-03-29 NOTE — Progress Notes (Signed)
Obstetrics & Gynecology Office Visit   Chief Complaint:  Chief Complaint  Patient presents with  . N&V    Madison Dixon    History of Present Illness: 21 year old female with s/p laparoscopy 6 months ago showing endometriosis, subsequent Mirena IUD placement.  She presents for follow up for pelvic pain, nausea, and emesis.  She was evaluated by her PCP, normal white count, liver function, lipase, and CT scan in February (prior to IUD placement).  The patient reports symptoms for past 2 months, central pelvis but right slightly worse than left.  Sharp shooting pains.  She has achieved amenorrhea with IUD in place, has not returned for string check or checked strings herself.  No fevers, no chills.  No change in bowl movements, but reports nause and emesis.  Pain is unrelated to po intake.     Review of Systems: 10 point review of systems negative unless otherwise noted in HPI  Past Medical History:  Past Medical History:  Diagnosis Date  . Anxiety   . Asthma    sports induced  . Bipolar disorder (HCC)   . Chronic pelvic pain in female 09/09/2016  . Depression   . Endometriosis determined by laparoscopy 09/09/2016  . Ovarian cyst   . Pilonidal cyst   . PONV (postoperative nausea and vomiting)     Past Surgical History:  Past Surgical History:  Procedure Laterality Date  . COLONOSCOPY WITH ESOPHAGOGASTRODUODENOSCOPY (EGD)    . LAPAROSCOPY N/A 09/09/2016   Procedure: LAPAROSCOPY DIAGNOSTIC, FULGERATION OF ENDOMETRIOSIS;  Surgeon: Conard NovakStephen D Jackson, MD;  Location: [facility];  Service: Gynecology;  Laterality: N/A;  . PILONIDAL CYST EXCISION N/A 04/10/2015   Procedure: CYST EXCISION PILONIDAL EXTENSIVE;  Surgeon: Kieth BrightlySeeplaputhur G Sankar, MD;  Location: [facility];  Service: General;  Laterality: N/A;    Gynecologic History: No LMP recorded. Patient is not currently having periods (Reason: IUD).  Obstetric History: G0P0000  Family History:  Family History    Problem Relation Age of Onset  . Hyperlipidemia Father   . GER disease Father     Social History:  Social History   Social History  . Marital status: Single    Spouse name: N/A  . Number of children: N/A  . Years of education: N/A   Occupational History  . Not on file.   Social History Main Topics  . Smoking status: Never Smoker  . Smokeless tobacco: Never Used  . Alcohol use No  . Drug use: Yes    Types: Marijuana  . Sexual activity: Yes    Birth control/ protection: Implant   Other Topics Concern  . Not on file   Social History Narrative  . No narrative on file    Allergies:  Allergies  Allergen Reactions  . Vicodin [Hydrocodone-Acetaminophen] Nausea And Vomiting    Medications: Prior to Admission medications   Medication Sig Start Date End Date Taking? Authorizing Provider  fluticasone (FLONASE) 50 MCG/ACT nasal spray Place 2 sprays into both nostrils daily. 12/23/16  Yes Osvaldo AngstPollak, Adriana M, PA-C  lisdexamfetamine (VYVANSE) 20 MG capsule Take 20 mg by mouth daily.   Yes [provider]  lithium carbonate (ESKALITH) 450 MG CR tablet TAKE 1.5 (1&1/2) TABLET BY MOUTH AT BEDTIME 01/12/17  Yes [provider]  lurasidone (LATUDA) 20 MG TABS tablet Take 20 mg by mouth every evening.   Yes [provider]  propranolol ER (INDERAL LA) 80 MG 24 hr capsule Take by mouth daily. 01/26/17  Yes [provider]    Physical Exam Vitals:  Vitals:   03/29/17 1453  BP: (!) 106/52  Pulse: 64   No LMP recorded. Patient is not currently having periods (Reason: IUD).  General: NAD HEENT: normocephalic, anicteric Pulmonary: No increased work of breathing Abdomen: NABS, soft, reports diffuse BLQ tenderness, non-distended, no rebound, no guarding.  Umbilicus without lesions.  No hepatomegaly, splenomegaly or masses palpable. No evidence of hernia  Genitourinary:  External: Normal external female genitalia.  Normal urethral meatus, normal   Bartholin's and Skene's glands.    Vagina: Normal vaginal mucosa, no evidence of prolapse.    Cervix: Grossly normal in appearance, deviated to patient's left, no bleeding, no discharge, strings 2cm  Uterus: Non-enlarged, mobile, normal contour.  No CMT  Adnexa: there is fullness and tenderness noted in the right adnexa  Rectal: deferred  Lymphatic: no evidence of inguinal lymphadenopathy Extremities: no edema, erythema, or tenderness Neurologic: Grossly intact Psychiatric: mood appropriate, affect full  Female chaperone present for pelvic and breast  portions of the physical exam  Assessment: 21 y.o. G0P0000 endometriosis, subacute exacerbation of chronic pelvic pain  Plan: Problem List Items Addressed This Visit      Other   Chronic pelvic pain in female - Primary   Relevant Orders   CBC With Differential   US Transvaginal Non-OB    Other Visit Diagnoses    Endometriosis       Relevant Orders   CBC With Differential   US Transvaginal Non-OB   Right pelvic adnexal fluid collection       Relevant Orders   US Transvaginal Non-OB      - Uterus and cervix deviated to left, IUD strings visualized - Repeat CBC, also TVUS evalute right adnexa and IUD position.  Follow up with Dr. Jean Rosenthal after ultrasound given he knows her case well and did her initial surgery and IUD placement.  We discussed the importance of remaining with one provider for management of her symptoms.  I did briefly discuss addition of gabapentin if no acute lesion identified on ultrasound - A total of 15 minutes were spent in face-to-face contact with the patient during this encounter with over half of that time devoted to counseling and coordination of care.

## 2017-03-29 NOTE — Telephone Encounter (Signed)
Pt states AMS ran labs on her today. She is requesting a call when results return.

## 2017-03-30 ENCOUNTER — Encounter: Payer: Self-pay | Admitting: Obstetrics and Gynecology

## 2017-03-30 LAB — CBC WITH DIFFERENTIAL
BASOS ABS: 0 10*3/uL (ref 0.0–0.2)
Basos: 0 %
EOS (ABSOLUTE): 0.2 10*3/uL (ref 0.0–0.4)
EOS: 2 %
HEMATOCRIT: 39 % (ref 34.0–46.6)
Hemoglobin: 13.4 g/dL (ref 11.1–15.9)
IMMATURE GRANULOCYTES: 0 %
Immature Grans (Abs): 0 10*3/uL (ref 0.0–0.1)
Lymphocytes Absolute: 2.4 10*3/uL (ref 0.7–3.1)
Lymphs: 26 %
MCH: 30.6 pg (ref 26.6–33.0)
MCHC: 34.4 g/dL (ref 31.5–35.7)
MCV: 89 fL (ref 79–97)
MONOS ABS: 0.7 10*3/uL (ref 0.1–0.9)
Monocytes: 8 %
NEUTROS PCT: 64 %
Neutrophils Absolute: 5.9 10*3/uL (ref 1.4–7.0)
RBC: 4.38 x10E6/uL (ref 3.77–5.28)
RDW: 12.9 % (ref 12.3–15.4)
WBC: 9.3 10*3/uL (ref 3.4–10.8)

## 2017-04-01 ENCOUNTER — Ambulatory Visit (INDEPENDENT_AMBULATORY_CARE_PROVIDER_SITE_OTHER): Payer: Medicaid Other | Admitting: Family Medicine

## 2017-04-01 ENCOUNTER — Encounter: Payer: Self-pay | Admitting: Family Medicine

## 2017-04-01 VITALS — BP 102/68 | HR 52 | Temp 98.2°F | Resp 16 | Wt 141.0 lb

## 2017-04-01 DIAGNOSIS — R1084 Generalized abdominal pain: Secondary | ICD-10-CM | POA: Diagnosis not present

## 2017-04-01 DIAGNOSIS — R197 Diarrhea, unspecified: Secondary | ICD-10-CM

## 2017-04-01 DIAGNOSIS — R112 Nausea with vomiting, unspecified: Secondary | ICD-10-CM

## 2017-04-01 MED ORDER — ONDANSETRON 8 MG PO TBDP: 8.0000 mg | ORAL_TABLET | Freq: Three times a day (TID) | ORAL | 0 refills | Status: DC | PRN

## 2017-04-01 NOTE — Patient Instructions (Addendum)
Discussed use of Gatorade, eat as tolerated, and use imodium for diarrhea as needed. Do follow up with gyn for the ultrasound. We will call you with the lab results..Marland Kitchen

## 2017-04-01 NOTE — Progress Notes (Signed)
Subjective:     Patient ID: Madison Dixon, female   DOB: [birth year], 21 y.o.   MRN: [id number]  HPI  Chief Complaint  Patient presents with  . Abdominal Pain    lower abdomen. Feels swollen. She reports that she has a history of ovary cyst. She reports that she has been ahving the pain for 4-5 weeks and has been having diarrhea and vomiting for the last 2 weeks. She does not remember when her last period was because she has an IUD, She says no chance of being pregnant.   Was evaluated by gyn 6/5 (Nl CBC) and has TVUS ultrasound scheduled for 6/19. Hx of endometriosis and IUD. Prior GI workup with panendoscopy remarkable for solitary polyp. Not sure when she [facility] though seh states dose was increased. Feels moods are stable. Weight is increased by one lb. Since March o.v.   Review of Systems     Objective:   Physical Exam  Constitutional: She appears well-developed and well-nourished. No distress.  Abdominal: Soft. There is tenderness (mild diffuse).       Assessment:    1. Non-intractable vomiting with nausea, unspecified vomiting type - Lithium level - ondansetron (ZOFRAN ODT) 8 MG disintegrating tablet; Take 1 tablet (8 mg total) by mouth every 8 (eight) hours as needed for nausea or vomiting.  Dispense: 21 tablet; Refill: 0 - Comprehensive metabolic panel  2. Diarrhea, unspecified type - Lithium level  3. Generalized abdominal pain - Lithium level    Plan:    Discussed use of imodium, Gatorade, and eating as tolerated pending ultrasound. May call Saturday for lab results.

## 2017-04-12 ENCOUNTER — Other Ambulatory Visit: Payer: Medicaid Other

## 2017-04-12 ENCOUNTER — Ambulatory Visit: Payer: Medicaid Other | Admitting: Obstetrics and Gynecology

## 2017-04-13 ENCOUNTER — Encounter: Payer: Self-pay | Admitting: Physician Assistant

## 2017-04-13 ENCOUNTER — Other Ambulatory Visit: Payer: Medicaid Other

## 2017-04-13 ENCOUNTER — Ambulatory Visit (INDEPENDENT_AMBULATORY_CARE_PROVIDER_SITE_OTHER): Payer: Medicaid Other | Admitting: Physician Assistant

## 2017-04-13 ENCOUNTER — Ambulatory Visit: Payer: Medicaid Other | Admitting: Obstetrics and Gynecology

## 2017-04-13 VITALS — BP 100/60 | HR 80 | Temp 98.1°F | Resp 16 | Ht 62.0 in | Wt 145.8 lb

## 2017-04-13 DIAGNOSIS — A084 Viral intestinal infection, unspecified: Secondary | ICD-10-CM

## 2017-04-13 NOTE — Progress Notes (Signed)
Patient: Madison Dixon Female    DOB: 1996-09-30   21 y.o.   MRN: [id number] Visit Date: 04/13/2017  Today's Provider: Margaretann Loveless, PA-C   Chief Complaint  Patient presents with  . Abdominal Pain   Subjective:    HPI Patient here today C/O abdominal pain, vomiting and diarrhea since Sunday. Patient report that today she is feeling better. Patient denies any fever and reports eating and drinking well. She reports that it is similar to previous episodes but on Sunday it was the worst it has ever been. She does not report being around any sick contacts. She does not know if she had a fever or not.    Allergies  Allergen Reactions  . Vicodin [Hydrocodone-Acetaminophen] Nausea And Vomiting     Current Outpatient Prescriptions:  .  fluticasone (FLONASE) 50 MCG/ACT nasal spray, Place 2 sprays into both nostrils daily., Disp: 16 g, Rfl: 6 .  lisdexamfetamine (VYVANSE) 20 MG capsule, Take 20 mg by mouth daily., Disp: , Rfl:  .  lithium carbonate (ESKALITH) 450 MG CR tablet, TAKE 1.5 (1&1/2) TABLET BY MOUTH AT BEDTIME, Disp: , Rfl: 2 .  lurasidone (LATUDA) 20 MG TABS tablet, Take 20 mg by mouth every evening., Disp: , Rfl:  .  ondansetron (ZOFRAN ODT) 8 MG disintegrating tablet, Take 1 tablet (8 mg total) by mouth every 8 (eight) hours as needed for nausea or vomiting., Disp: 21 tablet, Rfl: 0 .  propranolol ER (INDERAL LA) 80 MG 24 hr capsule, Take by mouth daily., Disp: , Rfl: 0  Review of Systems  Constitutional: Negative.   Respiratory: Negative.   Cardiovascular: Negative.   Gastrointestinal: Positive for abdominal pain.  Neurological: Negative.   Psychiatric/Behavioral: Negative.     Social History  Substance Use Topics  . Smoking status: Never Smoker  . Smokeless tobacco: Never Used  . Alcohol use No   Objective:   BP 100/60 (BP Location: Left Arm, Patient Position: Sitting, Cuff Size: Large)   Pulse 80   Temp 98.1 F (36.7 C)   Resp 16   Ht 5\' 2"   (1.575 m)   Wt 145 lb 12.8 oz (66.1 kg)   SpO2 98%   BMI 26.67 kg/m  Vitals:   04/13/17 1618  BP: 100/60  Pulse: 80  Resp: 16  Temp: 98.1 F (36.7 C)  SpO2: 98%  Weight: 145 lb 12.8 oz (66.1 kg)  Height: 5\' 2"  (1.575 m)     Physical Exam  Constitutional: She is oriented to person, place, and time. She appears well-developed and well-nourished. No distress.  Cardiovascular: Normal rate, regular rhythm and normal heart sounds.  Exam reveals no gallop and no friction rub.   No murmur heard. Pulmonary/Chest: Effort normal and breath sounds normal. No respiratory distress. She has no wheezes. She has no rales.  Abdominal: Soft. Normal appearance and bowel sounds are normal. She exhibits no distension and no mass. There is no hepatosplenomegaly. There is generalized tenderness. There is no rebound, no guarding and no CVA tenderness.  Neurological: She is alert and oriented to person, place, and time.  Skin: Skin is warm and dry. She is not diaphoretic.  Vitals reviewed.     Assessment & Plan:     1. Viral gastroenteritis Patient reports symptoms are now improving. No diarrhea or vomiting today. Push fluids. Patient requires work note because she missed work Sunday through today. She feels well enough to return today. This was given to her today.  She is to call if symptoms worsen again.       Margaretann LovelessJennifer M Melyssa Signor, PA-C  [facility]

## 2017-04-13 NOTE — Patient Instructions (Addendum)
Bland Diet °A bland diet consists of foods that do not have a lot of fat or fiber. Foods without fat or fiber are easier for the body to digest. They are also less likely to irritate your mouth, throat, stomach, and other parts of your gastrointestinal tract. A bland diet is sometimes called a BRAT diet. °What is my plan? °Your health care provider or dietitian may recommend specific changes to your diet to prevent and treat your symptoms, such as: °· Eating small meals often. °· Cooking food until it is soft enough to chew easily. °· Chewing your food well. °· Drinking fluids slowly. °· Not eating foods that are very spicy, sour, or fatty. °· Not eating citrus fruits, such as oranges and grapefruit. ° °What do I need to know about this diet? °· Eat a variety of foods from the bland diet food list. °· Do not follow a bland diet longer than you have to. °· Ask your health care provider whether you should take vitamins. °What foods can I eat? °Grains ° °Hot cereals, such as cream of wheat. Bread, crackers, or tortillas made from refined white flour. Rice. °Vegetables °Canned or cooked vegetables. Mashed or boiled potatoes. °Fruits °Bananas. Applesauce. Other types of cooked or canned fruit with the skin and seeds removed, such as canned peaches or pears. °Meats and Other Protein Sources °Scrambled eggs. Creamy peanut butter or other nut butters. Lean, well-cooked meats, such as chicken or fish. Tofu. Soups or broths. °Dairy °Low-fat dairy products, such as milk, cottage cheese, or yogurt. °Beverages °Water. Herbal tea. Apple juice. °Sweets and Desserts °Pudding. Custard. Fruit gelatin. Ice cream. °Fats and Oils °Mild salad dressings. Canola or olive oil. °The items listed above may not be a complete list of allowed foods or beverages. Contact your dietitian for more options. °What foods are not recommended? °Foods and ingredients that are often not recommended include: °· Spicy foods, such as hot sauce or  salsa. °· Fried foods. °· Sour foods, such as pickled or fermented foods. °· Raw vegetables or fruits, especially citrus or berries. °· Caffeinated drinks. °· Alcohol. °· Strongly flavored seasonings or condiments. ° °The items listed above may not be a complete list of foods and beverages that are not allowed. Contact your dietitian for more information. °This information is not intended to replace advice given to you by your health care provider. Make sure you discuss any questions you have with your health care provider. °Document Released: 02/02/2016 Document Revised: 03/18/2016 Document Reviewed: 10/23/2014 °Elsevier Interactive Patient Education © 2018 [employer] °Viral Gastroenteritis, Adult °Viral gastroenteritis is also known as the stomach flu. This condition is caused by various viruses. These viruses can be passed from person to person very easily (are very contagious). This condition may affect your stomach, small intestine, and large intestine. It can cause sudden watery diarrhea, fever, and vomiting. °Diarrhea and vomiting can make you feel weak and cause you to become dehydrated. You may not be able to keep fluids down. Dehydration can make you tired and thirsty, cause you to have a dry mouth, and decrease how often you urinate. Older adults and people with other diseases or a weak immune system are at higher risk for dehydration. °It is important to replace the fluids that you lose from diarrhea and vomiting. If you become severely dehydrated, you may need to get fluids through an IV tube. °What are the causes? °Gastroenteritis is caused by various viruses, including rotavirus and norovirus. Norovirus is the most common   cause in adults. °You can get sick by eating food, drinking water, or touching a surface contaminated with one of these viruses. You can also get sick from sharing utensils or other personal items with an infected person. °What increases the risk? °This condition is more likely to  develop in people: °· Who have a weak defense system (immune system). °· Who live with one or more children who are younger than 2 years old. °· Who live in a nursing home. °· Who go on cruise ships. ° °What are the signs or symptoms? °Symptoms of this condition start suddenly 1-2 days after exposure to a virus. Symptoms may last a few days or as long as a week. The most common symptoms are watery diarrhea and vomiting. Other symptoms include: °· Fever. °· Headache. °· Fatigue. °· Pain in the abdomen. °· Chills. °· Weakness. °· Nausea. °· Muscle aches. °· Loss of appetite. ° °How is this diagnosed? °This condition is diagnosed with a medical history and physical exam. You may also have a stool test to check for viruses or other infections. °How is this treated? °This condition typically goes away on its own. The focus of treatment is to restore lost fluids (rehydration). Your health care provider may recommend that you take an oral rehydration solution (ORS) to replace important salts and minerals (electrolytes) in your body. Severe cases of this condition may require giving fluids through an IV tube. °Treatment may also include medicine to help with your symptoms. °Follow these instructions at home: °Follow instructions from your health care provider about how to care for yourself at home. °Eating and drinking °Follow these recommendations as told by your health care provider: °· Take an ORS. This is a drink that is sold at pharmacies and retail stores. °· Drink clear fluids in small amounts as you are able. Clear fluids include water, ice chips, diluted fruit juice, and low-calorie sports drinks. °· Eat bland, easy-to-digest foods in small amounts as you are able. These foods include bananas, applesauce, rice, lean meats, toast, and crackers. °· Avoid fluids that contain a lot of sugar or caffeine, such as energy drinks, sports drinks, and soda. °· Avoid alcohol. °· Avoid spicy or fatty foods. ° °General  instructions ° °· Drink enough fluid to keep your urine clear or pale yellow. °· Wash your hands often. If soap and water are not available, use hand sanitizer. °· Make sure that all people in your household wash their hands well and often. °· Take over-the-counter and prescription medicines only as told by your health care provider. °· Rest at home while you recover. °· Watch your condition for any changes. °· Take a warm bath to relieve any burning or pain from frequent diarrhea episodes. °· Keep all follow-up visits as told by your health care provider. This is important. °Contact a health care provider if: °· You cannot keep fluids down. °· Your symptoms get worse. °· You have new symptoms. °· You feel light-headed or dizzy. °· You have muscle cramps. °Get help right away if: °· You have chest pain. °· You feel extremely weak or you faint. °· You see blood in your vomit. °· Your vomit looks like coffee grounds. °· You have bloody or black stools or stools that look like tar. °· You have a severe headache, a stiff neck, or both. °· You have a rash. °· You have severe pain, cramping, or bloating in your abdomen. °· You have trouble breathing or you are breathing very   quickly. °· Your heart is beating very quickly. °· Your skin feels cold and clammy. °· You feel confused. °· You have pain when you urinate. °· You have signs of dehydration, such as: °? Dark urine, very little urine, or no urine. °? Cracked lips. °? Dry mouth. °? Sunken eyes. °? Sleepiness. °? Weakness. °This information is not intended to replace advice given to you by your health care provider. Make sure you discuss any questions you have with your health care provider. °Document Released: 10/11/2005 Document Revised: 03/24/2016 Document Reviewed: 06/17/2015 °Elsevier Interactive Patient Education © 2017 [employer] ° °

## 2017-04-20 IMAGING — US US TRANSVAGINAL NON-OB
1 series · 14 of 25 positions shown · non-contrast
Comparison: CT abdomen and pelvis October 13, 2015

CLINICAL DATA: Right lower quadrant and pelvic pain for 2 months

EXAM:
TRANSABDOMINAL AND TRANSVAGINAL ULTRASOUND OF PELVIS
TECHNIQUE: Study was performed transabdominally to optimize pelvic field of
view evaluation and transvaginally to optimize internal visceral
architecture evaluation.

[Series 1: us transvaginal non-ob · 0.22mm/px · 14 of 85 slices shown]
[im 1/85]
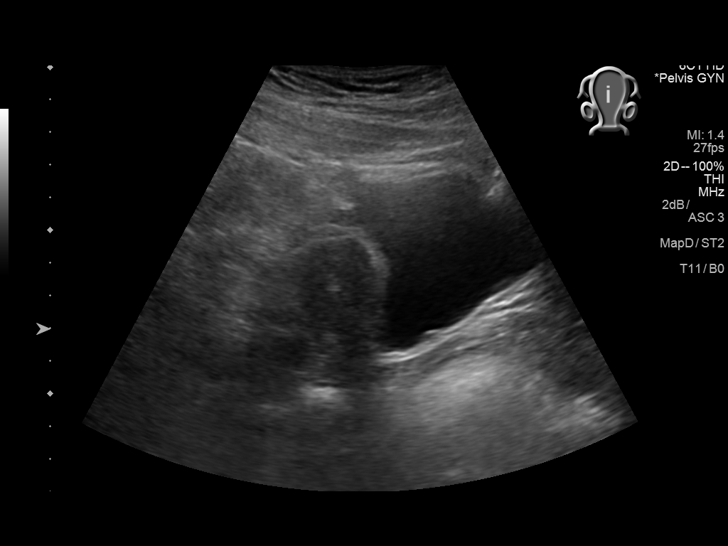
[im 8/85]
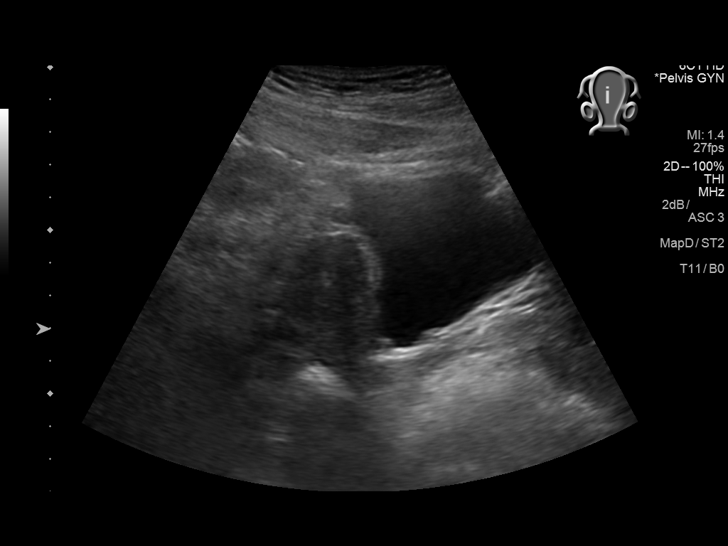
[im 15/85]
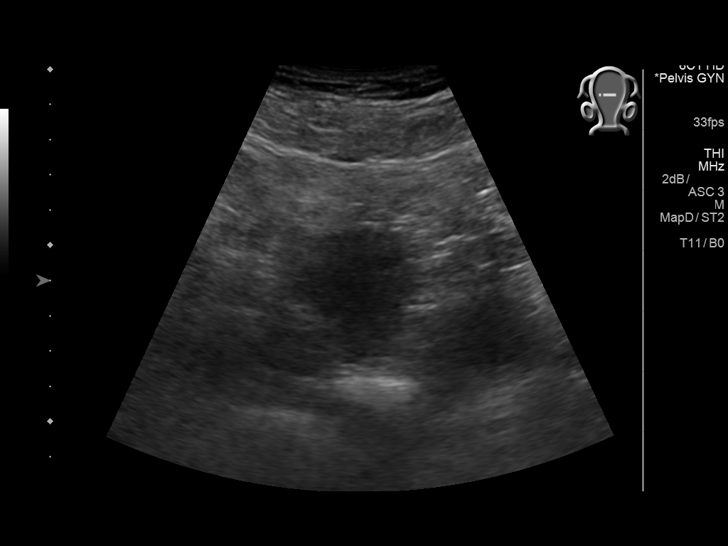
[im 22/85]
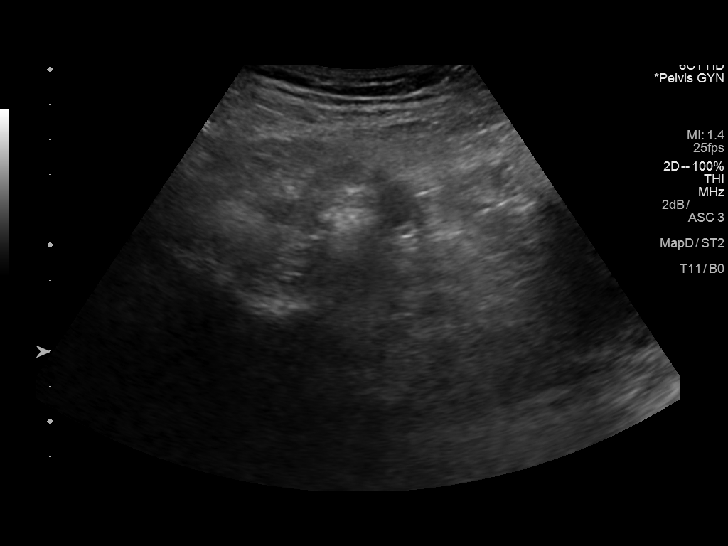
[im 29/85]
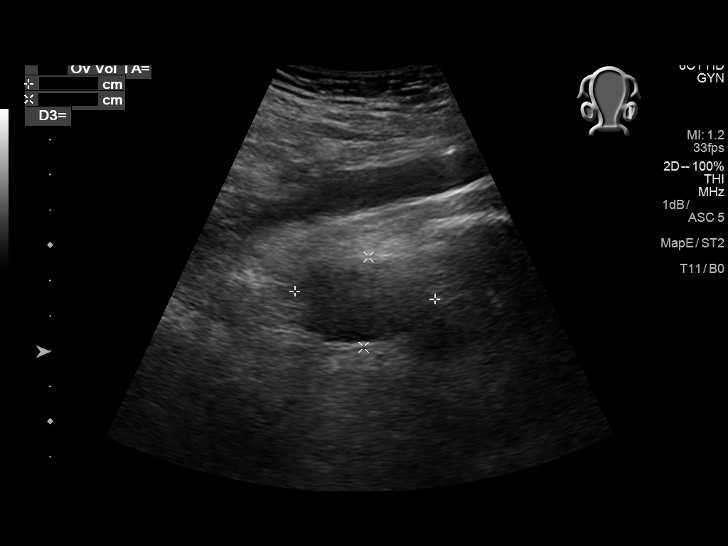
[im 32/85]
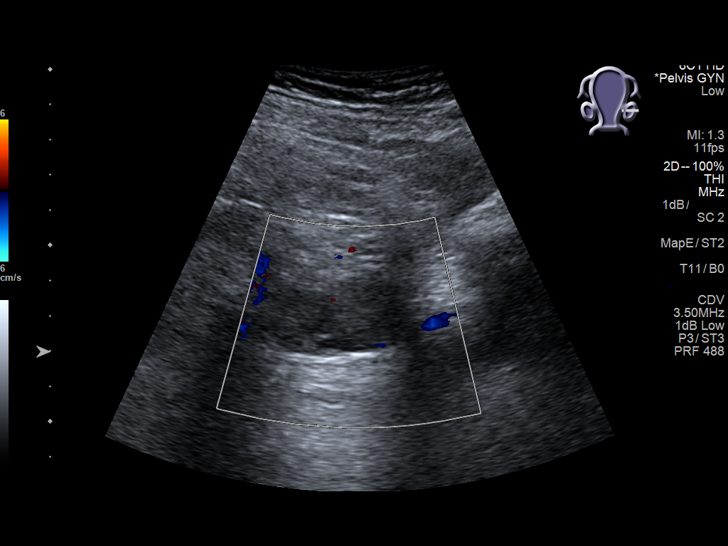
[im 39/85]
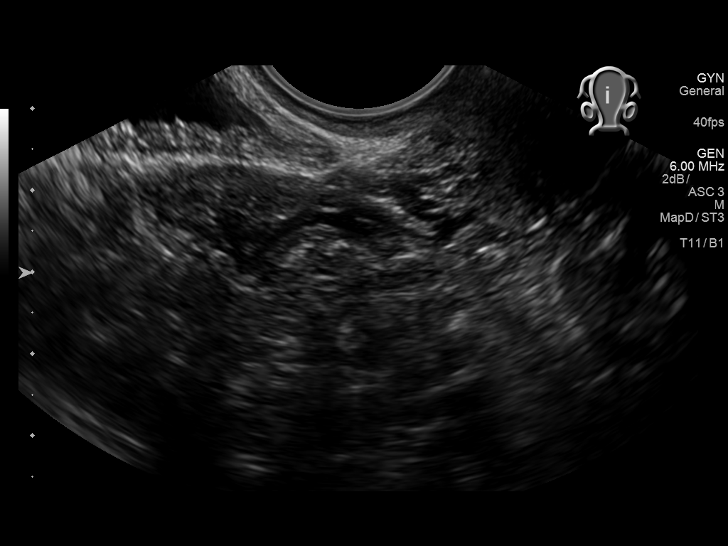
[im 46/85]
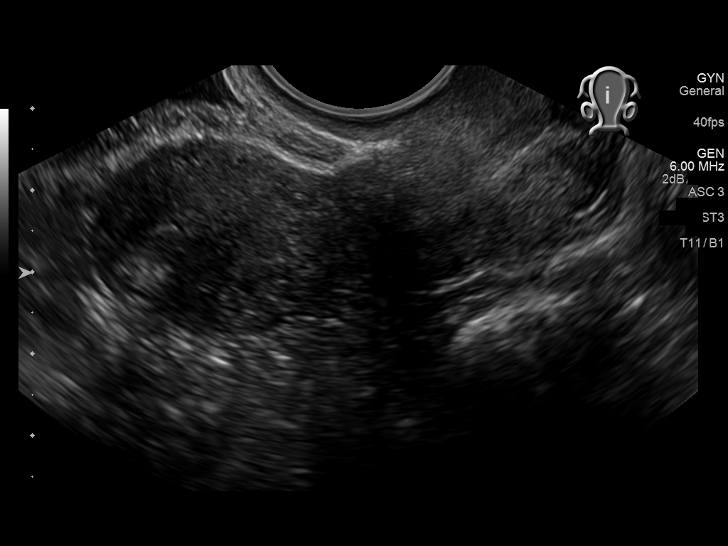
[im 53/85]
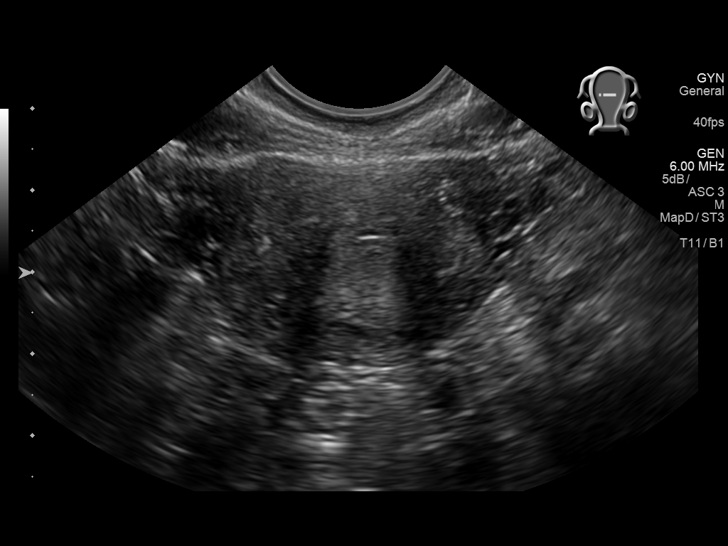
[im 57/85]
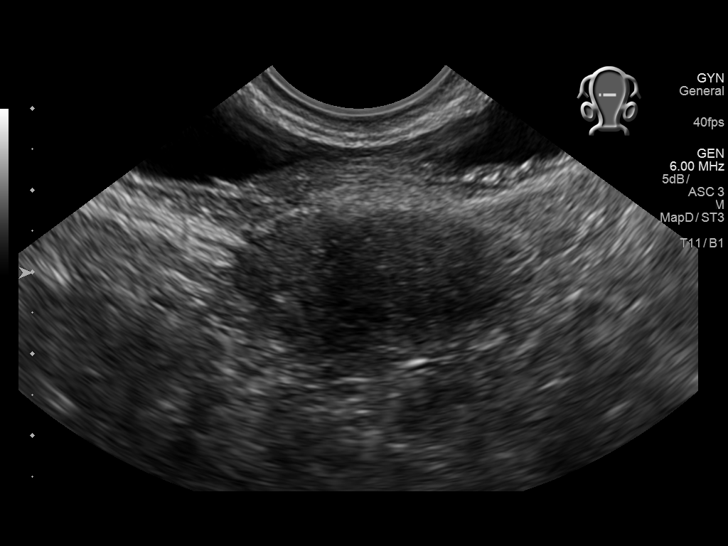
[im 64/85]
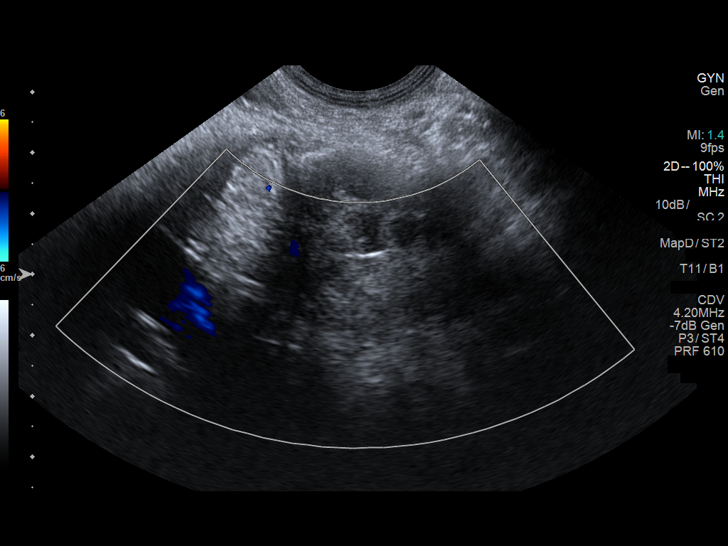
[im 71/85]
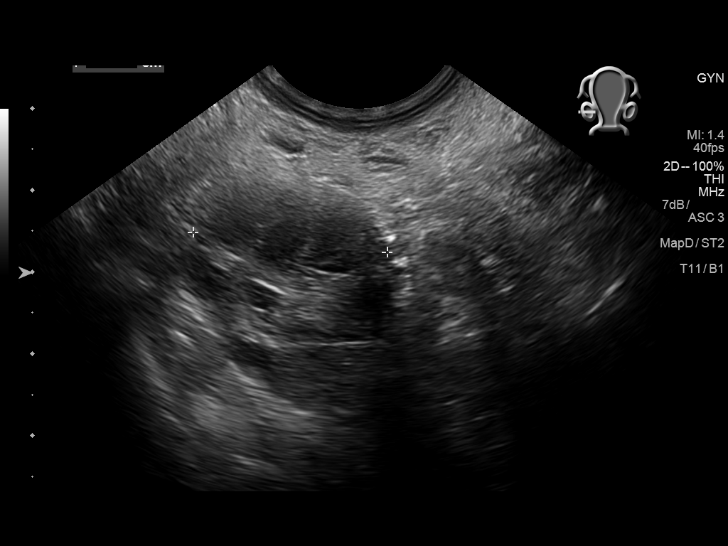
[im 78/85]
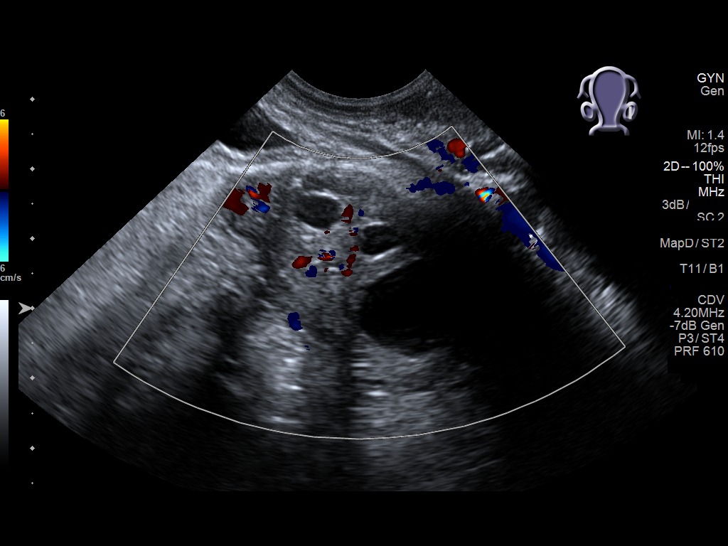
[im 85/85]
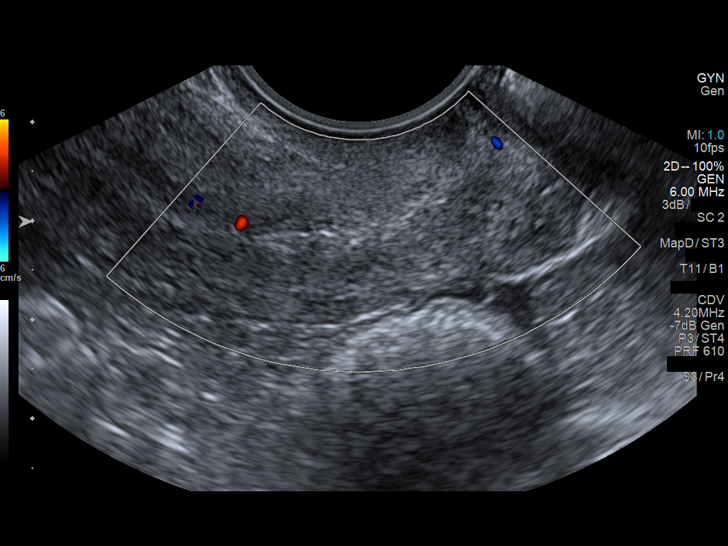

[14 of 25 positions shown; findings below may reference images not displayed]

FINDINGS: Uterus

Measurements: 6.5 x 2.8 x 4.0 cm. No fibroids or other mass
visualized. Uterus is anteverted.

Endometrium

Thickness: 4 mm.  No focal abnormality visualized.

Right ovary

Measurements: 4.0 x 2.4 x 2.4 cm. Normal appearance/no adnexal mass.

Left ovary

Measurements: 3.3 x 2.4 x 2.6 cm. Normal appearance/no adnexal mass.

Other findings

No abnormal free fluid.
IMPRESSION: Study within normal limits.

## 2017-04-28 ENCOUNTER — Ambulatory Visit (INDEPENDENT_AMBULATORY_CARE_PROVIDER_SITE_OTHER): Payer: Medicaid Other

## 2017-04-28 ENCOUNTER — Encounter: Payer: Self-pay | Admitting: Obstetrics and Gynecology

## 2017-04-28 ENCOUNTER — Ambulatory Visit (INDEPENDENT_AMBULATORY_CARE_PROVIDER_SITE_OTHER): Payer: Medicaid Other | Admitting: Obstetrics and Gynecology

## 2017-04-28 VITALS — BP 120/80 | HR 53 | Ht 62.0 in | Wt 139.0 lb

## 2017-04-28 DIAGNOSIS — G8929 Other chronic pain: Secondary | ICD-10-CM

## 2017-04-28 DIAGNOSIS — N809 Endometriosis, unspecified: Secondary | ICD-10-CM

## 2017-04-28 DIAGNOSIS — R6889 Other general symptoms and signs: Secondary | ICD-10-CM | POA: Diagnosis not present

## 2017-04-28 DIAGNOSIS — R102 Pelvic and perineal pain: Secondary | ICD-10-CM | POA: Diagnosis not present

## 2017-04-28 NOTE — Progress Notes (Signed)
Gynecology Ultrasound Follow Up   Chief Complaint  Patient presents with  . Follow-up  Pelvic pain, IUD location   History of Present Illness: Patient is a 21 y.o. female who presents today for ultrasound evaluation of pelvic pain and IUD location .  Ultrasound demonstrates the following findings Adnexa: no masses seen  Uterus: anteverted with endometrial stripe  1.8 mm Additional: no other issues seen, IUD in correct location.  She states that she has recurrent ovarian cysts and always has had them.  She has had multiple treatments for her endometriosis.  This includes combined oral contraceptives (lo lo estrin fe), norethindrone, and now the IUD.  She requests hysterectomy and states that she is will to have a surrogate for childbearing.  Her pain is midline, but right worse than left. It is sharp and stabbing. It is constant and severe.  She has recently been evaluated for gastroenteritis.  She believes all her pain is from endometriosis.   Review of Systems: Review of Systems  Constitutional: Negative.   HENT: Negative.   Eyes: Negative.   Respiratory: Negative.   Cardiovascular: Negative.   Gastrointestinal: Positive for abdominal pain (per HPI). Negative for blood in stool, constipation, diarrhea, heartburn, nausea and vomiting.  Genitourinary:       Per HPI  Musculoskeletal: Negative.   Skin: Negative.   Neurological: Negative.   Psychiatric/Behavioral: Negative.    Past Medical History:  Diagnosis Date  . Anxiety   . Asthma    sports induced  . Bipolar disorder (HCC)   . Chronic pelvic pain in female 09/09/2016  . Depression   . Endometriosis determined by laparoscopy 09/09/2016  . Ovarian cyst   . Pilonidal cyst   . PONV (postoperative nausea and vomiting)    Past Surgical History:  Procedure Laterality Date  . COLONOSCOPY WITH ESOPHAGOGASTRODUODENOSCOPY (EGD)    . LAPAROSCOPY N/A 09/09/2016   Procedure: LAPAROSCOPY DIAGNOSTIC, FULGERATION OF  ENDOMETRIOSIS;  Surgeon: Conard Novak, MD;  Location: [facility];  Service: Gynecology;  Laterality: N/A;  . PILONIDAL CYST EXCISION N/A 04/10/2015   Procedure: CYST EXCISION PILONIDAL EXTENSIVE;  Surgeon: Kieth Brightly, MD;  Location: [facility];  Service: General;  Laterality: N/A;   Family History  Problem Relation Age of Onset  . Hyperlipidemia Father   . GER disease Father    Social History   Social History  . Marital status: Single    Spouse name: N/A  . Number of children: N/A  . Years of education: N/A   Occupational History  . Not on file.   Social History Main Topics  . Smoking status: Never Smoker  . Smokeless tobacco: Never Used  . Alcohol use No  . Drug use: Yes    Types: Marijuana  . Sexual activity: Yes    Birth control/ protection: IUD   Other Topics Concern  . Not on file   Social History Narrative  . No narrative on file   Allergies  Allergen Reactions  . Vicodin [Hydrocodone-Acetaminophen] Nausea And Vomiting    Medications:   Medication Sig Start Date End Date Taking? Authorizing Provider  fluticasone (FLONASE) 50 MCG/ACT nasal spray Place 2 sprays into both nostrils daily. 12/23/16   Trey Sailors, PA-C  lisdexamfetamine (VYVANSE) 20 MG capsule Take 20 mg by mouth daily.    [provider]  lithium carbonate (ESKALITH) 450 MG CR tablet TAKE 1.5 (1&1/2) TABLET BY MOUTH AT BEDTIME 01/12/17   [provider]  lurasidone (LATUDA) 20 MG TABS  tablet Take 20 mg by mouth every evening.    [provider]  ondansetron (ZOFRAN ODT) 8 MG disintegrating tablet Take 1 tablet (8 mg total) by mouth every 8 (eight) hours as needed for nausea or vomiting. 04/01/17   Anola Gurneyhauvin, Robert, PA  propranolol ER (INDERAL LA) 80 MG 24 hr capsule Take by mouth daily. 01/26/17   [provider]    Physical Exam Vitals: Blood pressure 120/80, pulse (!) 53, height 5\' 2"  (1.575 m), weight 139 lb (63 kg).  General: NAD, though tearful  at times HEENT: normocephalic, anicteric Pulmonary: No increased work of breathing Extremities: no edema, erythema, or tenderness Neurologic: Grossly intact, normal gait Psychiatric: mood appropriate, affect full   Assessment: 20 y.o. G0P0000 No problem-specific Assessment & Plan notes found for this encounter.   Plan: Problem List Items Addressed This Visit    Chronic pelvic pain in female   Relevant Medications   leuprolide (LUPRON DEPOT, 39-MONTH,) 11.25 MG injection   norethindrone (AYGESTIN) 5 MG tablet   Other Relevant Orders   Ambulatory referral to Gynecology   Endometriosis determined by laparoscopy - Primary   Relevant Medications   leuprolide (LUPRON DEPOT, 39-MONTH,) 11.25 MG injection   norethindrone (AYGESTIN) 5 MG tablet   Other Relevant Orders   Ambulatory referral to      I spent about 25 minutes in direct, face-to-face discussion with this patient about her pelvic pain. She has tried a number of therapies for her pain.  She is at the point of wanting a hysterectomy. I am extremely reluctant to offer this surgery to her as we have not been through all the possible treatment options for endometriosis.  Additionally, while she has had demonstrated endometriosis on laparoscopy, her pain source could be something else and a surgery to remove her uterus might not be curative for her.  I discussed that there are many other treatment modalities available, including depo lupron, SNRI's, possibly gabapentin, etc.  I recommended a second opinion for a dedicated pelvic pain clinic.  She has agreed to this. I [facility].  I discussed attempting depo leuprolide with her today and she is willing to try this. She states that she does not want to be strung along trying different treatments. She states that her mother did this and ultimately had a hysterectomy. She states that she would like to just get it over with.  We discussed this at length.  I much  prefer to get a second opinion. In the mean time she did agree to depo lupron and I will order this with add-back therapy with norethindrone 5mg  po daily.    I will see her back in 3 months to assess response to Depo Lupron.  [facility].  Rx for Depo Lupron and norethindrone sent in.   Thomasene MohairStephen Tylah Mancillas, MD  04/28/2017 5:25 PM

## 2017-04-29 MED ORDER — LEUPROLIDE ACETATE (3 MONTH) 11.25 MG IM KIT: 11.2500 mg | PACK | INTRAMUSCULAR | 3 refills | Status: DC

## 2017-04-29 MED ORDER — LEVONORGESTREL 20 MCG/24HR IU IUD: 1.0000 | INTRAUTERINE_SYSTEM | Freq: Once | INTRAUTERINE | 0 refills | Status: DC

## 2017-04-29 MED ORDER — NORETHINDRONE ACETATE 5 MG PO TABS: 5.0000 mg | ORAL_TABLET | Freq: Every day | ORAL | 11 refills | Status: DC

## 2017-05-05 ENCOUNTER — Telehealth: Payer: Self-pay

## 2017-05-05 NOTE — Telephone Encounter (Signed)
Pt called triage line inquiring about the Lupron she is about to start. She has questions about that medication and if it can affect fertility. Also, has other questions about medication.

## 2017-05-06 NOTE — Telephone Encounter (Signed)
Pt is returning call. Please call patient °

## 2017-05-06 NOTE — Telephone Encounter (Signed)
Left generic vm 

## 2017-05-09 NOTE — Telephone Encounter (Signed)
Pt returning call from Dr. Jean RosenthalJackson and request call back. CB#[phone]. Thanks TNP

## 2017-05-09 NOTE — Telephone Encounter (Signed)
Pt had a number or questions about leuprolide/norethindrone. All answered. [facility]

## 2017-05-11 NOTE — Telephone Encounter (Signed)
Pt is schedule at 11 on 05/12/17 for injection

## 2017-05-11 NOTE — Telephone Encounter (Signed)
Pt's Depo Lupron Injection has arrived. Please contact pt to schedule nurse visit for injection at her convenience.

## 2017-05-12 ENCOUNTER — Ambulatory Visit (INDEPENDENT_AMBULATORY_CARE_PROVIDER_SITE_OTHER): Payer: Medicaid Other

## 2017-05-12 DIAGNOSIS — N809 Endometriosis, unspecified: Secondary | ICD-10-CM

## 2017-05-12 MED ORDER — LEUPROLIDE ACETATE (3 MONTH) 11.25 MG IM KIT
11.2500 mg | PACK | Freq: Once | INTRAMUSCULAR | Status: AC
  Administered 2017-05-12: 11.25 mg via INTRAMUSCULAR

## 2017-05-12 MED ORDER — LEUPROLIDE ACETATE (3 MONTH) 11.25 MG IM KIT: 11.2500 mg | PACK | INTRAMUSCULAR | 0 refills | Status: DC

## 2017-05-12 NOTE — Progress Notes (Signed)
Pt here for depo lupron inj which was given IM right glut.  NDC# [phone]

## 2017-08-02 ENCOUNTER — Telehealth: Payer: Self-pay | Admitting: Obstetrics and Gynecology

## 2017-08-02 NOTE — Telephone Encounter (Signed)
-----   Message from [place], LPN sent at 69/03/2951  2:24 PM EDT ----- Regarding: Lupron Depot & F/U Pt seen by SDJ 04/28/17 & was to F/U in 3 mos from starting Lupron Depot. Pt received 1st Lupron Depot 05/12/17. 2nd Lupron Depot has arrived. Please schedule f/u w/SDJ & Lupron Injection same day on or after 08/12/17.

## 2017-08-02 NOTE — Telephone Encounter (Signed)
Attempted to reach patient to schedule follow up with SDJ. Phone number has been changed or no longer in use.

## 2017-08-03 NOTE — Telephone Encounter (Signed)
Pt is schedule 08/15/17 with SDJ.

## 2017-08-15 ENCOUNTER — Ambulatory Visit (INDEPENDENT_AMBULATORY_CARE_PROVIDER_SITE_OTHER): Payer: Medicaid Other | Admitting: Obstetrics and Gynecology

## 2017-08-15 VITALS — BP 122/70 | Ht 62.0 in | Wt 146.0 lb

## 2017-08-15 DIAGNOSIS — N809 Endometriosis, unspecified: Secondary | ICD-10-CM

## 2017-08-15 MED ORDER — LEUPROLIDE ACETATE (3 MONTH) 11.25 MG IM KIT
11.2500 mg | PACK | Freq: Once | INTRAMUSCULAR | Status: AC
  Administered 2017-08-15: 11.25 mg via INTRAMUSCULAR

## 2017-08-15 NOTE — Progress Notes (Signed)
Obstetrics & Gynecology Office Visit   Chief Complaint  Patient presents with  . Follow-up    History of Present Illness: 21 y.o. G0P0000 female who presents for surveillance of the use of depo leuprolide for the past three months. In the interim she has [facility], who agreed with current management and discussed possible longer-term treatment with GnRH antagonist.  She states she is doing much better with her current treatment. She is also here today for her second dose of Lupron.  She is taking add-back therapy with norethindrone and denies hot flashes or any side effects.   Past Medical History:  Diagnosis Date  . Anxiety   . Asthma    sports induced  . Bipolar disorder (HCC)   . Chronic pelvic pain in female 09/09/2016  . Depression   . Endometriosis determined by laparoscopy 09/09/2016  . Ovarian cyst   . Pilonidal cyst   . PONV (postoperative nausea and vomiting)     Past Surgical History:  Procedure Laterality Date  . COLONOSCOPY WITH ESOPHAGOGASTRODUODENOSCOPY (EGD)    . LAPAROSCOPY N/A 09/09/2016   Procedure: LAPAROSCOPY DIAGNOSTIC, FULGERATION OF ENDOMETRIOSIS;  Surgeon: Conard NovakStephen D Kellan Boehlke, MD;  Location: [facility];  Service: Gynecology;  Laterality: N/A;  . PILONIDAL CYST EXCISION N/A 04/10/2015   Procedure: CYST EXCISION PILONIDAL EXTENSIVE;  Surgeon: Kieth BrightlySeeplaputhur G Sankar, MD;  Location: [facility];  Service: General;  Laterality: N/A;    Gynecologic History: No LMP recorded. Patient is not currently having periods (Reason: IUD).  Obstetric History: G0P0000  Family History  Problem Relation Age of Onset  . Hyperlipidemia Father   . GER disease Father     Social History   Social History  . Marital status: Single    Spouse name: N/A  . Number of children: N/A  . Years of education: N/A   Occupational History  . Not on file.   Social History Main Topics  . Smoking status: Never Smoker  . Smokeless tobacco: Never Used  . Alcohol use  No  . Drug use: Yes    Types: Marijuana  . Sexual activity: Yes    Birth control/ protection: IUD   Other Topics Concern  . Not on file   Social History Narrative  . No narrative on file    Allergies  Allergen Reactions  . Vicodin [Hydrocodone-Acetaminophen] Nausea And Vomiting    Prior to Admission medications   Medication Sig Start Date End Date Taking? Authorizing Provider  leuprolide (LUPRON DEPOT, 86-MONTH,) 11.25 MG injection Inject 11.25 mg into the muscle every 3 (three) months. 04/29/17  Yes Conard NovakJackson, Sanyla Summey D, MD  lisdexamfetamine (VYVANSE) 20 MG capsule Take 20 mg by mouth daily.   Yes [provider]  lurasidone (LATUDA) 20 MG TABS tablet Take 20 mg by mouth every evening.   Yes [provider]  ondansetron (ZOFRAN ODT) 8 MG disintegrating tablet Take 1 tablet (8 mg total) by mouth every 8 (eight) hours as needed for nausea or vomiting. 04/01/17  Yes Anola Gurneyhauvin, Robert, PA  propranolol ER (INDERAL LA) 80 MG 24 hr capsule Take by mouth daily. 01/26/17  Yes [provider]  levonorgestrel (MIRENA) 20 MCG/24HR IUD 1 Intra Uterine Device (1 each total) by Intrauterine route once. 12/13/16 12/13/16  Conard NovakJackson, Kiah Keay D, MD  lithium carbonate (ESKALITH) 450 MG CR tablet TAKE 1.5 (1&1/2) TABLET BY MOUTH AT BEDTIME 01/12/17   [provider]  norethindrone (AYGESTIN) 5 MG tablet Take 1 tablet (5 mg total) by mouth  daily. Patient not taking: Reported on 08/15/2017 04/29/17   Conard Novak, MD    Review of Systems  Constitutional: Negative.   HENT: Negative.   Eyes: Negative.   Respiratory: Negative.   Cardiovascular: Negative.   Gastrointestinal: Negative.   Genitourinary: Negative.   Musculoskeletal: Negative.   Skin: Negative.   Neurological: Negative.   Psychiatric/Behavioral: Negative.      Physical Exam BP 122/70   Ht 5\' 2"  (1.575 m)   Wt 146 lb (66.2 kg)   BMI 26.70 kg/m  No LMP recorded. Patient is not currently having periods  (Reason: IUD). Physical Exam  Constitutional: She is oriented to person, place, and time. She appears well-developed and well-nourished. No distress.  HENT:  Head: Normocephalic and atraumatic.  Neurological: She is alert and oriented to person, place, and time. No cranial nerve deficit.  Psychiatric: She has a normal mood and affect. Her behavior is normal. Judgment normal.    Assessment: 21 y.o. G0P0000 female here for  1. Endometriosis determined by laparoscopy      Plan: Problem List Items Addressed This Visit    Endometriosis determined by laparoscopy - Primary     Continue current medication and therapy. Receiving benefit and has no side effects.   15 minutes spent in face to face discussion with > 50% spent in counseling and management of her endometriosis.   Thomasene Mohair, MD 08/15/2017 2:54 PM

## 2017-08-29 ENCOUNTER — Encounter: Payer: Self-pay | Admitting: Family Medicine

## 2017-08-29 ENCOUNTER — Ambulatory Visit (INDEPENDENT_AMBULATORY_CARE_PROVIDER_SITE_OTHER): Payer: Medicaid Other | Admitting: Family Medicine

## 2017-08-29 VITALS — BP 110/80 | HR 57 | Temp 98.4°F | Resp 16 | Wt 145.0 lb

## 2017-08-29 DIAGNOSIS — R112 Nausea with vomiting, unspecified: Secondary | ICD-10-CM | POA: Diagnosis not present

## 2017-08-29 MED ORDER — ONDANSETRON 8 MG PO TBDP: 8.0000 mg | ORAL_TABLET | Freq: Three times a day (TID) | ORAL | 0 refills | Status: DC | PRN

## 2017-08-29 NOTE — Progress Notes (Signed)
Subjective:     Patient ID: Madison Dixon, female   DOB: 11/24/1995, 21 y.o.   MRN: [id number]  HPI  Chief Complaint  Patient presents with  . Nausea    Patient comes in office today with complaints of nausea for the past 3 days.   States she vomited once yesterday and missed work. Attributes trigger to a non-specified source of stress. Reports compliance with her mental health medication. No diarrhea. Wishes work excuse for yesterday.   Review of Systems     Objective:   Physical Exam  Constitutional: She appears well-developed and well-nourished. No distress.  Abdominal: Soft. Bowel sounds are normal. There is no tenderness.       Assessment:    1. Non-intractable vomiting with nausea, unspecified vomiting type - ondansetron (ZOFRAN ODT) 8 MG disintegrating tablet; Take 1 tablet (8 mg total) every 8 (eight) hours as needed by mouth for nausea or vomiting.  Dispense: 21 tablet; Refill: 0    Plan:    Work excuse for 08/28/17.

## 2017-08-29 NOTE — Patient Instructions (Signed)
Let us know if you get new symptoms.

## 2017-09-19 ENCOUNTER — Ambulatory Visit: Payer: Medicaid Other | Admitting: Obstetrics and Gynecology

## 2017-10-20 ENCOUNTER — Ambulatory Visit: Payer: Self-pay | Admitting: Family Medicine

## 2017-10-21 ENCOUNTER — Ambulatory Visit: Payer: Self-pay | Admitting: Family Medicine

## 2017-10-28 ENCOUNTER — Ambulatory Visit: Payer: Self-pay | Admitting: Physician Assistant

## 2017-11-02 ENCOUNTER — Ambulatory Visit: Payer: Medicaid Other

## 2017-12-11 ENCOUNTER — Ambulatory Visit (INDEPENDENT_AMBULATORY_CARE_PROVIDER_SITE_OTHER): Payer: Self-pay

## 2017-12-11 ENCOUNTER — Encounter (HOSPITAL_COMMUNITY): Payer: Self-pay | Admitting: Emergency Medicine

## 2017-12-11 ENCOUNTER — Ambulatory Visit (HOSPITAL_COMMUNITY)
Admission: EM | Admit: 2017-12-11 | Discharge: 2017-12-11 | Disposition: A | Discharge status: Home / Self Care | Payer: Self-pay | Attending: Internal Medicine | Admitting: Internal Medicine

## 2017-12-11 ENCOUNTER — Other Ambulatory Visit: Payer: Self-pay

## 2017-12-11 DIAGNOSIS — S8262XA Displaced fracture of lateral malleolus of left fibula, initial encounter for closed fracture: Secondary | ICD-10-CM

## 2017-12-11 MED ORDER — SALSALATE 500 MG PO TABS
500.0000 mg | ORAL_TABLET | Freq: Three times a day (TID) | ORAL | 0 refills | Status: AC
Start: 2017-12-11 — End: 2017-12-21

## 2017-12-11 NOTE — ED Triage Notes (Signed)
Pt states last night she was running and tripped and landed on her L ankle, felt a "crack".

## 2017-12-11 NOTE — Discharge Instructions (Signed)
Start salsalate as directed for pain and inflammation.  Ice compress.  Cam walker with crutches, remain non-weightbearing for now.  Follow-up with orthopedics for further evaluation and treatment needed.  Monitor for worsening of symptoms, numbness of the toes, changes in color of the toes, swelling to the toes will go to the emergency department for further evaluation.

## 2017-12-11 NOTE — ED Provider Notes (Signed)
[facility]    CSN: [id number] Arrival date & time: 12/11/17  1402     History   Chief Complaint Chief Complaint  Patient presents with  . Ankle Pain    HPI Madison Dixon is a 22 y.o. female.   22 year old female comes in for 1 day history of left ankle pain.  States she was running, was jumping over an object and landed on the lateral side of her ankle.  States she heard a crack during the process.  Was unable to bear weight immediately after the incident.  Has been doing ice compress without relief.  Continues to have swelling and painful weightbearing.  Denies numbness, tingling.      Past Medical History:  Diagnosis Date  . Anxiety   . Asthma    sports induced  . Bipolar disorder (HCC)   . Chronic pelvic pain in female 09/09/2016  . Depression   . Endometriosis determined by laparoscopy 09/09/2016  . Ovarian cyst   . Pilonidal cyst   . PONV (postoperative nausea and vomiting)     Patient Active Problem List   Diagnosis Date Noted  . Chronic pelvic pain in female 09/09/2016  . Endometriosis determined by laparoscopy 09/09/2016  . Anxiety disorder 12/05/2015  . PTSD (post-traumatic stress disorder) 11/10/2015  . Allergic rhinitis 05/10/2008  . Asthma, exogenous 05/10/2008    Past Surgical History:  Procedure Laterality Date  . COLONOSCOPY WITH ESOPHAGOGASTRODUODENOSCOPY (EGD)    . LAPAROSCOPY N/A 09/09/2016   Procedure: LAPAROSCOPY DIAGNOSTIC, FULGERATION OF ENDOMETRIOSIS;  Surgeon: Conard NovakStephen D Jackson, MD;  Location: [facility];  Service: Gynecology;  Laterality: N/A;  . PILONIDAL CYST EXCISION N/A 04/10/2015   Procedure: CYST EXCISION PILONIDAL EXTENSIVE;  Surgeon: Kieth BrightlySeeplaputhur G Sankar, MD;  Location: [facility];  Service: General;  Laterality: N/A;    OB History    Gravida Para Term Preterm AB Living   0 0 0 0 0 0   SAB TAB Ectopic Multiple Live Births   0 0 0 0        Obstetric Comments   1st Menstrual Cycle:  11        Home  Medications    Prior to Admission medications   Medication Sig Start Date End Date Taking? Authorizing Provider  fluticasone (FLONASE) 50 MCG/ACT nasal spray Place 2 sprays into both nostrils daily. 12/23/16   Trey SailorsPollak, Adriana M, PA-C  leuprolide (LUPRON DEPOT, 30-MONTH,) 11.25 MG injection Inject 11.25 mg into the muscle every 3 (three) months. 04/29/17   Conard NovakJackson, Stephen D, MD  levonorgestrel (MIRENA) 20 MCG/24HR IUD 1 Intra Uterine Device (1 each total) by Intrauterine route once. 12/13/16 12/13/16  Conard NovakJackson, Stephen D, MD  lisdexamfetamine (VYVANSE) 20 MG capsule Take 20 mg by mouth daily.    [provider]  lithium carbonate (ESKALITH) 450 MG CR tablet TAKE 1.5 (1&1/2) TABLET BY MOUTH AT BEDTIME 01/12/17   [provider]  norethindrone (AYGESTIN) 5 MG tablet Take 1 tablet (5 mg total) by mouth daily. 04/29/17   Conard NovakJackson, Stephen D, MD  ondansetron (ZOFRAN ODT) 8 MG disintegrating tablet Take 1 tablet (8 mg total) every 8 (eight) hours as needed by mouth for nausea or vomiting. 08/29/17   Anola Gurneyhauvin, Robert, PA  propranolol ER (INDERAL LA) 80 MG 24 hr capsule Take by mouth daily. 01/26/17   [provider]  salsalate (DISALCID) 500 MG tablet Take 1-2 tablets (500-1,000 mg total) by mouth 3 (three) times daily with meals for 10 days. 12/11/17 12/21/17  Belinda Fisher, PA-C    Family History Family History  Problem Relation Age of Onset  . Hyperlipidemia Father   . GER disease Father     Social History Social History   Tobacco Use  . Smoking status: Never Smoker  . Smokeless tobacco: Never Used  Substance Use Topics  . Alcohol use: No    Alcohol/week: 0.0 oz  . Drug use: Yes    Types: Marijuana     Allergies   Vicodin [hydrocodone-acetaminophen]   Review of Systems Review of Systems  Reason unable to perform ROS: See HPI as above.     Physical Exam Triage Vital Signs ED Triage Vitals [12/11/17 1601]  Enc Vitals Group     BP 121/80     Pulse Rate 86     Resp 16      Temp 98.7 F (37.1 C)     Temp Source Oral     SpO2 98 %     Weight      Height      Head Circumference      Peak Flow      Pain Score 5     Pain Loc      Pain Edu?      Excl. in GC?    No data found.  Updated Vital Signs BP 121/80 (BP Location: Right Arm)   Pulse 86   Temp 98.7 F (37.1 C) (Oral)   Resp 16   SpO2 98%   Physical Exam  Constitutional: She is oriented to person, place, and time. She appears well-developed and well-nourished. No distress.  HENT:  Head: Normocephalic and atraumatic.  Eyes: Conjunctivae are normal. Pupils are equal, round, and reactive to light.  Musculoskeletal:  Swelling and contusion of the lateral left ankle.  Tenderness to palpation of dorsal and lateral aspect of the ankle.  Tenderness to palpation of proximal MTPs.  Range of motion and strength deferred due to appearance of ankle.  Sensation intact and equal bilaterally.  Pedal pulses 2+ ankle bilaterally.  Cap refill less than 2 seconds.  Neurological: She is alert and oriented to person, place, and time.     UC Treatments / Results  Labs (all labs ordered are listed, but only abnormal results are displayed) Labs Reviewed - No data to display  EKG  EKG Interpretation None       Radiology Dg Ankle Complete Left  Result Date: 12/11/2017 CLINICAL DATA:  Left ankle injury while running. Anterior and lateral ankle pain. Initial encounter. EXAM: LEFT ANKLE COMPLETE - 3+ VIEW COMPARISON:  None. FINDINGS: There is a nondisplaced transverse fracture through the lateral malleolus with overlying soft tissue swelling. The distal tibia appears intact. No dislocation or subluxation is evident. IMPRESSION: Nondisplaced lateral malleolus fracture. Electronically Signed   By: Sebastian Ache M.D.   On: 12/11/2017 16:15    Procedures Procedures (including critical care time)  Medications Ordered in UC Medications - No data to display   Initial Impression / Assessment and Plan / UC Course   I have reviewed the triage vital signs and the nursing notes.  Pertinent labs & imaging results that were available during my care of the patient were reviewed by me and considered in my medical decision making (see chart for details).    Discussed x-ray results with patient.  Cam walker, crutches, patient to be nonweightbearing until cleared by orthopedics.  Patient deferred narcotics.  Salsalate as directed given patient on lithium.  Ice compress, elevation.  Follow-up  with orthopedics for further evaluation.  Return precautions given.  Patient expresses understanding and agrees to plan.  Final Clinical Impressions(s) / UC Diagnoses   Final diagnoses:  Closed fracture of distal lateral malleolus of left fibula, initial encounter    ED Discharge Orders        Ordered    salsalate (DISALCID) 500 MG tablet  3 times daily with meals     12/11/17 1641        Belinda Fisher, PA-C 12/11/17 1814

## 2017-12-16 ENCOUNTER — Ambulatory Visit (INDEPENDENT_AMBULATORY_CARE_PROVIDER_SITE_OTHER): Payer: Medicaid Other | Admitting: Family Medicine

## 2017-12-16 ENCOUNTER — Encounter: Payer: Self-pay | Admitting: Family Medicine

## 2017-12-16 VITALS — BP 110/72 | HR 73 | Temp 98.3°F

## 2017-12-16 DIAGNOSIS — S8262XA Displaced fracture of lateral malleolus of left fibula, initial encounter for closed fracture: Secondary | ICD-10-CM

## 2017-12-16 NOTE — Patient Instructions (Signed)
Nondisplaced Fibular Ankle Fracture Treated With Immobilization, Adult A nondisplaced fibular ankle fracture is a simple break of the bottom of the fibula (lateral malleolus). The fibula is a bone in the lower leg, between the knee and the foot. In a nondisplaced fracture, the pieces of the broken bone line up with each other and are not out of place. This condition usually does not need surgery and can be treated with a splint or cast. What are the causes? This condition may be caused by:  A hard, direct hit (blow) or injury to the side of the leg.  A powerful twisting or rotating movement.  Rolling the ankle.  Falling or tripping.  What increases the risk? You are more likely to develop this condition if:  You play sports that involve a lot of running and pivoting, such as basketball.  You play impact sports, such as football or soccer.  You smoke.  You have diabetes.  You have a history of ankle fractures.  You are obese.  What are the signs or symptoms? Symptoms of this condition include:  Severe pain that begins immediately after the injury.  Bruising.  Swelling.  Inability to put weight on the injured ankle.  An ankle that is tender to the touch.  How is this diagnosed? This condition is diagnosed based on:  Your medical history.  A physical exam.  Imaging tests to confirm the fracture and to evaluate the extent of the injury. These tests may include: ? X-rays. ? Stress X-ray. During this test, your health care provider will put pressure on your ankle while taking an X-ray. This will help to determine whether your ankle is stable. ? CT scan. ? MRI.  How is this treated? This condition may be treated with:  A splint.  Icing and raising (elevating) the ankle.  A cast.  A removable cast or walking "boot."  Crutches. These may be needed to help you get around.  Follow these instructions at home: Managing pain, stiffness, and swelling  If  directed, put ice on the injured area. ? If you have a removable splint or cast, remove it as told by your health care provider. ? Put ice in a plastic bag. ? Place a towel between your skin and the bag, or between your cast and the bag. ? Leave the ice on for 20 minutes, 2-3 times a day.  Raise (elevate) the injured area above the level of your heart while you are sitting or lying down.  Move your toes often to avoid stiffness and to lessen swelling.  Use crutches as directed. Resume walking without crutches as directed by your health care provider or when you are comfortable doing that. If you have a removable splint or cast:  Wear the removable splint or cast as told by your health care provider. Remove it only as told by your health care provider.  Loosen the splint or cast if your toes tingle, become numb, or turn cold and blue.  Keep the splint or cast clean.  If the splint or cast is not waterproof: ? Do not let it get wet. ? Cover it with a watertight covering when you take a bath or a shower. If you have a cast that cannot be removed:  Do not stick anything inside the cast to scratch your skin. Doing that increases your risk of infection.  Check the skin around the cast every day. Contact your health care provider if you notice any redness, irritation, or swelling.  You may put lotion on dry skin around the edges of the cast. Do not put lotion on the skin underneath the cast.  Keep the cast clean.  Do not break off edges or trim your cast.  If the cast is not waterproof: ? Do not let it get wet. ? Cover it with a watertight covering when you take a bath or a shower. Activity  Do exercises and stretches as told by your health care provider.  Ask your health care provider when it is safe to drive if you have a cast or splint on your ankle.  Do not drive or use heavy machinery while taking prescription pain medicine. General instructions  Take over-the-counter and  prescription medicines only as told by your health care provider.  Do not take baths, swim, or use a hot tub until your health care provider approves. Ask your health care provider if you can take showers. You may only be allowed to take sponge baths for bathing.  Do not use the injured leg to support your body weight until your health care provider says that you can. Use crutches as told by your health care provider.  Do not use any products that contain nicotine or tobacco, such as cigarettes and e-cigarettes. These can delay bone healing. If you need help quitting, ask your health care provider.  Keep all follow-up visits as told by your health care provider. This is important. Contact a health care provider if:  Your cast gets damaged or it breaks.  Your pain does not get better with medicine. Get help right away if:  You develop severe pain or more swelling in your ankle or foot that cannot be controlled with medicines.  Your skin or nails below the injury turn blue or gray, feel cold, or become numb.  The skin under your cast burns or stings.  There is a bad smell or pus coming from under the cast.  You cannot move your toes. Summary  A nondisplaced fibular ankle fracture is a simple break of the bottom of a bone in the lower leg (fibula).  This condition may be treated with a splint, icing and elevation, a cast, or a removable cast or walking "boot." You may also need crutches to get around while your ankle heals.  To help manage pain, stiffness, swelling, put ice on the injured area as directed by your health care provider.  You should not use the injured leg to support your body weight until your health care provider says that you can. Use crutches as told by your health care provider. This information is not intended to replace advice given to you by your health care provider. Make sure you discuss any questions you have with your health care provider. Document Released:  07/03/2002 Document Revised: 09/20/2016 Document Reviewed: 09/20/2016 Elsevier Interactive Patient Education  2017 [employer].

## 2017-12-16 NOTE — Progress Notes (Signed)
Patient: Madison Dixon Female    DOB: [birth year]   22 y.o.   MRN: [id number] Visit Date: 12/16/2017  Today's Provider: Dortha Kernennis Maison Agrusa, PA   Chief Complaint  Patient presents with  . Ankle fracture   Subjective:    HPI  Patient presents today requesting a work excuse. She was seen on 12/11/17 [facility]. She has a closed fracture of distal lateral malleolus of left fibula. She was prescribed Salsalate 500 mg. She states she is not taking the medication. She is taking Tramadol but unsure of the dose. She was also advised to follow up with Dr. Ramond Marrowax Varkey (Orthopedic Surgeon). Patient states she called to schedule appointment and the office requires a $250 upfront co pay before being seen. Patient states she is not able to afford the co pay at this time.    Past Medical History:  Diagnosis Date  . Anxiety   . Asthma    sports induced  . Bipolar disorder (HCC)   . Chronic pelvic pain in female 09/09/2016  . Depression   . Endometriosis determined by laparoscopy 09/09/2016  . Ovarian cyst   . Pilonidal cyst   . PONV (postoperative nausea and vomiting)    Past Surgical History:  Procedure Laterality Date  . COLONOSCOPY WITH ESOPHAGOGASTRODUODENOSCOPY (EGD)    . LAPAROSCOPY N/A 09/09/2016   Procedure: LAPAROSCOPY DIAGNOSTIC, FULGERATION OF ENDOMETRIOSIS;  Surgeon: Conard NovakStephen D Jackson, MD;  Location: [facility] ORS;  Service: Gynecology;  Laterality: N/A;  . PILONIDAL CYST EXCISION N/A 04/10/2015   Procedure: CYST EXCISION PILONIDAL EXTENSIVE;  Surgeon: Kieth BrightlySeeplaputhur G Sankar, MD;  Location: [facility] ORS;  Service: General;  Laterality: N/A;   Family History  Problem Relation Age of Onset  . Hyperlipidemia Father   . GER disease Father    Allergies  Allergen Reactions  . Aripiprazole Nausea And Vomiting  . Vicodin [Hydrocodone-Acetaminophen] Nausea And Vomiting    Current Outpatient Medications:  .  fluticasone (FLONASE) 50 MCG/ACT nasal spray, Place 2 sprays into both  nostrils daily. (Patient not taking: Reported on 12/16/2017), Disp: 16 g, Rfl: 6 .  leuprolide (LUPRON DEPOT, 85-MONTH,) 11.25 MG injection, Inject 11.25 mg into the muscle every 3 (three) months. (Patient not taking: Reported on 12/16/2017), Disp: 1 each, Rfl: 3 .  levonorgestrel (MIRENA) 20 MCG/24HR IUD, 1 Intra Uterine Device (1 each total) by Intrauterine route once., Disp: 1 each, Rfl: 0 .  lisdexamfetamine (VYVANSE) 20 MG capsule, Take 20 mg by mouth daily., Disp: , Rfl:  .  lithium carbonate (ESKALITH) 450 MG CR tablet, TAKE 1.5 (1&1/2) TABLET BY MOUTH AT BEDTIME, Disp: , Rfl: 2 .  norethindrone (AYGESTIN) 5 MG tablet, Take 1 tablet (5 mg total) by mouth daily. (Patient not taking: Reported on 12/16/2017), Disp: 30 tablet, Rfl: 11 .  ondansetron (ZOFRAN ODT) 8 MG disintegrating tablet, Take 1 tablet (8 mg total) every 8 (eight) hours as needed by mouth for nausea or vomiting. (Patient not taking: Reported on 12/16/2017), Disp: 21 tablet, Rfl: 0 .  propranolol ER (INDERAL LA) 80 MG 24 hr capsule, Take by mouth daily., Disp: , Rfl: 0 .  salsalate (DISALCID) 500 MG tablet, Take 1-2 tablets (500-1,000 mg total) by mouth 3 (three) times daily with meals for 10 days. (Patient not taking: Reported on 12/16/2017), Disp: 60 tablet, Rfl: 0  Review of Systems  Constitutional: Negative.   Respiratory: Negative.   Cardiovascular: Negative.   Musculoskeletal:       Fracture  Social History   Tobacco Use  . Smoking status: Never Smoker  . Smokeless tobacco: Never Used  Substance Use Topics  . Alcohol use: No    Alcohol/week: 0.0 oz   Objective:   BP 110/72 (BP Location: Right Arm, Patient Position: Sitting, Cuff Size: Normal)   Pulse 73   Temp 98.3 F (36.8 C) (Oral)   SpO2 98%   Physical Exam  Constitutional: She is oriented to person, place, and time. She appears well-developed and well-nourished. No distress.  HENT:  Head: Normocephalic and atraumatic.  Right Ear: Hearing normal.  Left  Ear: Hearing normal.  Nose: Nose normal.  Eyes: Conjunctivae and lids are normal. Right eye exhibits no discharge. Left eye exhibits no discharge. No scleral icterus.  Pulmonary/Chest: Effort normal. No respiratory distress.  Musculoskeletal: Normal range of motion.  Pain in the left lateral malleolus with ecchymosis and slight swelling along the lateral foot. No numbness.  Neurological: She is alert and oriented to person, place, and time.  Skin: Skin is intact. No lesion and no rash noted.  Psychiatric: She has a normal mood and affect. Her speech is normal and behavior is normal. Thought content normal.      Assessment & Plan:     1. Closed fracture of distal lateral malleolus of left fibula, initial encounter Was running and jumped over an object on 12-11-17. Landed on the inverted left ankle and sustained a transverse non-displaced fracture through the distal malleolus of the left fibula. Was evaluated at an Urgent Care Clinic and placed in a walking cast with crutches. Using Tramadol prn pain and elevating foot as much as possible. Note written for out of work if she can't sit down and elevate the foot frequently for the next week. Will recheck in 7-10 days to recheck x-ray. May not need orthopedic referral if alignment is maintained.       Dortha Kern, PA  [facility]

## 2017-12-23 ENCOUNTER — Ambulatory Visit
Admission: RE | Admit: 2017-12-23 | Discharge: 2017-12-23 | Disposition: A | Discharge status: Home / Self Care | Payer: Self-pay | Source: Ambulatory Visit | Attending: Family Medicine | Admitting: Family Medicine

## 2017-12-23 ENCOUNTER — Ambulatory Visit (INDEPENDENT_AMBULATORY_CARE_PROVIDER_SITE_OTHER): Payer: Self-pay | Admitting: Family Medicine

## 2017-12-23 ENCOUNTER — Encounter: Payer: Self-pay | Admitting: Family Medicine

## 2017-12-23 VITALS — BP 110/60 | HR 76 | Temp 97.7°F | Wt 160.0 lb

## 2017-12-23 DIAGNOSIS — S8265XD Nondisplaced fracture of lateral malleolus of left fibula, subsequent encounter for closed fracture with routine healing: Secondary | ICD-10-CM | POA: Insufficient documentation

## 2017-12-23 DIAGNOSIS — X58XXXD Exposure to other specified factors, subsequent encounter: Secondary | ICD-10-CM | POA: Insufficient documentation

## 2017-12-23 NOTE — Progress Notes (Signed)
Patient: Madison Dixon Female    DOB: [birth year]   22 y.o.   MRN: [id number] Visit Date: 12/23/2017  Today's Provider: Dortha Kernennis Johncarlos Holtscl[place], PA   Chief Complaint  Patient presents with  . Closed fracture of left distal fibula follow up   Subjective:    HPI Closed fracture of left distal fibula follow up:  Patient presents for a 1 week follow up. Last OV was on 12/16/17. Patient advised to follow up to recheck xray to be sure Orthopedic referral is not needed. Patient states the pain has improved, but if she stands a long time the pain is worse. Patient is requesting a work excuse for yesterday and possibly today since she is still having a lot of pain.   Past Medical History:  Diagnosis Date  . Anxiety   . Asthma    sports induced  . Bipolar disorder (HCC)   . Chronic pelvic pain in female 09/09/2016  . Depression   . Endometriosis determined by laparoscopy 09/09/2016  . Ovarian cyst   . Pilonidal cyst   . PONV (postoperative nausea and vomiting)    Past Surgical History:  Procedure Laterality Date  . COLONOSCOPY WITH ESOPHAGOGASTRODUODENOSCOPY (EGD)    . LAPAROSCOPY N/A 09/09/2016   Procedure: LAPAROSCOPY DIAGNOSTIC, FULGERATION OF ENDOMETRIOSIS;  Surgeon: Conard NovakStephen D Jackson, MD;  Location: [facility] ORS;  Service: Gynecology;  Laterality: N/A;  . PILONIDAL CYST EXCISION N/A 04/10/2015   Procedure: CYST EXCISION PILONIDAL EXTENSIVE;  Surgeon: Kieth BrightlySeeplaputhur G Sankar, MD;  Location: [facility] OR[facility];  Service: General;  Laterality: N/A;   Family History  Problem Relation Age of Onset  . Hyperlipidemia Father   . GER disease Father    Allergies  Allergen Reactions  . Aripiprazole Nausea And Vomiting  . Vicodin [Hydrocodone-Acetaminophen] Nausea And Vomiting    Current Outpatient Medications:  .  lisdexamfetamine (VYVANSE) 20 MG capsule, Take 20 mg by mouth daily., Disp: , Rfl:  .  lithium carbonate (ESKALITH) 450 MG CR tablet, TAKE 1.5 (1&1/2) TABLET BY MOUTH AT BEDTIME, Disp: ,  Rfl: 2 .  propranolol ER (INDERAL LA) 80 MG 24 hr capsule, Take by mouth daily., Disp: , Rfl: 0 .  fluticasone (FLONASE) 50 MCG/ACT nasal spray, Place 2 sprays into both nostrils daily. (Patient not taking: Reported on 12/16/2017), Disp: 16 g, Rfl: 6 .  leuprolide (LUPRON DEPOT, 64-MONTH,) 11.25 MG injection, Inject 11.25 mg into the muscle every 3 (three) months. (Patient not taking: Reported on 12/16/2017), Disp: 1 each, Rfl: 3 .  levonorgestrel (MIRENA) 20 MCG/24HR IUD, 1 Intra Uterine Device (1 each total) by Intrauterine route once., Disp: 1 each, Rfl: 0 .  norethindrone (AYGESTIN) 5 MG tablet, Take 1 tablet (5 mg total) by mouth daily. (Patient not taking: Reported on 12/16/2017), Disp: 30 tablet, Rfl: 11 .  ondansetron (ZOFRAN ODT) 8 MG disintegrating tablet, Take 1 tablet (8 mg total) every 8 (eight) hours as needed by mouth for nausea or vomiting. (Patient not taking: Reported on 12/16/2017), Disp: 21 tablet, Rfl: 0  Review of Systems  Constitutional: Negative.   Respiratory: Negative.   Cardiovascular: Negative.   Musculoskeletal:       Closed fracture of left distal fibula    Social History   Tobacco Use  . Smoking status: Never Smoker  . Smokeless tobacco: Never Used  Substance Use Topics  . Alcohol use: No    Alcohol/week: 0.0 oz   Objective:   BP 110/60 (BP Location: Right Arm, Patient Position: Sitting,  Cuff Size: Normal)   Pulse 76   Temp 97.7 F (36.5 C) (Oral)   Wt 160 lb (72.6 kg)   SpO2 99%   BMI 29.26 kg/m   Physical Exam  Constitutional: She is oriented to person, place, and time. She appears well-developed and well-nourished. No distress.  HENT:  Head: Normocephalic and atraumatic.  Right Ear: Hearing normal.  Left Ear: Hearing normal.  Nose: Nose normal.  Eyes: Conjunctivae and lids are normal. Right eye exhibits no discharge. Left eye exhibits no discharge. No scleral icterus.  Pulmonary/Chest: Effort normal. No respiratory distress.  Musculoskeletal:  Normal range of motion. She exhibits edema and tenderness.  Slight puffiness around the left ankle and foot with ecchymosis medial and lateral ankle. Faint greenish bruise dorsum of lateral foot. Good pedal pulse. Very tender to palpate around lateral ankle or try to flex.  Neurological: She is alert and oriented to person, place, and time.  Skin: Skin is intact. No lesion and no rash noted.  Psychiatric: She has a normal mood and affect. Her speech is normal and behavior is normal. Thought content normal.      Assessment & Plan:     1. Closed nondisplaced fracture of lateral malleolus of left fibula with routine healing, subsequent encounter Tried to work some this week but was on her feet nearly all day. This caused more pain. Also, felt/heard a "pop" in the ankle climbing steps going to her apartment yesterday. No chest pains or dyspnea. Will recheck x-ray to see if alignment maintained. Out of work from 12-22-17 through 01-02-18 and recheck. If x-ray shows displacement, will need referral to orthopedist. - DG Ankle Complete Left       Dortha Kern, PA  [facility]

## 2018-01-02 ENCOUNTER — Ambulatory Visit (INDEPENDENT_AMBULATORY_CARE_PROVIDER_SITE_OTHER): Payer: Self-pay | Admitting: Family Medicine

## 2018-01-02 ENCOUNTER — Encounter: Payer: Self-pay | Admitting: Family Medicine

## 2018-01-02 VITALS — BP 122/70 | Temp 98.4°F | Resp 16

## 2018-01-02 DIAGNOSIS — S8262XD Displaced fracture of lateral malleolus of left fibula, subsequent encounter for closed fracture with routine healing: Secondary | ICD-10-CM

## 2018-01-02 NOTE — Progress Notes (Signed)
Patient: Madison Dixon Female    DOB: Dec 03, 1995   22 y.o.   MRN: [id number] Visit Date: 01/02/2018  Today's Provider: Dortha Kernennis Monic Engelmann, PA   Chief Complaint  Patient presents with  . Closed fracture of left distal fibula follow up   Subjective:    HPI Closed fracture of left distal fibula follow up: Patient presents today for a 10 day follow up. Her last visit was 12/23/17. She stated to have more pain in her ankle, and Xray was WNL. Patient was advised to continue to wear her boot and crutches, and elevate her foot as much as possible. Patient reports that she is feeling much better today, and does not feel the need to wear her crutches.     Allergies  Allergen Reactions  . Aripiprazole Nausea And Vomiting  . Vicodin [Hydrocodone-Acetaminophen] Nausea And Vomiting    Current Outpatient Medications:  .  lisdexamfetamine (VYVANSE) 20 MG capsule, Take 20 mg by mouth daily., Disp: , Rfl:  .  lithium carbonate (ESKALITH) 450 MG CR tablet, TAKE 1.5 (1&1/2) TABLET BY MOUTH AT BEDTIME, Disp: , Rfl: 2 .  propranolol ER (INDERAL LA) 80 MG 24 hr capsule, Take by mouth daily., Disp: , Rfl: 0 .  fluticasone (FLONASE) 50 MCG/ACT nasal spray, Place 2 sprays into both nostrils daily. (Patient not taking: Reported on 12/16/2017), Disp: 16 g, Rfl: 6 .  leuprolide (LUPRON DEPOT, 59-MONTH,) 11.25 MG injection, Inject 11.25 mg into the muscle every 3 (three) months. (Patient not taking: Reported on 12/16/2017), Disp: 1 each, Rfl: 3 .  levonorgestrel (MIRENA) 20 MCG/24HR IUD, 1 Intra Uterine Device (1 each total) by Intrauterine route once., Disp: 1 each, Rfl: 0 .  norethindrone (AYGESTIN) 5 MG tablet, Take 1 tablet (5 mg total) by mouth daily. (Patient not taking: Reported on 12/16/2017), Disp: 30 tablet, Rfl: 11 .  ondansetron (ZOFRAN ODT) 8 MG disintegrating tablet, Take 1 tablet (8 mg total) every 8 (eight) hours as needed by mouth for nausea or vomiting. (Patient not taking: Reported on  12/16/2017), Disp: 21 tablet, Rfl: 0  Review of Systems  Constitutional: Negative.   Respiratory: Negative for shortness of breath.   Cardiovascular: Negative for chest pain, palpitations and leg swelling.  Musculoskeletal: Positive for arthralgias, gait problem, joint swelling and myalgias.  Skin: Negative.   Neurological: Negative for dizziness, weakness and headaches.    Social History   Tobacco Use  . Smoking status: Never Smoker  . Smokeless tobacco: Never Used  Substance Use Topics  . Alcohol use: No    Alcohol/week: 0.0 oz   Objective:   BP 122/70 (BP Location: Left Arm, Patient Position: Sitting, Cuff Size: Normal)   Temp 98.4 F (36.9 C)   Resp 16  Vitals:   01/02/18 1100  BP: 122/70  Resp: 16  Temp: 98.4 F (36.9 C)     Physical Exam  Constitutional: She is oriented to person, place, and time. She appears well-developed and well-nourished. No distress.  HENT:  Head: Normocephalic and atraumatic.  Right Ear: Hearing normal.  Left Ear: Hearing normal.  Nose: Nose normal.  Eyes: Conjunctivae and lids are normal. Right eye exhibits no discharge. Left eye exhibits no discharge. No scleral icterus.  Pulmonary/Chest: Effort normal. No respiratory distress.  Musculoskeletal: Normal range of motion.  Good ankle ROM with only slight stiffness. Pulses 2+ and symmetric. No swelling. Able to bear weight without pain now. No bruising.  Neurological: She is alert and oriented to person,  place, and time.  Skin: Skin is intact. No lesion and no rash noted.  Psychiatric: She has a normal mood and affect. Her speech is normal and behavior is normal. Thought content normal.      Assessment & Plan:     1. Closed fracture of distal lateral malleolus of left fibula with routine healing, subsequent encounter Healing well without pain. No swelling or bruising now. Sleeping without boot cast. Continue to use boot at work/walking. No need for crutches now. Recheck in 2-3 weeks. May  return to full duties at work.       Dortha Kern, PA  [facility]

## 2018-03-14 IMAGING — CR DG CHEST 2V
1 series · 2 of 2 positions shown · non-contrast
Comparison: Radiographs November 04, 2014.

ADDENDUM:
The rounded sclerotic density seen over the thoracic spine on the
prior radiographs of August 06, 2016 is not visualized on the
lateral projection currently. It is uncertain if this represented
calcified granuloma or possibly pulmonary vessel en face on the
prior radiograph. Currently, no definite abnormal density is seen
involving the thoracic spine on the lateral projection.
CLINICAL DATA: Chest and back pain.  Shortness of breath.

EXAM:
CHEST  2 VIEW

[Series 1: dg chest 2 view · 0.14mm/px · 2 of 2 slices shown]
[im 1/2]
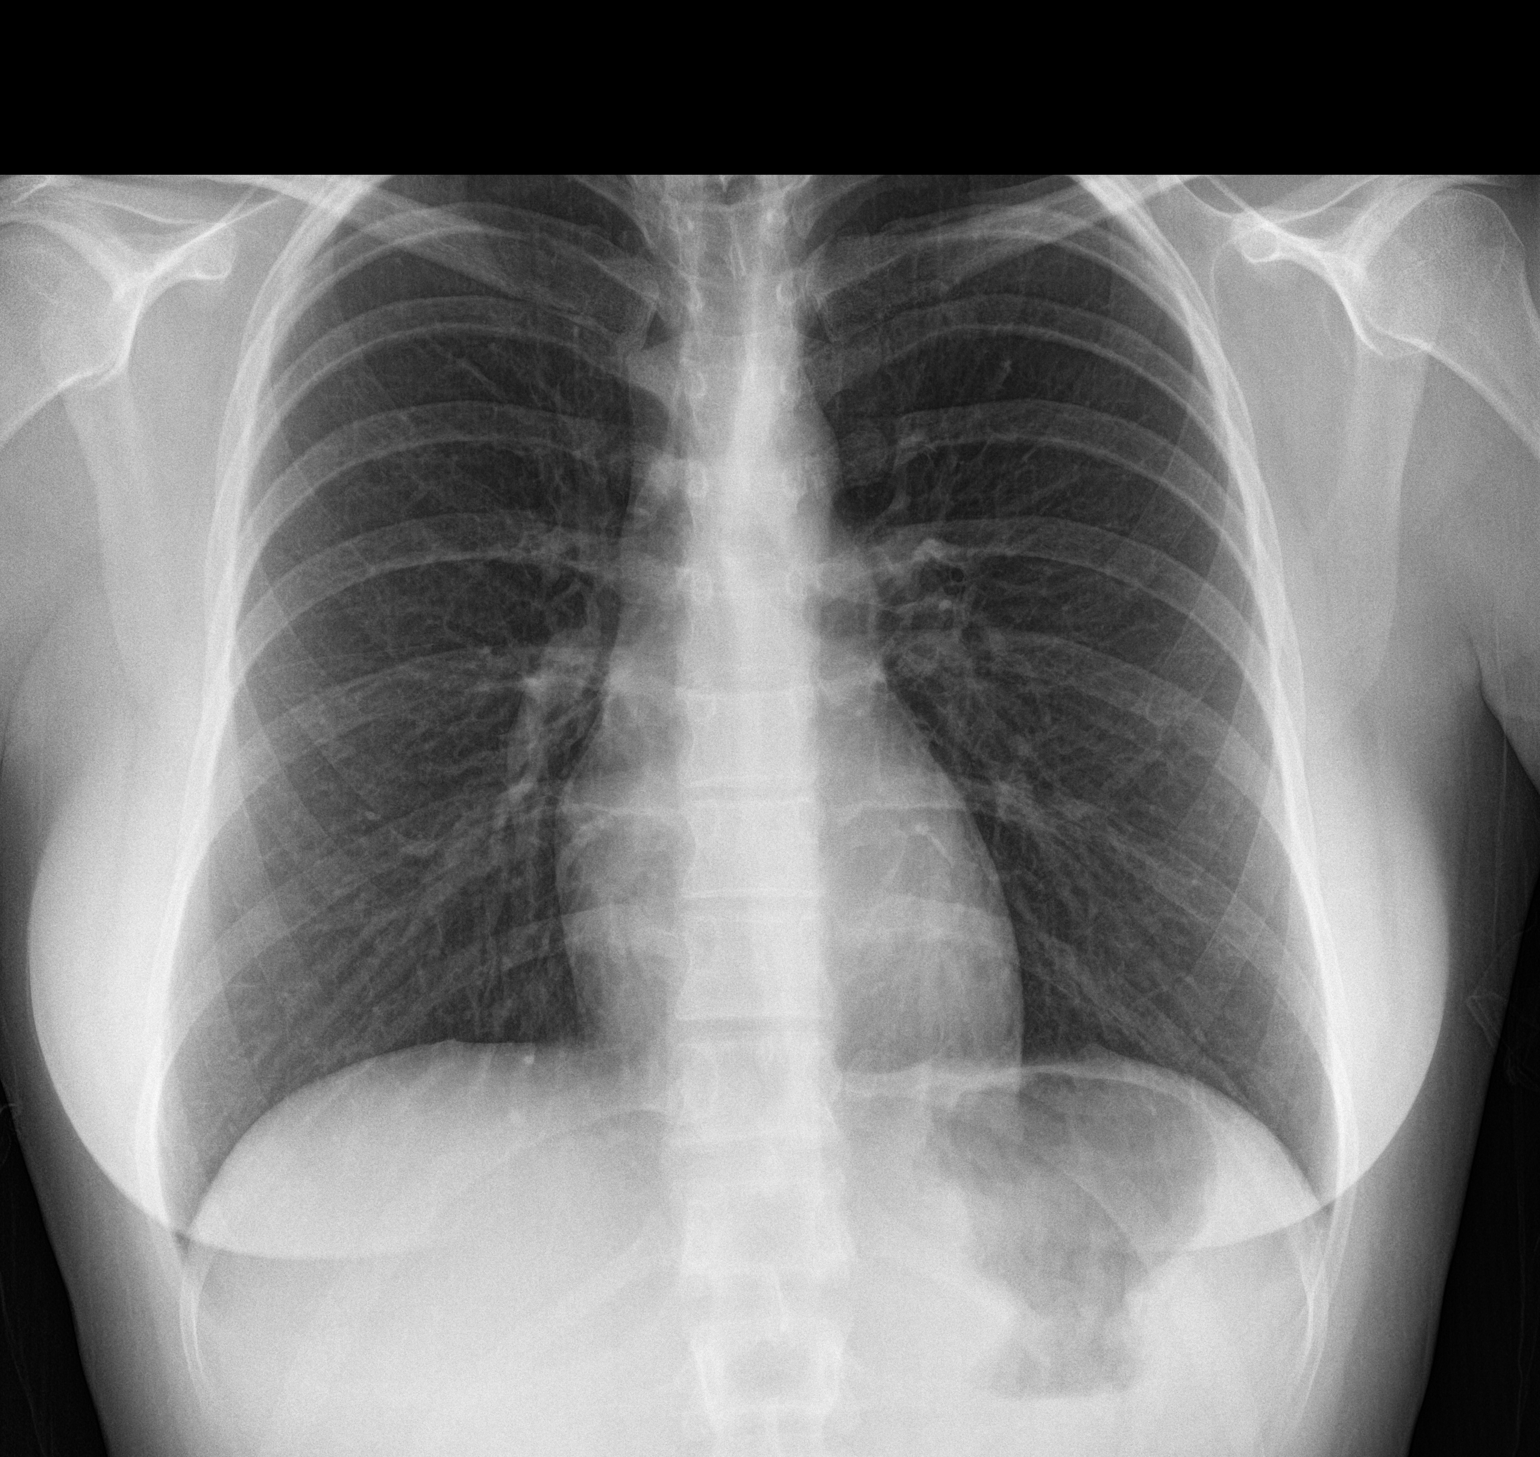
[im 2/2]
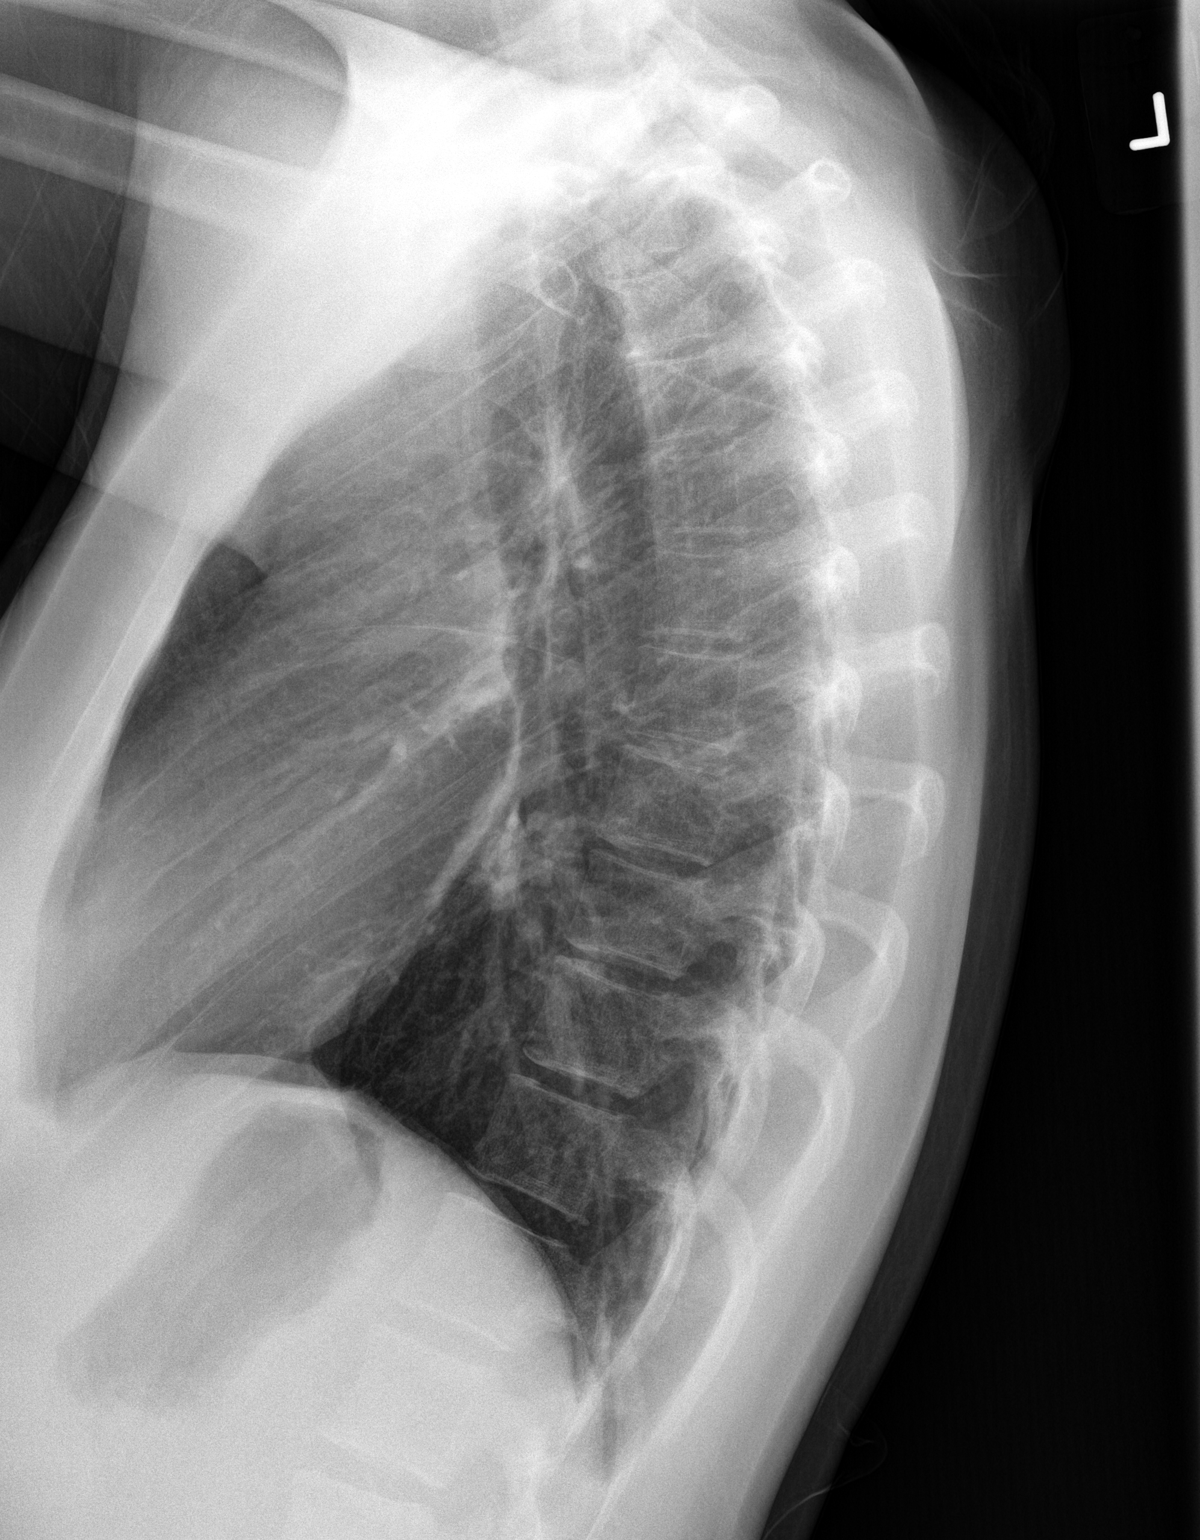

[2 of 2 positions shown; findings below may reference images not displayed]

FINDINGS: The heart size and mediastinal contours are within normal limits.
Both lungs are clear. No pneumothorax or pleural effusion is noted.
The visualized skeletal structures are unremarkable.
IMPRESSION: No active cardiopulmonary disease.

## 2018-03-27 ENCOUNTER — Encounter: Payer: Self-pay | Admitting: Family Medicine

## 2018-03-27 ENCOUNTER — Ambulatory Visit (INDEPENDENT_AMBULATORY_CARE_PROVIDER_SITE_OTHER): Payer: Self-pay | Admitting: Family Medicine

## 2018-03-27 VITALS — BP 122/64 | HR 108 | Temp 97.6°F | Wt 164.0 lb

## 2018-03-27 DIAGNOSIS — N912 Amenorrhea, unspecified: Secondary | ICD-10-CM

## 2018-03-27 DIAGNOSIS — R112 Nausea with vomiting, unspecified: Secondary | ICD-10-CM

## 2018-03-27 DIAGNOSIS — N644 Mastodynia: Secondary | ICD-10-CM

## 2018-03-27 LAB — POCT URINE PREGNANCY: Preg Test, Ur: NEGATIVE

## 2018-03-27 NOTE — Progress Notes (Signed)
Patient: Madison ConnersKayleigh M Miles Female    DOB: November 25, 1995   22 y.o.   MRN: [id number] Visit Date: 03/27/2018  Today's Provider: Dortha Kernennis Kaina Orengo, PA   Chief Complaint  Patient presents with  . Breast soreness, N&V, fatigue, and weight gain   Subjective:    HPI Patient presents today C/O breast soreness, N&V, unexpected weight gain, rib pain, and headaches. She states symptoms started approximately 2 weeks ago. Last menstrual cycle was approximately 1 year ago. She has a IUD that was placed in 10/2016.    Past Medical History:  Diagnosis Date  . Anxiety   . Asthma    sports induced  . Bipolar disorder (HCC)   . Chronic pelvic pain in female 09/09/2016  . Depression   . Endometriosis determined by laparoscopy 09/09/2016  . Ovarian cyst   . Pilonidal cyst   . PONV (postoperative nausea and vomiting)    Past Surgical History:  Procedure Laterality Date  . COLONOSCOPY WITH ESOPHAGOGASTRODUODENOSCOPY (EGD)    . LAPAROSCOPY N/A 09/09/2016   Procedure: LAPAROSCOPY DIAGNOSTIC, FULGERATION OF ENDOMETRIOSIS;  Surgeon: Conard NovakStephen D Jackson, MD;  Location: [facility];  Service: Gynecology;  Laterality: N/A;  . PILONIDAL CYST EXCISION N/A 04/10/2015   Procedure: CYST EXCISION PILONIDAL EXTENSIVE;  Surgeon: Kieth BrightlySeeplaputhur G Sankar, MD;  Location: [facility];  Service: General;  Laterality: N/A;   Family History  Problem Relation Age of Onset  . Hyperlipidemia Father   . GER disease Father    Allergies  Allergen Reactions  . Aripiprazole Nausea And Vomiting  . Vicodin [Hydrocodone-Acetaminophen] Nausea And Vomiting   No current outpatient medications on file.  Review of Systems  Constitutional: Positive for fatigue and unexpected weight change.  Respiratory: Negative.   Cardiovascular: Negative.   Gastrointestinal: Positive for vomiting (episodes).  Musculoskeletal:       Breast soreness and rib pain   Neurological: Positive for headaches.   Social History   Tobacco Use  . Smoking  status: Never Smoker  . Smokeless tobacco: Never Used  Substance Use Topics  . Alcohol use: No    Alcohol/week: 0.0 oz   Objective:   BP 122/64 (BP Location: Right Arm, Patient Position: Sitting, Cuff Size: Normal)   Pulse (!) 108   Temp 97.6 F (36.4 C) (Oral)   Wt 164 lb (74.4 kg)   SpO2 98%   BMI 30.00 kg/m  Wt Readings from Last 3 Encounters:  03/27/18 164 lb (74.4 kg)  12/23/17 160 lb (72.6 kg)  08/29/17 145 lb (65.8 kg)    Physical Exam  Constitutional: She is oriented to person, place, and time. She appears well-developed and well-nourished. No distress.  HENT:  Head: Normocephalic and atraumatic.  Right Ear: Hearing normal.  Left Ear: Hearing normal.  Nose: Nose normal.  Eyes: Conjunctivae and lids are normal. Right eye exhibits no discharge. Left eye exhibits no discharge. No scleral icterus.  Cardiovascular: Normal rate and regular rhythm.  Pulmonary/Chest: Effort normal and breath sounds normal. No respiratory distress.  Genitourinary: No vaginal discharge found.  Genitourinary Comments: IUD string located. Slight discomfort to palpate adnexa. History of endometriosis and no menses in the past year since implantation of the IUD. No breast masses palpable but tender inverted nipples and areola area. No nipple discharge or local lymphadenopathy noted. Cervix is reddened and positioned to the left. No uterine enlargement.  Musculoskeletal: Normal range of motion.  Neurological: She is alert and oriented to person, place, and time.  Skin: Skin is intact. No  lesion and no rash noted.  Psychiatric: She has a normal mood and affect. Her speech is normal and behavior is normal. Thought content normal.      Assessment & Plan:     1. Soreness breast Onset the past couple weeks without nipple discharge or noted masses. Tender to palpate but no local lymphadenopathy. No known injury. Urine pregnancy test negative. Will get serum HCG for confirmation. Doubt the Lupron  injections for endometriosis are causing symptoms since she has not had anymore injections in the past 6 months and she has had the Mirena IUD for a year now. May need referral back to her GYN pending lab reports - POCT urine pregnancy - hCG, serum, qualitative  2. Nausea and vomiting, intractability of vomiting not specified, unspecified vomiting type Has had intermittent episodes of vomiting the past couple week without diarrhea and continues to eat regular meals. Has gained 19 lbs in the past 7 months. Worried about possible pregnancy. Urine pregnancy test negative. Will get serum HCG for confirmation. May need pelvic ultrasound and/or GYN referral. - POCT urine pregnancy - hCG, serum, qualitative  3. Amenorrhea Onset a year ago when IUD implanted. Urine pregnancy negative. Will check serum HCG to confirm. May need pelvic ultrasound and/or GYN referral. - POCT urine pregnancy - hCG, serum, qualitative       Dortha Kern, PA  [facility]

## 2018-03-28 ENCOUNTER — Telehealth: Payer: Self-pay

## 2018-03-28 LAB — HCG, SERUM, QUALITATIVE: HCG, BETA SUBUNIT, QUAL, SERUM: NEGATIVE m[IU]/mL (ref <6)

## 2018-03-28 NOTE — Telephone Encounter (Signed)
-----   Message from Jodell Ciproennis E Newtonhrismon, [place] sent at 03/28/2018  8:12 AM EDT ----- Blood test confirms negative pregnancy test. Side effects of Lupron and Mirena IUD can cause these symptoms but usually not this long after stopping the Lupron or after having the IUD for over a year. Should recheck with GYN.

## 2018-03-28 NOTE — Telephone Encounter (Signed)
LMTCB, Results and result note released to my chart.

## 2018-03-30 NOTE — Telephone Encounter (Signed)
Attempted to contact patient no answer and unable to leave a message voicemail is full. Patient has not viewed results in mychart yet.

## 2018-03-31 NOTE — Telephone Encounter (Signed)
Viewed by Farrel ConnersKayleigh M Miles on 03/31/2018 10:39 AM   Chrismon, Jodell Ciproennis E, PA P Chrismon Nurse   Blood test confirms negative pregnancy test. Side effects of Lupron and Mirena IUD can cause these symptoms but usually not this long after stopping the Lupron or after having the IUD for over a year. Should recheck with GYN

## 2018-07-04 IMAGING — CT CT ABD-PELV W/ CM
2 of 4 series · 15 of 46 positions shown, 17 images · IV contrast (APPLIED)
Comparison: September 20, 2016

CLINICAL DATA: Four day history of right lower quadrant pain with
nausea and vomiting

EXAM:
CT ABDOMEN AND PELVIS WITH CONTRAST
TECHNIQUE: Multidetector CT imaging of the abdomen and pelvis was performed
using the standard protocol following bolus administration of
intravenous contrast. Oral contrast was also administered.
CONTRAST:  100mL FN25BY-XII IOPAMIDOL (FN25BY-XII) INJECTION 61%

[Series 2: axial st · axial · 0.65mm/px · z∈[-620,-180]mm · 12 of 98 slices shown, 14 images]
[im 5/98  soft-tissue]
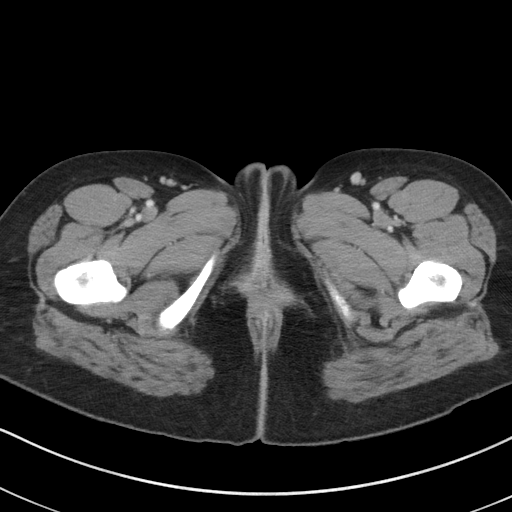
[im 5/98  bone]
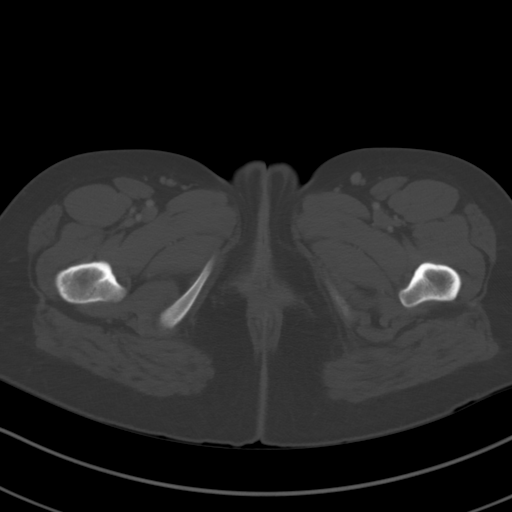
[im 13/98  soft-tissue]
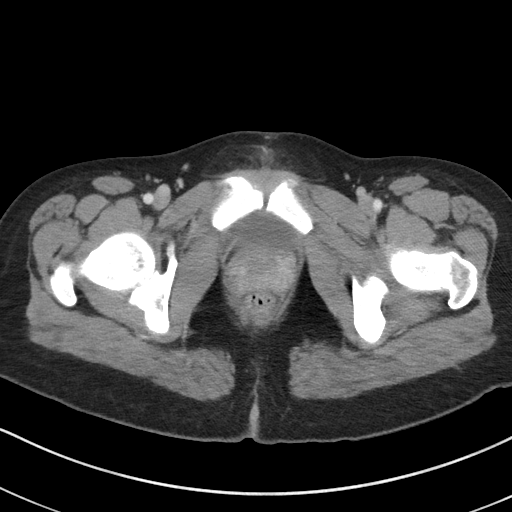
[im 21/98  soft-tissue]
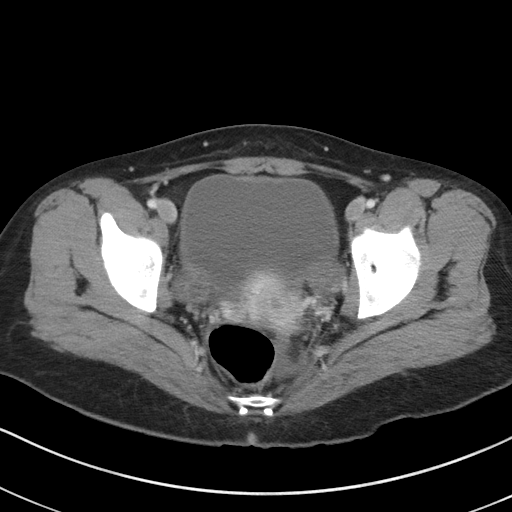
[im 29/98  soft-tissue]
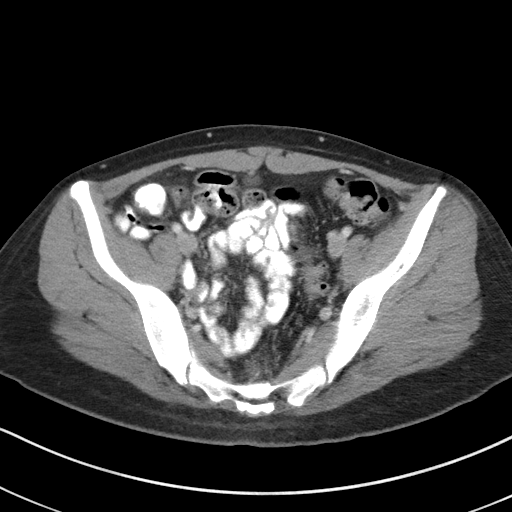
[im 37/98  soft-tissue]
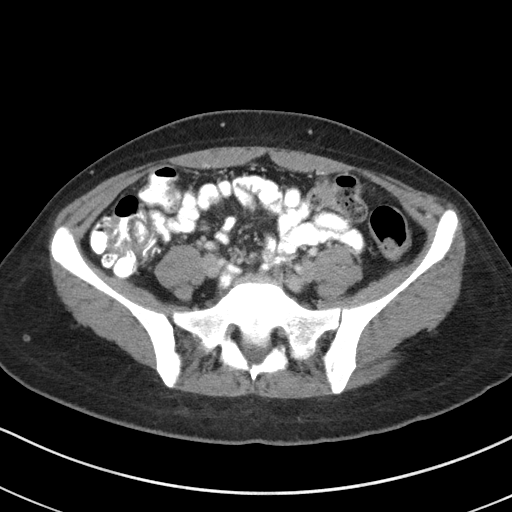
[im 45/98  soft-tissue]
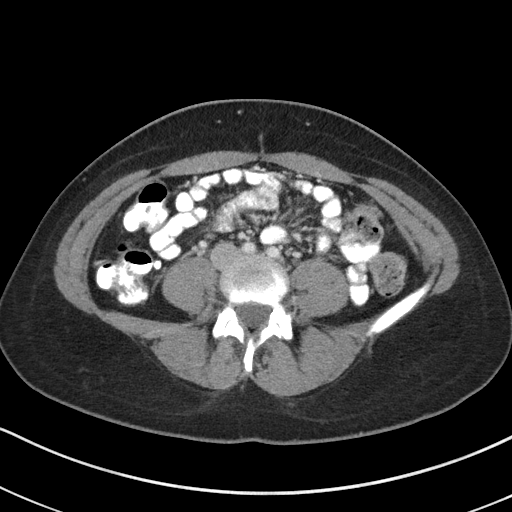
[im 53/98  soft-tissue]
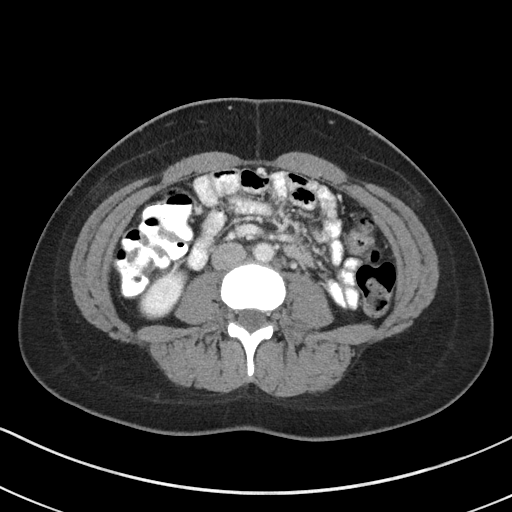
[im 61/98  soft-tissue]
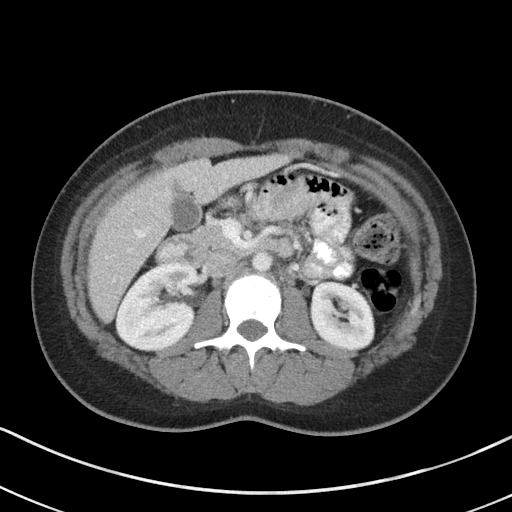
[im 69/98  soft-tissue]
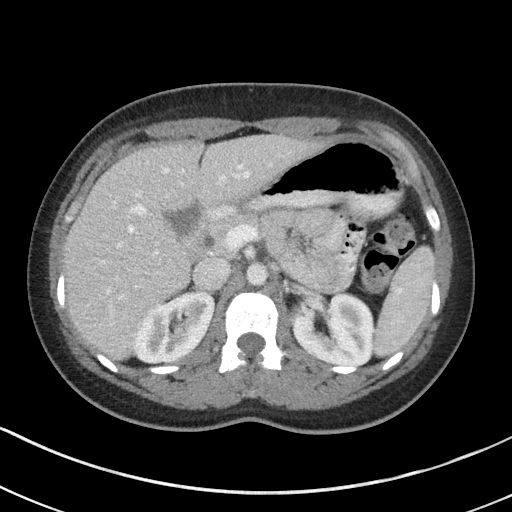
[im 69/98  bone]
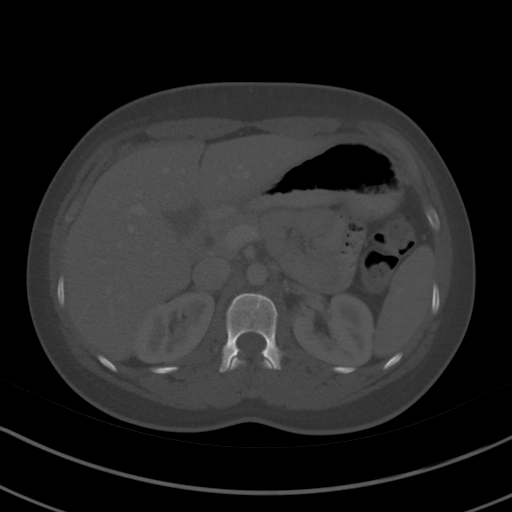
[im 77/98  soft-tissue]
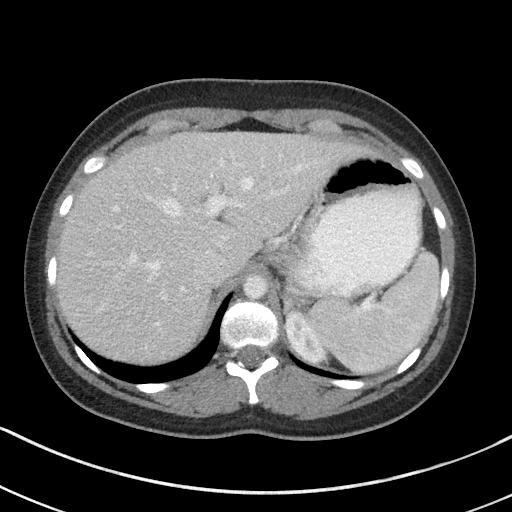
[im 85/98  soft-tissue]
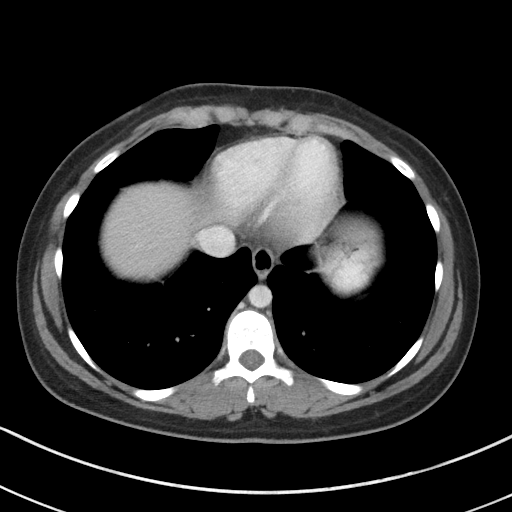
[im 93/98  soft-tissue]
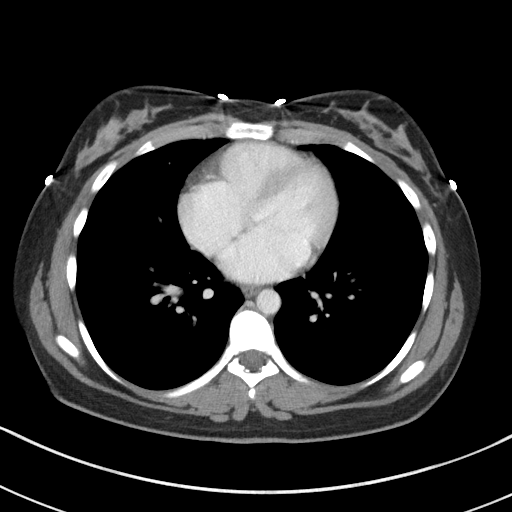

[Series 5: coronal st · coronal · 0.63mm/px · 3 of 70 slices shown]
[im 24/70  soft-tissue]
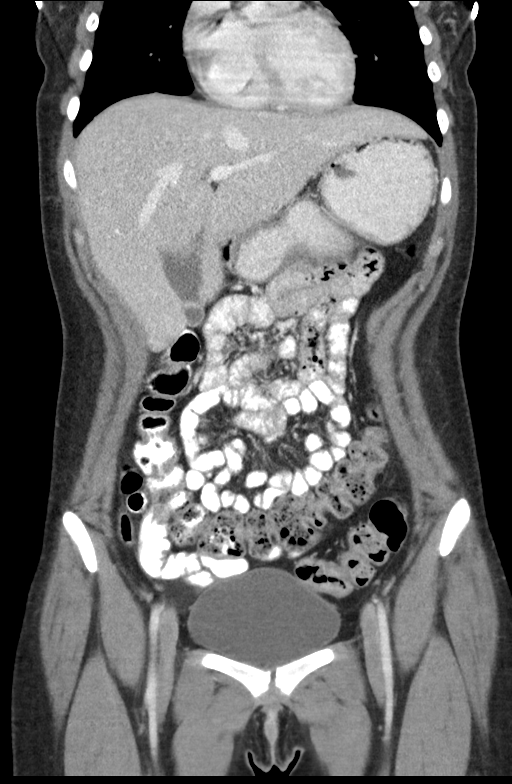
[im 31/70  soft-tissue]
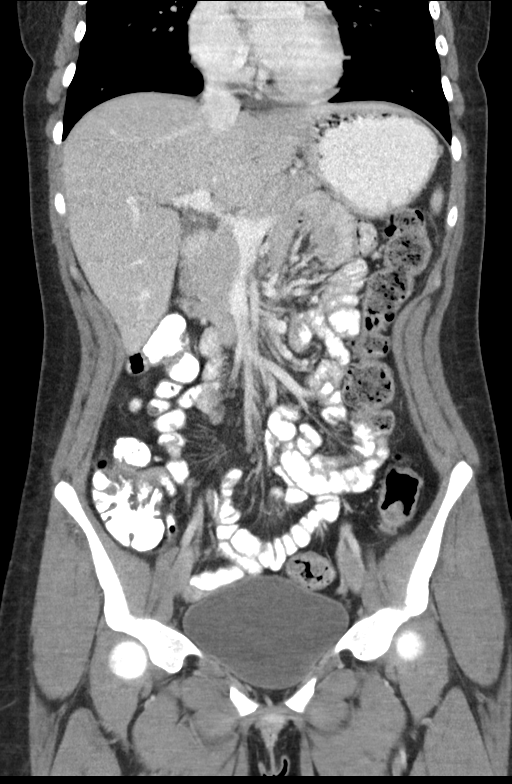
[im 39/70  soft-tissue]
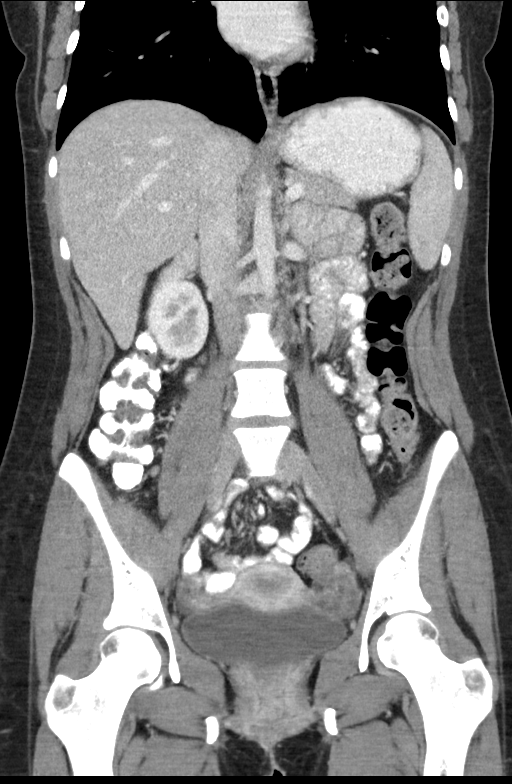

[15 of 46 positions shown; findings below may reference images not displayed]

FINDINGS: Lower chest: Lung bases are clear. There is mild air in the distal
esophagus.

Hepatobiliary: There is mild fatty infiltration near the fissure for
the ligamentum teres. No focal liver lesions are evident.
Gallbladder wall is not appreciably thickened. There is no biliary
duct dilatation

Pancreas: No pancreatic mass or inflammatory focus.

Spleen: No splenic lesions evident.

Adrenals/Urinary Tract: Adrenals appear normal bilaterally. Kidneys
bilaterally show no evidence of mass or hydronephrosis on either
side. There is no renal or ureteral calculus on either side. Urinary
bladder is midline wall thickness within normal limits.

Stomach/Bowel: There is no appreciable bowel wall or mesenteric
thickening. No bowel obstruction. No free air or portal venous air.

Vascular/Lymphatic: There is no abdominal aortic aneurysm. No
vascular lesions are evident. There is a circumaortic left renal
vein, an anatomic variant. There is no appreciable adenopathy in
abdomen or pelvis.

Reproductive: Uterus is anteverted. There is no pelvic mass. There
is a small amount of free fluid to the left of the rectum in the
lower left pelvis. A small amount free fluid is noted slightly to
the right of the rectum anteriorly.

Other: Appendix appears normal. There is no evident abscess in the
abdomen or pelvis. There is a minimal ventral hernia containing only
fat.

Musculoskeletal: There are no blastic or lytic bone lesions peer
there is no intramuscular or abdominal wall lesion.
IMPRESSION: Small amount of free fluid in the pelvis, slightly more on the left
than right. Suspect recent ovarian cyst rupture.

Appendix appears normal. No bowel obstruction or bowel wall
thickening. No abscess.

No renal or ureteral calculus.  No hydronephrosis.

Minimal ventral hernia containing fat.

## 2018-08-03 ENCOUNTER — Telehealth: Payer: Self-pay

## 2018-08-03 NOTE — Telephone Encounter (Signed)
Pt calling to report she took out her own IUD a few weeks ago. She states she is having some spotting and dark bleeding. I advised her it would take some time for her body to get back to normal not being on any hormones, pt reports breast tenderness, she also states she had unprotected intercourse after taking the iud out.. I advised pt to take a UPT if her next period does not come... Any other advise I could give Pt. Pt aware SDJ nurse will call him if any other advise

## 2018-08-08 ENCOUNTER — Ambulatory Visit (INDEPENDENT_AMBULATORY_CARE_PROVIDER_SITE_OTHER): Payer: Self-pay | Admitting: Family Medicine

## 2018-08-08 ENCOUNTER — Other Ambulatory Visit: Payer: Self-pay

## 2018-08-08 ENCOUNTER — Encounter: Payer: Self-pay | Admitting: Family Medicine

## 2018-08-08 VITALS — BP 106/78 | HR 80 | Temp 97.8°F | Ht 62.0 in | Wt 166.4 lb

## 2018-08-08 DIAGNOSIS — Z8719 Personal history of other diseases of the digestive system: Secondary | ICD-10-CM

## 2018-08-08 DIAGNOSIS — R112 Nausea with vomiting, unspecified: Secondary | ICD-10-CM

## 2018-08-08 DIAGNOSIS — N912 Amenorrhea, unspecified: Secondary | ICD-10-CM

## 2018-08-08 LAB — POCT URINE PREGNANCY: PREG TEST UR: NEGATIVE

## 2018-08-08 MED ORDER — ONDANSETRON 4 MG PO TBDP: 4.0000 mg | ORAL_TABLET | Freq: Three times a day (TID) | ORAL | 0 refills | Status: DC | PRN

## 2018-08-08 NOTE — Progress Notes (Signed)
Patient: Madison Dixon Female    DOB: [birth year]   22 y.o.   MRN: [id number] Visit Date: 08/08/2018  Today's Provider: Dortha Kern, PA   Chief Complaint  Patient presents with  . Abdominal Pain  . Nausea  . Emesis  . Vaginal Bleeding   Subjective:    HPI  Pt reports some pain in lower abdomen that started a few weeks before taking IUD out herself on 07/22/18.  She had her menstrual cycle 07/24/18 to 07/28/18 and has had some spotting on 07/04/18.  Pt has not been able to eat much and has been having nausea and vomiting.  If she drinks any alcohol she vomits. Pt also reports tha she has had unprotected sex since taking IUD out but feels that she is not pregnant.     Past Medical History:  Diagnosis Date  . Anxiety   . Asthma    sports induced  . Bipolar disorder (HCC)   . Chronic pelvic pain in female 09/09/2016  . Depression   . Endometriosis determined by laparoscopy 09/09/2016  . Ovarian cyst   . Pilonidal cyst   . PONV (postoperative nausea and vomiting)    Patient Active Problem List   Diagnosis Date Noted  . Chronic pelvic pain in female 09/09/2016  . Endometriosis determined by laparoscopy 09/09/2016  . Anxiety disorder 12/05/2015  . PTSD (post-traumatic stress disorder) 11/10/2015  . Allergic rhinitis 05/10/2008  . Asthma, exogenous 05/10/2008   Past Surgical History:  Procedure Laterality Date  . COLONOSCOPY WITH ESOPHAGOGASTRODUODENOSCOPY (EGD)    . LAPAROSCOPY N/A 09/09/2016   Procedure: LAPAROSCOPY DIAGNOSTIC, FULGERATION OF ENDOMETRIOSIS;  Surgeon: Conard Novak, MD;  Location: [facility];  Service: Gynecology;  Laterality: N/A;  . PILONIDAL CYST EXCISION N/A 04/10/2015   Procedure: CYST EXCISION PILONIDAL EXTENSIVE;  Surgeon: Kieth Brightly, MD;  Location: [facility];  Service: General;  Laterality: N/A;   Family History  Problem Relation Age of Onset  . Hyperlipidemia Father   . GER disease Father    Allergies  Allergen Reactions   . Aripiprazole Nausea And Vomiting  . Vicodin [Hydrocodone-Acetaminophen] Nausea And Vomiting   No current outpatient medications on file.  Review of Systems  Constitutional: Positive for appetite change and fatigue. Negative for activity change, chills, diaphoresis, fever and unexpected weight change.  HENT: Negative.   Eyes: Negative.   Respiratory: Negative.   Cardiovascular: Negative.   Gastrointestinal: Positive for abdominal pain, nausea and vomiting. Negative for abdominal distention, anal bleeding, blood in stool, constipation, diarrhea and rectal pain.  Endocrine: Negative.   Genitourinary: Positive for pelvic pain and vaginal bleeding. Negative for decreased urine volume, difficulty urinating, dyspareunia, dysuria, enuresis, flank pain, frequency, genital sores, hematuria, menstrual problem, urgency, vaginal discharge and vaginal pain.  Musculoskeletal: Negative.   Skin: Negative.   Allergic/Immunologic: Negative.   Neurological: Negative.   Hematological: Negative.   Psychiatric/Behavioral: Negative.    Social History   Tobacco Use  . Smoking status: Never Smoker  . Smokeless tobacco: Never Used  Substance Use Topics  . Alcohol use: Yes    Alcohol/week: 0.0 standard drinks    Comment: drinks on friday nights   Objective:   BP 106/78 (BP Location: Right Arm, Patient Position: Sitting, Cuff Size: Normal)   Pulse 80   Temp 97.8 F (36.6 C) (Oral)   Ht 5\' 2"  (1.575 m)   Wt 166 lb 6.4 oz (75.5 kg)   SpO2 99%   BMI 30.43 kg/m  Wt Readings from Last 3 Encounters:  08/08/18 166 lb 6.4 oz (75.5 kg)  03/27/18 164 lb (74.4 kg)  12/23/17 160 lb (72.6 kg)   Vitals:   08/08/18 0826  BP: 106/78  Pulse: 80  Temp: 97.8 F (36.6 C)  TempSrc: Oral  SpO2: 99%  Weight: 166 lb 6.4 oz (75.5 kg)  Height: 5\' 2"  (1.575 m)   Physical Exam  Constitutional: She is oriented to person, place, and time. She appears well-developed and well-nourished. No distress.  HENT:   Head: Normocephalic and atraumatic.  Right Ear: Hearing normal.  Left Ear: Hearing normal.  Nose: Nose normal.  Mouth/Throat: Oropharynx is clear and moist.  Eyes: Conjunctivae, EOM and lids are normal. Right eye exhibits no discharge. Left eye exhibits no discharge. No scleral icterus.  Cardiovascular: Normal rate and normal heart sounds.  Pulmonary/Chest: Effort normal. No respiratory distress.  Abdominal: Soft. Normal appearance and bowel sounds are normal. There is no hepatosplenomegaly or splenomegaly. There is no tenderness. There is no rebound, no guarding and no CVA tenderness.  Musculoskeletal: Normal range of motion.  Neurological: She is alert and oriented to person, place, and time.  Skin: Skin is intact. No lesion and no rash noted.  Psychiatric: She has a normal mood and affect. Her speech is normal and behavior is normal. Thought content normal.      Assessment & Plan:     1. Nausea and vomiting in adult Having nausea each morning and vomiting since she pulled out her IUD on 07-22-18. Thinks this is either a pregnancy or recurrence of her past gastritis. Has vomiting after certain foods or alcohol consumption. No vomiting if she eats vegetables or hamburgers. Has gained 6 lbs in the past 6-7 months. LMP 07-22-18 through 07-28-18 with a little spotting on 07-04-18. Occasionally some lower abdominal discomfort - "not cramps like past endometriosis". Will check labs and may use Zofran-ODT 4 mg TID prn.  Recheck pending lab reports. May need follow up with gastroenterologist. - CBC with Differential/Platelet - Comprehensive metabolic panel - hCG, serum, qualitative  2. Amenorrhea Heavy unprotected sex since she pulled out her IUD on 07-22-18. Mild menses 07-24-18 through 07-28-18. Urine test negative. Very concerned about possible pregnancy with nausea and vomiting in the mornings. Has gained 6 lbs since 01-10-18. Will check serum HCG for confirmation. - POCT urine pregnancy - hCG,  serum, qualitative  3. Hx of gastritis History of H.pylori infection with gastritis documented on upper endoscopy in 2017 and was treated with triple antibiotic therapy. Recommend recheck breath test. Denies hematemesis, melena or hematochezia. No constipation or diarrhea. Should use Prilosec or Pepcid for acid reduction. Follow up pending lab reports. - CBC with Differential/Platelet - Comprehensive metabolic panel - H. pylori breath test       Dortha Kern, PA  [facility]

## 2018-08-09 ENCOUNTER — Telehealth: Payer: Self-pay | Admitting: Physician Assistant

## 2018-08-09 LAB — CBC WITH DIFFERENTIAL/PLATELET
BASOS ABS: 0 10*3/uL (ref 0.0–0.2)
Basos: 1 %
EOS (ABSOLUTE): 0.1 10*3/uL (ref 0.0–0.4)
Eos: 2 %
HEMOGLOBIN: 13.4 g/dL (ref 11.1–15.9)
Hematocrit: 39.1 % (ref 34.0–46.6)
IMMATURE GRANS (ABS): 0 10*3/uL (ref 0.0–0.1)
Immature Granulocytes: 1 %
LYMPHS: 27 %
Lymphocytes Absolute: 1.5 10*3/uL (ref 0.7–3.1)
MCH: 30.1 pg (ref 26.6–33.0)
MCHC: 34.3 g/dL (ref 31.5–35.7)
MCV: 88 fL (ref 79–97)
MONOCYTES: 7 %
Monocytes Absolute: 0.4 10*3/uL (ref 0.1–0.9)
NEUTROS PCT: 62 %
Neutrophils Absolute: 3.6 10*3/uL (ref 1.4–7.0)
PLATELETS: 325 10*3/uL (ref 150–450)
RBC: 4.45 x10E6/uL (ref 3.77–5.28)
RDW: 12.1 % — ABNORMAL LOW (ref 12.3–15.4)
WBC: 5.7 10*3/uL (ref 3.4–10.8)

## 2018-08-09 LAB — COMPREHENSIVE METABOLIC PANEL
ALBUMIN: 4.5 g/dL (ref 3.5–5.5)
ALK PHOS: 38 IU/L — AB (ref 39–117)
ALT: 14 IU/L (ref 0–32)
AST: 16 IU/L (ref 0–40)
Albumin/Globulin Ratio: 1.8 (ref 1.2–2.2)
BILIRUBIN TOTAL: 0.7 mg/dL (ref 0.0–1.2)
BUN / CREAT RATIO: 15 (ref 9–23)
BUN: 10 mg/dL (ref 6–20)
CHLORIDE: 102 mmol/L (ref 96–106)
CO2: 22 mmol/L (ref 20–29)
CREATININE: 0.67 mg/dL (ref 0.57–1.00)
Calcium: 9.6 mg/dL (ref 8.7–10.2)
GFR calc non Af Amer: 126 mL/min/{1.73_m2} (ref >59)
GFR, EST AFRICAN AMERICAN: 145 mL/min/{1.73_m2} (ref >59)
Globulin, Total: 2.5 g/dL (ref 1.5–4.5)
Glucose: 83 mg/dL (ref 65–99)
Potassium: 4.4 mmol/L (ref 3.5–5.2)
Sodium: 139 mmol/L (ref 134–144)
TOTAL PROTEIN: 7 g/dL (ref 6.0–8.5)

## 2018-08-09 LAB — HCG, SERUM, QUALITATIVE: HCG, BETA SUBUNIT, QUAL, SERUM: NEGATIVE m[IU]/mL (ref <6)

## 2018-08-09 NOTE — Telephone Encounter (Signed)
Pt needing a call back regarding labs and breath test. Please call pt do discuss asap.   Thanks, [employer]

## 2018-08-10 LAB — H. PYLORI BREATH TEST: H PYLORI BREATH TEST: NEGATIVE

## 2018-10-06 ENCOUNTER — Ambulatory Visit: Payer: Medicaid Other | Admitting: Obstetrics and Gynecology

## 2018-10-25 NOTE — L&D Delivery Note (Signed)
Date of delivery: 07/20/2019 Estimated Date of Delivery: 07/28/19 Patient's last menstrual period was 09/28/2018. EGA: [redacted]w[redacted]d  Delivery Note At 3:29 PM a viable female was delivered via Vaginal, Spontaneous (Presentation: OA;  LOA).  APGAR: 9, 9; weight: 6#11oz.   Placenta status: spontaneous, intact.  Cord:  with the following complications: none.  Cord pH: NA  Mom pushed well to deliver a viable female infant.  The head with left nuchal hand followed by posterior shoulder and then anterior shoulder, which delivered without difficulty, and the rest of the body.  No nuchal cord noted.  Baby to mom's chest.  Cord clamped and cut after 5 min delay.  No cord blood obtained.  Placenta delivered spontaneously, intact, with a 3-vessel cord.   All counts correct.  Hemostasis obtained with IV pitocin and fundal massage.   Anesthesia:  none Episiotomy: None Lacerations: None Suture Repair: NA Est. Blood Loss (mL):  300  Mom to postpartum.  Baby to Couplet care / Skin to Skin.  Rod Can, CNM 07/20/2019, 4:19 PM

## 2018-11-10 NOTE — Progress Notes (Deleted)
       Patient: Madison Dixon Female    DOB: Oct 17, 1996   22 y.o.   MRN: [id number] Visit Date: 11/10/2018  Today's Provider: Margaretann Loveless, PA-C   No chief complaint on file.  Subjective:     HPI  Allergies  Allergen Reactions  . Aripiprazole Nausea And Vomiting  . Vicodin [Hydrocodone-Acetaminophen] Nausea And Vomiting     Current Outpatient Medications:  .  ondansetron (ZOFRAN-ODT) 4 MG disintegrating tablet, Take 1 tablet (4 mg total) by mouth every 8 (eight) hours as needed for nausea or vomiting., Disp: 20 tablet, Rfl: 0  Review of Systems  Constitutional: Negative for appetite change, chills and fatigue.  Respiratory: Negative for chest tightness and shortness of breath.   Cardiovascular: Negative for palpitations.  Gastrointestinal: Negative for abdominal pain, nausea and vomiting.  Genitourinary: Positive for menstrual problem.  Neurological: Negative for dizziness.    Social History   Tobacco Use  . Smoking status: Never Smoker  . Smokeless tobacco: Never Used  Substance Use Topics  . Alcohol use: Yes    Alcohol/week: 0.0 standard drinks    Comment: drinks on friday nights      Objective:   There were no vitals taken for this visit. There were no vitals filed for this visit.   Physical Exam      Assessment & Plan        Margaretann Loveless, PA-C  [facility]

## 2018-11-13 ENCOUNTER — Ambulatory Visit: Payer: Self-pay | Admitting: Physician Assistant

## 2018-11-21 ENCOUNTER — Ambulatory Visit (INDEPENDENT_AMBULATORY_CARE_PROVIDER_SITE_OTHER): Payer: Managed Care, Other (non HMO) | Admitting: Obstetrics and Gynecology

## 2018-11-21 ENCOUNTER — Encounter: Payer: Self-pay | Admitting: Obstetrics and Gynecology

## 2018-11-21 VITALS — BP 118/74 | Ht 62.0 in | Wt 168.0 lb

## 2018-11-21 DIAGNOSIS — Z3401 Encounter for supervision of normal first pregnancy, first trimester: Secondary | ICD-10-CM

## 2018-11-21 DIAGNOSIS — N926 Irregular menstruation, unspecified: Secondary | ICD-10-CM

## 2018-11-21 NOTE — Progress Notes (Signed)
Patient presents with positive pregnancy test. She made her appointment originally due to pelvic pain.   However, since she is pregnant, we will begin prenatal care. She was instructed to take a PNV, eat correctly and avoid foods unsafe in pregnancy, avoid EtOH, smoking, drugs, etc.  Will get a dating/viability u/s and NOB at next possible opportunity.  Thomasene Mohair, MD, [facility], [facility] 11/21/2018 1:09 PM

## 2018-11-30 ENCOUNTER — Encounter: Payer: Self-pay | Admitting: Obstetrics and Gynecology

## 2018-11-30 ENCOUNTER — Ambulatory Visit (INDEPENDENT_AMBULATORY_CARE_PROVIDER_SITE_OTHER): Payer: Managed Care, Other (non HMO) | Admitting: Obstetrics and Gynecology

## 2018-11-30 ENCOUNTER — Other Ambulatory Visit: Payer: Self-pay | Admitting: Obstetrics and Gynecology

## 2018-11-30 ENCOUNTER — Ambulatory Visit (INDEPENDENT_AMBULATORY_CARE_PROVIDER_SITE_OTHER): Payer: Managed Care, Other (non HMO)

## 2018-11-30 VITALS — BP 122/74 | Ht 62.0 in | Wt 168.0 lb

## 2018-11-30 DIAGNOSIS — Z3401 Encounter for supervision of normal first pregnancy, first trimester: Secondary | ICD-10-CM

## 2018-11-30 DIAGNOSIS — N8311 Corpus luteum cyst of right ovary: Secondary | ICD-10-CM | POA: Diagnosis not present

## 2018-11-30 DIAGNOSIS — Z3A01 Less than 8 weeks gestation of pregnancy: Secondary | ICD-10-CM | POA: Diagnosis not present

## 2018-11-30 DIAGNOSIS — N926 Irregular menstruation, unspecified: Secondary | ICD-10-CM

## 2018-11-30 DIAGNOSIS — O3481 Maternal care for other abnormalities of pelvic organs, first trimester: Secondary | ICD-10-CM

## 2018-11-30 NOTE — Progress Notes (Signed)
Gynecology Ultrasound Follow Up   Chief Complaint  Patient presents with  . Follow-up  Pregnancy dating/viability ultrasound   History of Present Illness: Patient is a 23 y.o. female who presents today for ultrasound evaluation of the above .  Ultrasound demonstrates the following findings Adnexa: no masses seen  Uterus: Gestational sac within the uterus with MSD 12.1 mm, consistent with [redacted]w[redacted]d gestation.   NO fetal pole noted. Yolk sac visualized. Small amount of free fluid.   No symptoms today. No vaginal bleeding. She is taking a PNV  Past Medical History:  Diagnosis Date  . Anxiety   . Asthma    sports induced  . Bipolar disorder (HCC)   . Chronic pelvic pain in female 09/09/2016  . Depression   . Endometriosis determined by laparoscopy 09/09/2016  . Ovarian cyst   . Pilonidal cyst   . PONV (postoperative nausea and vomiting)     Past Surgical History:  Procedure Laterality Date  . COLONOSCOPY WITH ESOPHAGOGASTRODUODENOSCOPY (EGD)    . LAPAROSCOPY N/A 09/09/2016   Procedure: LAPAROSCOPY DIAGNOSTIC, FULGERATION OF ENDOMETRIOSIS;  Surgeon: Conard Novak, MD;  Location: [facility] ORS;  Service: Gynecology;  Laterality: N/A;  . PILONIDAL CYST EXCISION N/A 04/10/2015   Procedure: CYST EXCISION PILONIDAL EXTENSIVE;  Surgeon: Kieth Brightly, MD;  Location: [facility] ORS;  Service: General;  Laterality: N/A;    Family History  Problem Relation Age of Onset  . Hyperlipidemia Father   . GER disease Father     Social History   Socioeconomic History  . Marital status: Single    Spouse name: Not on file  . Number of children: Not on file  . Years of education: Not on file  . Highest education level: Not on file  Occupational History  . Not on file  Social Needs  . Financial resource strain: Not on file  . Food insecurity:    Worry: Not on file    Inability: Not on file  . Transportation needs:    Medical: Not on file    Non-medical: Not on file  Tobacco Use    . Smoking status: Never Smoker  . Smokeless tobacco: Never Used  Substance and Sexual Activity  . Alcohol use: Yes    Alcohol/week: 0.0 standard drinks    Comment: drinks on friday nights  . Drug use: Not Currently    Types: Marijuana  . Sexual activity: Yes    Birth control/protection: None  Lifestyle  . Physical activity:    Days per week: Not on file    Minutes per session: Not on file  . Stress: Not on file  Relationships  . Social connections:    Talks on phone: Not on file    Gets together: Not on file    Attends religious service: Not on file    Active member of club or organization: Not on file    Attends meetings of clubs or organizations: Not on file    Relationship status: Not on file  . Intimate partner violence:    Fear of current or ex partner: Not on file    Emotionally abused: Not on file    Physically abused: Not on file    Forced sexual activity: Not on file  Other Topics Concern  . Not on file  Social History Narrative  . Not on file    Allergies  Allergen Reactions  . Aripiprazole Nausea And Vomiting  . Vicodin [Hydrocodone-Acetaminophen] Nausea And Vomiting    Prior to Admission  medications   Medication Sig Start Date End Date Taking? Authorizing Provider  ondansetron (ZOFRAN-ODT) 4 MG disintegrating tablet Take 1 tablet (4 mg total) by mouth every 8 (eight) hours as needed for nausea or vomiting. Patient not taking: Reported on 11/21/2018 08/08/18   Chrismon, Jodell Ciproennis E, PA    Physical Exam BP 122/74   Ht 5\' 2"  (1.575 m)   Wt 168 lb (76.2 kg)   BMI 30.73 kg/m    General: NAD HEENT: normocephalic, anicteric Pulmonary: No increased work of breathing Extremities: no edema, erythema, or tenderness Neurologic: Grossly intact, normal gait Psychiatric: mood appropriate, affect full  Imaging Results [place] Ob Transvaginal  Result Date: 11/30/2018 Patient Name: Madison Dixon DOB: [birth year] MRN: [id number] ULTRASOUND REPORT Location: [facility] Date of Service: 11/30/2018 Indications:dating Findings: Intrauterine gestational sac measuring 12.651mm, 5832w0d. This does not correlate with clinically established gestational age of 7121w0d. No fetal pole seen. Yolk sac is visualized and appears normal. Right Ovary is normal in appearance. Left Ovary is normal appearance. Corpus luteal cyst:  Right ovary Survey of the adnexa demonstrates no adnexal masses. Small amount of complex fluid seen near right ovary. Impression: 1. Intrauterine gestational sac measuring 12.621mm, 7432w0d. No fetal pole seen. Recommendations: 1.Clinical correlation with the patient's History and Physical Exam. 2. Follow up ultrasound in 11 or greater days.Darlina Guys* Abby M Clarke, RDMS RVT *Criteria from the Society of Radiologists in Ultrasound Multispecialty Consensus Conference on Early First Trimester Diagnosis of Miscarriage and Exclusion of a Viable Intrauterine Pregnancy, October 2012. The ultrasound images and findings were reviewed by me and I agree with the above report. Thomasene MohairStephen Nasteho Glantz, MD, Merlinda FrederickFACOG [facility], [facility] 11/30/2018 9:57 AM     Assessment: 23 y.o. G0P0000  1. Encounter for supervision of normal first pregnancy in first trimester   2. [redacted] weeks gestation of pregnancy      Plan: Problem List Items Addressed This Visit    None    Visit Diagnoses    Encounter for supervision of normal first pregnancy in first trimester    -  Primary   Relevant Orders   US OB Comp Less 14 Wks   [redacted] weeks gestation of pregnancy       Relevant Orders   US OB Comp Less 14 Wks      Discussed ultrasound findings today.  Based on findings today and according to recommended practices, will repeat the ultrasound in 11 or greater days to verify viability.  No concerning findings on ultrasound today and no concerning symptoms from the patient.  15 minutes spent in face to face discussion with > 50% spent in counseling,management, and coordination of care of her early  pregnancy.  Thomasene MohairStephen Firman Petrow, MD, Merlinda FrederickFACOG [facility], [facility] 11/30/2018 10:13 AM

## 2018-12-11 ENCOUNTER — Encounter: Payer: Self-pay | Admitting: Advanced Practice Midwife

## 2018-12-11 ENCOUNTER — Ambulatory Visit (INDEPENDENT_AMBULATORY_CARE_PROVIDER_SITE_OTHER): Payer: Managed Care, Other (non HMO) | Admitting: Advanced Practice Midwife

## 2018-12-11 ENCOUNTER — Other Ambulatory Visit: Payer: Self-pay | Admitting: Obstetrics and Gynecology

## 2018-12-11 ENCOUNTER — Ambulatory Visit (INDEPENDENT_AMBULATORY_CARE_PROVIDER_SITE_OTHER): Payer: Managed Care, Other (non HMO)

## 2018-12-11 VITALS — BP 112/70 | Wt 171.0 lb

## 2018-12-11 DIAGNOSIS — N8311 Corpus luteum cyst of right ovary: Secondary | ICD-10-CM

## 2018-12-11 DIAGNOSIS — O3481 Maternal care for other abnormalities of pelvic organs, first trimester: Secondary | ICD-10-CM

## 2018-12-11 DIAGNOSIS — Z3A01 Less than 8 weeks gestation of pregnancy: Secondary | ICD-10-CM | POA: Diagnosis not present

## 2018-12-11 DIAGNOSIS — O3680X0 Pregnancy with inconclusive fetal viability, not applicable or unspecified: Secondary | ICD-10-CM | POA: Diagnosis not present

## 2018-12-11 DIAGNOSIS — Z3401 Encounter for supervision of normal first pregnancy, first trimester: Secondary | ICD-10-CM

## 2018-12-11 DIAGNOSIS — Z09 Encounter for follow-up examination after completed treatment for conditions other than malignant neoplasm: Secondary | ICD-10-CM

## 2018-12-11 NOTE — Progress Notes (Signed)
Patient ID: YAKIA LACLAIRE, female   DOB: 1996-09-25, 23 y.o.   MRN: [id number]  Reason for Visit: Follow-up (Dating scan)    Subjective:     HPI  YATZIL SAMUDIO is a 23 y.o. female in the office today for a dating/viability scan. She was seen 11 days ago for dating scan and no fetal pole was seen at that time. She has had some nausea and vomiting. She denies breast tenderness or fatigue. She has had some pain in her back but she denies cramping or bleeding at this time.   Based on the ultrasound report today, we have made a plan for 1 more viability scan in about a week and a half.   Past Medical History:  Diagnosis Date  . Anxiety   . Asthma    sports induced  . Bipolar disorder (HCC)   . Chronic pelvic pain in female 09/09/2016  . Depression   . Endometriosis determined by laparoscopy 09/09/2016  . Ovarian cyst   . Pilonidal cyst   . PONV (postoperative nausea and vomiting)    Family History  Problem Relation Age of Onset  . Hyperlipidemia Father   . GER disease Father    Past Surgical History:  Procedure Laterality Date  . COLONOSCOPY WITH ESOPHAGOGASTRODUODENOSCOPY (EGD)    . LAPAROSCOPY N/A 09/09/2016   Procedure: LAPAROSCOPY DIAGNOSTIC, FULGERATION OF ENDOMETRIOSIS;  Surgeon: Conard Novak, MD;  Location: [facility];  Service: Gynecology;  Laterality: N/A;  . PILONIDAL CYST EXCISION N/A 04/10/2015   Procedure: CYST EXCISION PILONIDAL EXTENSIVE;  Surgeon: Kieth Brightly, MD;  Location: [facility];  Service: General;  Laterality: N/A;    Short Social History:  Social History   Tobacco Use  . Smoking status: Never Smoker  . Smokeless tobacco: Never Used  Substance Use Topics  . Alcohol use: Yes    Alcohol/week: 0.0 standard drinks    Comment: drinks on friday nights    Allergies  Allergen Reactions  . Aripiprazole Nausea And Vomiting  . Vicodin [Hydrocodone-Acetaminophen] Nausea And Vomiting    No current outpatient medications on file.    No current facility-administered medications for this visit.     Review of Systems  Constitutional:  Constitutional negative. HENT: HENT negative.  Eyes: Eyes negative.  Respiratory: Respiratory negative.  Cardiovascular: Cardiovascular negative.  GI: Positive for nausea and vomiting.  GU: Genitourinary negative. Musculoskeletal: Positive for back pain.  Skin: Skin negative.  Neurological: Neurological negative. Hematologic: Hematologic/lymphatic negative.  Psychiatric: Psychiatric negative.        Objective:  Objective   Vitals:   12/11/18 1409  BP: 112/70  Weight: 171 lb (77.6 kg)   Body mass index is 31.28 kg/m. Constitutional: Well nourished, well developed female in no acute distress.  HEENT: normal Skin: Warm and dry.  Cardiovascular: Regular rate and rhythm.   Respiratory: Clear to auscultation bilateral. Normal respiratory effort Back: no CVAT Psych: Alert and Oriented x3. No memory deficits. Normal mood and affect.    Data: Patient Name: ADELENE HEHIR DOB: 1996-05-12 MRN: [id number] ULTRASOUND REPORT  Location: [facility] Date of Service: 12/11/2018   Indications:dating Findings:  Mason Jim intrauterine pregnancy is visualized with a CRL consistent with [redacted]w[redacted]d gestation, giving an (U/S) EDD of 07/28/19. The (U/S) EDD is not consistent with the clinically established EDD of 07/05/19.  FHR: 138 BPM CRL measurement: 11.3 mm Yolk sac is visualized and appears flattened. Amnion: visualized and appears normal  Echogenic material seen throughout gestational sac.   Right  Ovary is normal in appearance. Left Ovary is normal appearance. Corpus luteal cyst:  Right ovary Survey of the adnexa demonstrates no adnexal masses. There is no free peritoneal fluid in the cul de sac.  Impression: 1. [redacted]w[redacted]d Viable Singleton Intrauterine pregnancy by U/S. 2. (U/S) EDD is not consistent with Clinically established EDD. [place] EDD of 07/28/19. 3. Flattened  appearance of the yolk sac and echogenic material seen throughout the gestational sac.  Recommendations: 1.Clinical correlation with the patient's History and Physical Exam.   Darlina Guys, RDMS RVT     Assessment/Plan:    23 yo G1P0 at with IUP at [redacted]w[redacted]d, yolk sac appears flattened  Follow up viability scan in 1.5 weeks  Tresea Mall CNM [facility], [facility]

## 2018-12-11 NOTE — Progress Notes (Signed)
Dating scan today.  

## 2018-12-14 ENCOUNTER — Other Ambulatory Visit: Payer: Self-pay | Admitting: Obstetrics and Gynecology

## 2018-12-14 DIAGNOSIS — Z0189 Encounter for other specified special examinations: Secondary | ICD-10-CM

## 2018-12-19 ENCOUNTER — Other Ambulatory Visit (HOSPITAL_COMMUNITY)
Admission: RE | Admit: 2018-12-19 | Discharge: 2018-12-19 | Disposition: A | Discharge status: Home / Self Care | Payer: Medicaid Other | Source: Ambulatory Visit | Attending: Obstetrics and Gynecology | Admitting: Obstetrics and Gynecology

## 2018-12-19 ENCOUNTER — Ambulatory Visit (INDEPENDENT_AMBULATORY_CARE_PROVIDER_SITE_OTHER): Payer: Managed Care, Other (non HMO)

## 2018-12-19 ENCOUNTER — Encounter: Payer: Self-pay | Admitting: Obstetrics and Gynecology

## 2018-12-19 ENCOUNTER — Ambulatory Visit (INDEPENDENT_AMBULATORY_CARE_PROVIDER_SITE_OTHER): Payer: Managed Care, Other (non HMO) | Admitting: Obstetrics and Gynecology

## 2018-12-19 ENCOUNTER — Other Ambulatory Visit: Payer: Self-pay | Admitting: Obstetrics and Gynecology

## 2018-12-19 VITALS — BP 118/76 | Wt 170.0 lb

## 2018-12-19 DIAGNOSIS — Z0189 Encounter for other specified special examinations: Secondary | ICD-10-CM

## 2018-12-19 DIAGNOSIS — Z362 Encounter for other antenatal screening follow-up: Secondary | ICD-10-CM | POA: Diagnosis not present

## 2018-12-19 DIAGNOSIS — Z3401 Encounter for supervision of normal first pregnancy, first trimester: Secondary | ICD-10-CM | POA: Insufficient documentation

## 2018-12-19 DIAGNOSIS — Z3A01 Less than 8 weeks gestation of pregnancy: Secondary | ICD-10-CM

## 2018-12-19 DIAGNOSIS — Z3400 Encounter for supervision of normal first pregnancy, unspecified trimester: Secondary | ICD-10-CM | POA: Insufficient documentation

## 2018-12-19 DIAGNOSIS — Z3A08 8 weeks gestation of pregnancy: Secondary | ICD-10-CM

## 2018-12-19 NOTE — Progress Notes (Signed)
New Obstetric Patient H&P   Chief Complaint: "Desires prenatal care"  History of Present Illness: Patient is a 23 y.o. G1P0000 Not Hispanic or Latino female, Based on a 7 week ultrasound her EDD is Estimated Date of Delivery: 07/28/19 and her EGA is [redacted]w[redacted]d. She has never had a pap smear.    Since her LMP she claims she has experienced some nausea and urinary frequency. She denies vaginal bleeding. Her past medical history is noncontributory.   Since her LMP, she admits to the use of tobacco products  no She claims she has gained 6 pounds since the start of her pregnancy.  There are cats in the home in the home  no  She admits close contact with children on a regular basis  yes  She has had chicken pox in the past no She has had Tuberculosis exposures, symptoms, or previously tested positive for TB   no Current or past history of domestic violence. no  Genetic Screening/Teratology Counseling: (Includes patient, baby's father, or anyone in either family with:)   1. Patient's age >/= 58 at [facility]  no 2. Thalassemia ([place], [place], Mediterranean, or Asian background): MCV<80  no 3. Neural tube defect (meningomyelocele, spina bifida, anencephaly)  no 4. Congenital heart defect  no  5. Down syndrome  no 6. Tay-Sachs (Jewish, [place]  no 7. Canavan's Disease  no 8. Sickle cell disease or trait (African)  no  9. Hemophilia or other blood disorders  no  10. Muscular dystrophy  no  11. Cystic fibrosis  no  12. Huntington's Chorea  no  13. Mental retardation/autism  no 14. Other inherited genetic or chromosomal disorder  no 15. Maternal metabolic disorder (DM, PKU, etc)  no 16. Patient or FOB with a child with a birth defect not listed above no  16a. Patient or FOB with a birth defect themselves no 17. Recurrent pregnancy loss, or stillbirth  no  18. Any medications since LMP other than prenatal vitamins (include vitamins, supplements, OTC meds, drugs, alcohol)  no 19. Any other  genetic/environmental exposure to discuss  no  Infection History:   1. Lives with someone with TB or TB exposed  no  2. Patient or partner has history of genital herpes  no 3. Rash or viral illness since LMP  no 4. History of STI (GC, CT, HPV, syphilis, HIV)  no 5. History of recent travel :  no  Other pertinent information:  no   Ultrasound today shows appropriate interval growth.  Yolk sac still appears "flat."  Question of enlarged placenta for gestational age.  Review of Systems:10 point review of systems negative unless otherwise noted in HPI  Past Medical History:  Diagnosis Date  . Anxiety   . Asthma    sports induced  . Bipolar disorder (HCC)   . Chronic pelvic pain in female 09/09/2016  . Depression   . Endometriosis determined by laparoscopy 09/09/2016  . Ovarian cyst   . Pilonidal cyst   . PONV (postoperative nausea and vomiting)     Past Surgical History:  Procedure Laterality Date  . COLONOSCOPY WITH ESOPHAGOGASTRODUODENOSCOPY (EGD)    . LAPAROSCOPY N/A 09/09/2016   Procedure: LAPAROSCOPY DIAGNOSTIC, FULGERATION OF ENDOMETRIOSIS;  Surgeon: Conard Novak, MD;  Location: [facility] ORS;  Service: Gynecology;  Laterality: N/A;  . PILONIDAL CYST EXCISION N/A 04/10/2015   Procedure: CYST EXCISION PILONIDAL EXTENSIVE;  Surgeon: Kieth Brightly, MD;  Location: [facility] OR[facility];  Service: General;  Laterality: N/A;   Gynecologic History: Patient's  last menstrual period was 09/28/2018.  Obstetric History: G1P0000  Family History  Problem Relation Age of Onset  . Hyperlipidemia Father   . GER disease Father     Social History   Socioeconomic History  . Marital status: Single    Spouse name: Not on file  . Number of children: Not on file  . Years of education: Not on file  . Highest education level: Not on file  Occupational History  . Not on file  Social Needs  . Financial resource strain: Not on file  . Food insecurity:    Worry: Not on file    Inability:  Not on file  . Transportation needs:    Medical: Not on file    Non-medical: Not on file  Tobacco Use  . Smoking status: Never Smoker  . Smokeless tobacco: Never Used  Substance and Sexual Activity  . Alcohol use: Yes    Alcohol/week: 0.0 standard drinks    Comment: drinks on friday nights  . Drug use: Not Currently    Types: Marijuana  . Sexual activity: Yes    Birth control/protection: None  Lifestyle  . Physical activity:    Days per week: Not on file    Minutes per session: Not on file  . Stress: Not on file  Relationships  . Social connections:    Talks on phone: Not on file    Gets together: Not on file    Attends religious service: Not on file    Active member of club or organization: Not on file    Attends meetings of clubs or organizations: Not on file    Relationship status: Not on file  . Intimate partner violence:    Fear of current or ex partner: Not on file    Emotionally abused: Not on file    Physically abused: Not on file    Forced sexual activity: Not on file  Other Topics Concern  . Not on file  Social History Narrative  . Not on file    Allergies  Allergen Reactions  . Aripiprazole Nausea And Vomiting  . Vicodin [Hydrocodone-Acetaminophen] Nausea And Vomiting    Prior to Admission medications   PNV    Physical Exam BP 118/76   Wt 170 lb (77.1 kg)   LMP 09/28/2018   BMI 31.09 kg/m   Physical Exam Exam conducted with a chaperone present.  Constitutional:      General: She is not in acute distress.    Appearance: Normal appearance. She is well-developed.  HENT:     Head: Normocephalic and atraumatic.  Eyes:     General: No scleral icterus.    Conjunctiva/sclera: Conjunctivae normal.  Neck:     Musculoskeletal: Normal range of motion and neck supple.     Thyroid: No thyromegaly.  Cardiovascular:     Rate and Rhythm: Normal rate and regular rhythm.     Heart sounds: Normal heart sounds. No murmur. No friction rub. No gallop.    Pulmonary:     Effort: Pulmonary effort is normal.     Breath sounds: Normal breath sounds. No wheezing.  Abdominal:     General: There is no distension.     Palpations: Abdomen is soft. There is no mass.     Tenderness: There is no abdominal tenderness. There is no guarding or rebound.     Hernia: No hernia is present. There is no hernia in the right inguinal area or left inguinal area.  Genitourinary:  General: Normal vulva.     Exam position: Supine.     Labia:        Right: No rash, tenderness or lesion.        Left: No rash, tenderness or lesion.      Cervix: Normal.     Uterus: Enlarged. Not deviated and not tender.      Adnexa: Right adnexa normal and left adnexa normal.  Musculoskeletal: Normal range of motion.  Skin:    General: Skin is warm and dry.     Findings: No rash.  Neurological:     General: No focal deficit present.     Mental Status: She is alert and oriented to person, place, and time.     Cranial Nerves: No cranial nerve deficit.  Psychiatric:        Mood and Affect: Mood normal.        Behavior: Behavior normal.        Judgment: Judgment normal.      Female Chaperone present during breast and/or pelvic exam.  Imaging Results US Ob Transvaginal  Result Date: 12/19/2018 Patient Name: Madison Dixon DOB: January 20, 1996 MRN: [id number] ULTRASOUND REPORT Location: [facility] Date of Service: 12/19/2018 Indications: Follow up viability Findings: Singleton intrauterine pregnancy is visualized with a CRL consistent with [redacted]w[redacted]d gestation, giving an (U/S) EDD of 07/28/19. The (U/S) EDD is consistent with the clinically established EDD of 07/28/19. FHR: 169 BPM CRL measurement: 19.3 mm Yolk sac still appears flattened; however, placenta with cord insert is seen today. Early anatomy is normal. Amnion: visualized and appears normal Echogenic material again seen throughout gestational sac. Right Ovary is normal in appearance. Left Ovary is normal appearance. Survey of  the adnexa demonstrates no adnexal masses. Small amount of free fluid seen within the cul de sac Impression: 1. [redacted]w[redacted]d Viable Singleton Intrauterine pregnancy by U/S. 2. (U/S) EDD is consistent with Clinically established EDD of 07/28/19. 3. Yolk sac still appears flattened; however, placenta seen with cord insert. 4. Echogenic material again seen throughout gestational sac.  Recommendations: 1.Clinical correlation with the patient's History and Physical Exam. 2. Enlarged placenta noted at gestational age.  Recommend clinical correlation (consider beta hCG). Darlina Guys, RDMS RVT The ultrasound images and findings were reviewed by me and I agree with the above report. Thomasene Mohair, MD, [facility], [facility] 12/19/2018 10:25 AM       Assessment: 23 y.o. G1P0000 at [redacted]w[redacted]d presenting to initiate prenatal care  Plan: 1) Avoid alcoholic beverages. 2) Patient encouraged not to smoke.  3) Discontinue the use of all non-medicinal drugs and chemicals.  4) Take prenatal vitamins daily.  5) Nutrition, food safety (fish, cheese advisories, and high nitrite foods) and exercise discussed. 6) Hospital and practice style discussed with cross coverage system.  7) Genetic Screening, such as with 1st Trimester Screening, cell free fetal DNA, AFP testing, and Ultrasound, as well as with amniocentesis and CVS as appropriate, is discussed with patient. At the conclusion of today's visit patient requested genetic testing 8) Patient is asked about travel to areas at risk for the Zika virus, and counseled to avoid travel and exposure to mosquitoes or sexual partners who may have themselves been exposed to the virus. Testing is discussed, and will be ordered as appropriate.  9) Enlarged placenta. Will check quant hCG. If significantly elevated, this might raise suspicion for incomplete molar pregnancy.  Thomasene Mohair, MD 12/19/2018 11:41 AM

## 2018-12-20 LAB — RPR+RH+ABO+RUB AB+AB SCR+CB...
Antibody Screen: NEGATIVE
HIV Screen 4th Generation wRfx: NONREACTIVE
Hematocrit: 34.3 % (ref 34.0–46.6)
Hemoglobin: 12.1 g/dL (ref 11.1–15.9)
Hepatitis B Surface Ag: NEGATIVE
MCH: 31.3 pg (ref 26.6–33.0)
MCHC: 35.3 g/dL (ref 31.5–35.7)
MCV: 89 fL (ref 79–97)
Platelets: 282 10*3/uL (ref 150–450)
RBC: 3.87 x10E6/uL (ref 3.77–5.28)
RDW: 12.3 % (ref 11.7–15.4)
RPR Ser Ql: NONREACTIVE
Rh Factor: POSITIVE
Rubella Antibodies, IGG: 2.7 index (ref >0.99)
Varicella zoster IgG: <135 index — ABNORMAL LOW (ref >165)
WBC: 9.9 10*3/uL (ref 3.4–10.8)

## 2018-12-20 LAB — BETA HCG QUANT (REF LAB): hCG Quant: 135878 m[IU]/mL

## 2018-12-21 LAB — CYTOLOGY - PAP
Chlamydia: NEGATIVE
Diagnosis: NEGATIVE
Neisseria Gonorrhea: NEGATIVE

## 2018-12-22 LAB — URINE CULTURE

## 2019-01-02 ENCOUNTER — Ambulatory Visit (INDEPENDENT_AMBULATORY_CARE_PROVIDER_SITE_OTHER): Payer: Managed Care, Other (non HMO) | Admitting: Obstetrics and Gynecology

## 2019-01-02 ENCOUNTER — Encounter: Payer: Self-pay | Admitting: Obstetrics and Gynecology

## 2019-01-02 VITALS — BP 116/74 | Wt 171.0 lb

## 2019-01-02 DIAGNOSIS — Z1379 Encounter for other screening for genetic and chromosomal anomalies: Secondary | ICD-10-CM

## 2019-01-02 DIAGNOSIS — Z3401 Encounter for supervision of normal first pregnancy, first trimester: Secondary | ICD-10-CM | POA: Diagnosis not present

## 2019-01-02 DIAGNOSIS — Z3A10 10 weeks gestation of pregnancy: Secondary | ICD-10-CM

## 2019-01-02 LAB — POCT URINALYSIS DIPSTICK OB
Bilirubin, UA: NEGATIVE
POC,PROTEIN,UA: NEGATIVE

## 2019-01-02 NOTE — Patient Instructions (Signed)
First Trimester of Pregnancy  The first trimester of pregnancy is from week 1 until the end of week 13 (months 1 through 3). A week after a sperm fertilizes an egg, the egg will implant on the wall of the uterus. This embryo will begin to develop into a baby. Genes from you and your partner will form the baby. The female genes will determine whether the baby will be a boy or a girl. At 6-8 weeks, the eyes and face will be formed, and the heartbeat can be seen on ultrasound. At the end of 12 weeks, all the baby's organs will be formed.  Now that you are pregnant, you will want to do everything you can to have a healthy baby. Two of the most important things are to get good prenatal care and to follow your health care provider's instructions. Prenatal care is all the medical care you receive before the baby's birth. This care will help prevent, find, and treat any problems during the pregnancy and childbirth.  Body changes during your first trimester  Your body goes through many changes during pregnancy. The changes vary from woman to woman.   You may gain or lose a couple of pounds at first.   You may feel sick to your stomach (nauseous) and you may throw up (vomit). If the vomiting is uncontrollable, call your health care provider.   You may tire easily.   You may develop headaches that can be relieved by medicines. All medicines should be approved by your health care provider.   You may urinate more often. Painful urination may mean you have a bladder infection.   You may develop heartburn as a result of your pregnancy.   You may develop constipation because certain hormones are causing the muscles that push stool through your intestines to slow down.   You may develop hemorrhoids or swollen veins (varicose veins).   Your breasts may begin to grow larger and become tender. Your nipples may stick out more, and the tissue that surrounds them (areola) may become darker.   Your gums may bleed and may be  sensitive to brushing and flossing.   Dark spots or blotches (chloasma, mask of pregnancy) may develop on your face. This will likely fade after the baby is born.   Your menstrual periods will stop.   You may have a loss of appetite.   You may develop cravings for certain kinds of food.   You may have changes in your emotions from day to day, such as being excited to be pregnant or being concerned that something may go wrong with the pregnancy and baby.   You may have more vivid and strange dreams.   You may have changes in your hair. These can include thickening of your hair, rapid growth, and changes in texture. Some women also have hair loss during or after pregnancy, or hair that feels dry or thin. Your hair will most likely return to normal after your baby is born.  What to expect at prenatal visits  During a routine prenatal visit:   You will be weighed to make sure you and the baby are growing normally.   Your blood pressure will be taken.   Your abdomen will be measured to track your baby's growth.   The fetal heartbeat will be listened to between weeks 10 and 14 of your pregnancy.   Test results from any previous visits will be discussed.  Your health care provider may ask you:     How you are feeling.   If you are feeling the baby move.   If you have had any abnormal symptoms, such as leaking fluid, bleeding, severe headaches, or abdominal cramping.   If you are using any tobacco products, including cigarettes, chewing tobacco, and electronic cigarettes.   If you have any questions.  Other tests that may be performed during your first trimester include:   Blood tests to find your blood type and to check for the presence of any previous infections. The tests will also be used to check for low iron levels (anemia) and protein on red blood cells (Rh antibodies). Depending on your risk factors, or if you previously had diabetes during pregnancy, you may have tests to check for high blood sugar  that affects pregnant women (gestational diabetes).   Urine tests to check for infections, diabetes, or protein in the urine.   An ultrasound to confirm the proper growth and development of the baby.   Fetal screens for spinal cord problems (spina bifida) and Down syndrome.   HIV (human immunodeficiency virus) testing. Routine prenatal testing includes screening for HIV, unless you choose not to have this test.   You may need other tests to make sure you and the baby are doing well.  Follow these instructions at home:  Medicines   Follow your health care provider's instructions regarding medicine use. Specific medicines may be either safe or unsafe to take during pregnancy.   Take a prenatal vitamin that contains at least 600 micrograms (mcg) of folic acid.   If you develop constipation, try taking a stool softener if your health care provider approves.  Eating and drinking     Eat a balanced diet that includes fresh fruits and vegetables, whole grains, good sources of protein such as meat, eggs, or tofu, and low-fat dairy. Your health care provider will help you determine the amount of weight gain that is right for you.   Avoid raw meat and uncooked cheese. These carry germs that can cause birth defects in the baby.   Eating four or five small meals rather than three large meals a day may help relieve nausea and vomiting. If you start to feel nauseous, eating a few soda crackers can be helpful. Drinking liquids between meals, instead of during meals, also seems to help ease nausea and vomiting.   Limit foods that are high in fat and processed sugars, such as fried and sweet foods.   To prevent constipation:  ? Eat foods that are high in fiber, such as fresh fruits and vegetables, whole grains, and beans.  ? Drink enough fluid to keep your urine clear or pale yellow.  Activity   Exercise only as directed by your health care provider. Most women can continue their usual exercise routine during  pregnancy. Try to exercise for 30 minutes at least 5 days a week. Exercising will help you:  ? Control your weight.  ? Stay in shape.  ? Be prepared for labor and delivery.   Experiencing pain or cramping in the lower abdomen or lower back is a good sign that you should stop exercising. Check with your health care provider before continuing with normal exercises.   Try to avoid standing for long periods of time. Move your legs often if you must stand in one place for a long time.   Avoid heavy lifting.   Wear low-heeled shoes and practice good posture.   You may continue to have sex unless your health care   provider tells you not to.  Relieving pain and discomfort   Wear a good support bra to relieve breast tenderness.   Take warm sitz baths to soothe any pain or discomfort caused by hemorrhoids. Use hemorrhoid cream if your health care provider approves.   Rest with your legs elevated if you have leg cramps or low back pain.   If you develop varicose veins in your legs, wear support hose. Elevate your feet for 15 minutes, 3-4 times a day. Limit salt in your diet.  Prenatal care   Schedule your prenatal visits by the twelfth week of pregnancy. They are usually scheduled monthly at first, then more often in the last 2 months before delivery.   Write down your questions. Take them to your prenatal visits.   Keep all your prenatal visits as told by your health care provider. This is important.  Safety   Wear your seat belt at all times when driving.   Make a list of emergency phone numbers, including numbers for family, friends, the hospital, and police and fire departments.  General instructions   Ask your health care provider for a referral to a local prenatal education class. Begin classes no later than the beginning of month 6 of your pregnancy.   Ask for help if you have counseling or nutritional needs during pregnancy. Your health care provider can offer advice or refer you to specialists for help  with various needs.   Do not use hot tubs, steam rooms, or saunas.   Do not douche or use tampons or scented sanitary pads.   Do not cross your legs for long periods of time.   Avoid cat litter boxes and soil used by cats. These carry germs that can cause birth defects in the baby and possibly loss of the fetus by miscarriage or stillbirth.   Avoid all smoking, herbs, alcohol, and medicines not prescribed by your health care provider. Chemicals in these products affect the formation and growth of the baby.   Do not use any products that contain nicotine or tobacco, such as cigarettes and e-cigarettes. If you need help quitting, ask your health care provider. You may receive counseling support and other resources to help you quit.   Schedule a dentist appointment. At home, brush your teeth with a soft toothbrush and be gentle when you floss.  Contact a health care provider if:   You have dizziness.   You have mild pelvic cramps, pelvic pressure, or nagging pain in the abdominal area.   You have persistent nausea, vomiting, or diarrhea.   You have a bad smelling vaginal discharge.   You have pain when you urinate.   You notice increased swelling in your face, hands, legs, or ankles.   You are exposed to fifth disease or chickenpox.   You are exposed to German measles (rubella) and have never had it.  Get help right away if:   You have a fever.   You are leaking fluid from your vagina.   You have spotting or bleeding from your vagina.   You have severe abdominal cramping or pain.   You have rapid weight gain or loss.   You vomit blood or material that looks like coffee grounds.   You develop a severe headache.   You have shortness of breath.   You have any kind of trauma, such as from a fall or a car accident.  Summary   The first trimester of pregnancy is from week 1 until   the end of week 13 (months 1 through 3).   Your body goes through many changes during pregnancy. The changes vary from  woman to woman.   You will have routine prenatal visits. During those visits, your health care provider will examine you, discuss any test results you may have, and talk with you about how you are feeling.  This information is not intended to replace advice given to you by your health care provider. Make sure you discuss any questions you have with your health care provider.  Document Released: 10/05/2001 Document Revised: 09/22/2016 Document Reviewed: 09/22/2016  Elsevier Interactive Patient Education  2019 [employer]

## 2019-01-02 NOTE — Progress Notes (Signed)
  Routine Prenatal Care Visit  Subjective  Madison Dixon is a 23 y.o. G1P0000 at [redacted]w[redacted]d being seen today for ongoing prenatal care.  She is currently monitored for the following issues for this low-risk pregnancy and has Allergic rhinitis; Asthma, exogenous; PTSD (post-traumatic stress disorder); Anxiety disorder; Chronic pelvic pain in female; Endometriosis determined by laparoscopy; and Encounter for supervision of normal first pregnancy in first trimester on their problem list.  ----------------------------------------------------------------------------------- Patient reports no complaints.    . Vag. Bleeding: None.  Movement: Absent. Denies leaking of fluid.  ----------------------------------------------------------------------------------- The following portions of the patient's history were reviewed and updated as appropriate: allergies, current medications, past family history, past medical history, past social history, past surgical history and problem list. Problem list updated.   Objective  Blood pressure 116/74, weight 171 lb (77.6 kg), last menstrual period 09/28/2018. Pregravid weight 165 lb (74.8 kg) Total Weight Gain 6 lb (2.722 kg) Urinalysis: Urine Protein Negative  Urine Glucose    Fetal Status: Fetal Heart Rate (bpm): 153   Movement: Absent     General:  Alert, oriented and cooperative. Patient is in no acute distress.  Skin: Skin is warm and dry. No rash noted.   Cardiovascular: Normal heart rate noted  Respiratory: Normal respiratory effort, no problems with respiration noted  Abdomen: Soft, gravid, appropriate for gestational age. Pain/Pressure: Absent     Pelvic:  Cervical exam deferred        Extremities: Normal range of motion.  Edema: None  Mental Status: Normal mood and affect. Normal behavior. Normal judgment and thought content.   Assessment   22 y.o. G1P0000 at [redacted]w[redacted]d by  07/28/2019, by Ultrasound presenting for routine prenatal visit  Plan    Pregnancy#1 Problems (from 09/28/18 to present)    Problem Noted Resolved   Encounter for supervision of normal first pregnancy in first trimester 12/19/2018 by Conard Novak, MD No   Overview Signed 01/02/2019  9:03 AM by Conard Novak, MD    Clinic [facility] Prenatal Labs  Dating  Blood type: A/Positive/-- (02/25 1218)   Genetic Screen 1 Screen:    AFP:     Quad:     NIPS: Antibody:Negative (02/25 1218)  Anatomic [place]  Rubella: 2.70 (02/25 1218) Varicella: @VZVIGG @  GTT Early:               Third trimester:  RPR: Non Reactive (02/25 1218)   Rhogam  HBsAg: Negative (02/25 1218)   TDaP vaccine                       Flu Shot: HIV: Non Reactive (02/25 1218)   Baby Food                                GBS:   Contraception  Pap:  CBB     CS/VBAC    Support Person                Preterm labor symptoms and general obstetric precautions including but not limited to vaginal bleeding, contractions, leaking of fluid and fetal movement were reviewed in detail with the patient. Please refer to After Visit Summary for other counseling recommendations.   - NIPT testing today  Return in about 4 weeks (around 01/30/2019) for Routine Prenatal Appointment.  Thomasene Mohair, MD, [facility], [facility] 01/02/2019 9:25 AM

## 2019-01-07 LAB — MATERNIT 21 PLUS CORE, BLOOD
Chromosome 13: NEGATIVE
Chromosome 18: NEGATIVE
Chromosome 21: NEGATIVE
Y Chromosome: NOT DETECTED

## 2019-01-19 ENCOUNTER — Encounter: Payer: Self-pay | Admitting: Emergency Medicine

## 2019-01-19 ENCOUNTER — Telehealth: Payer: Self-pay

## 2019-01-19 ENCOUNTER — Other Ambulatory Visit: Payer: Self-pay

## 2019-01-19 ENCOUNTER — Emergency Department
Admission: EM | Admit: 2019-01-19 | Discharge: 2019-01-19 | Disposition: A | Discharge status: Home / Self Care | Payer: Medicaid Other | Attending: Emergency Medicine | Admitting: Emergency Medicine

## 2019-01-19 DIAGNOSIS — J9801 Acute bronchospasm: Secondary | ICD-10-CM

## 2019-01-19 DIAGNOSIS — R059 Cough, unspecified: Secondary | ICD-10-CM

## 2019-01-19 DIAGNOSIS — R0602 Shortness of breath: Secondary | ICD-10-CM | POA: Diagnosis not present

## 2019-01-19 DIAGNOSIS — R05 Cough: Secondary | ICD-10-CM | POA: Diagnosis not present

## 2019-01-19 MED ORDER — ALBUTEROL SULFATE HFA 108 (90 BASE) MCG/ACT IN AERS: 2.0000 | INHALATION_SPRAY | Freq: Four times a day (QID) | RESPIRATORY_TRACT | 0 refills | Status: DC | PRN

## 2019-01-19 MED ORDER — FLUTICASONE PROPIONATE 50 MCG/ACT NA SUSP: 2.0000 | Freq: Every day | NASAL | 0 refills | Status: DC

## 2019-01-19 NOTE — Telephone Encounter (Signed)
Pt called and reports she is having weakness, throat scratchy, coughing, and difficulty breathing, and she thinks she may have a fever but hasn't checked it. Her symptoms started 2 days ago and pt is [redacted] weeks pregnant and works at [employer].  I advised the symptoms with Maurine Minister and he advised that pt should call her OB/GYN and advise them on her symptoms and that she may need to go to ER.  dbs

## 2019-01-19 NOTE — ED Notes (Signed)
Patient given printed discharge instructions and work note.

## 2019-01-19 NOTE — ED Triage Notes (Signed)
Patient states she had diarrhea 2 days ago, that resolved.  Today she is here complaining of SHOB, cough, sore throat and weakness.  Left work.  Non-productive cough.  ![redacted] weeks pregnant tomorrow.  Seen at Urgent Care earlier and sent here.  Alert and oriented, color good.  NAD. VSS. Temp 99 orally.

## 2019-01-19 NOTE — ED Provider Notes (Signed)
[facility] Emergency Department Provider Note ____________________________________________  Time seen: 1240  I have reviewed the triage vital signs and the nursing notes.  HISTORY  Chief Complaint  Weakness; Shortness of Breath; Cough; and Sore Throat  HPI ARIONA KULMAN is a 23 y.o. female presents to the ED for evaluation of symptoms per patient is [redacted] weeks gestational age, and denies any GYN complaints at this time.  She does report some shortness of breath, some cough, and some sore throat secondary to the cough.  She denies any frank fevers, but has had a nonproductive cough.  She was seen at urgent care earlier today, and was sent here for further evaluation after a negative rapid flu test.  Patient denies any high risk contacts, fevers, or recent travel.  He reports some resolved diarrhea 2 days prior, she has had decreased appetite but denies any syncope, chest pain, or abdominal pain.   Past Medical History:  Diagnosis Date  . Anxiety   . Asthma    sports induced  . Bipolar disorder (HCC)   . Chronic pelvic pain in female 09/09/2016  . Depression   . Endometriosis determined by laparoscopy 09/09/2016  . Ovarian cyst   . Pilonidal cyst   . PONV (postoperative nausea and vomiting)     Patient Active Problem List   Diagnosis Date Noted  . Encounter for supervision of normal first pregnancy in first trimester 12/19/2018  . Chronic pelvic pain in female 09/09/2016  . Endometriosis determined by laparoscopy 09/09/2016  . Anxiety disorder 12/05/2015  . PTSD (post-traumatic stress disorder) 11/10/2015  . Allergic rhinitis 05/10/2008  . Asthma, exogenous 05/10/2008    Past Surgical History:  Procedure Laterality Date  . COLONOSCOPY WITH ESOPHAGOGASTRODUODENOSCOPY (EGD)    . LAPAROSCOPY N/A 09/09/2016   Procedure: LAPAROSCOPY DIAGNOSTIC, FULGERATION OF ENDOMETRIOSIS;  Surgeon: Conard Novak, MD;  Location: [facility] ORS;  Service: Gynecology;   Laterality: N/A;  . PILONIDAL CYST EXCISION N/A 04/10/2015   Procedure: CYST EXCISION PILONIDAL EXTENSIVE;  Surgeon: Kieth Brightly, MD;  Location: [facility] ORS;  Service: General;  Laterality: N/A;    Prior to Admission medications   Medication Sig Start Date End Date Taking? Authorizing Provider  albuterol (PROVENTIL HFA;VENTOLIN HFA) 108 (90 Base) MCG/ACT inhaler Inhale 2 puffs into the lungs every 6 (six) hours as needed for wheezing or shortness of breath. 01/19/19   Clea Dubach, Charlesetta Ivory, PA-C  fluticasone (FLONASE) 50 MCG/ACT nasal spray Place 2 sprays into both nostrils daily. 01/19/19   Lameshia Hypolite, Charlesetta Ivory, PA-C    Allergies Aripiprazole and Vicodin [hydrocodone-acetaminophen]  Family History  Problem Relation Age of Onset  . Hyperlipidemia Father   . GER disease Father     Social History Social History   Tobacco Use  . Smoking status: Never Smoker  . Smokeless tobacco: Never Used  Substance Use Topics  . Alcohol use: Not Currently    Alcohol/week: 0.0 standard drinks    Comment: drinks on friday nights  . Drug use: Not Currently    Types: Marijuana    Review of Systems  Constitutional: Negative for fever. Eyes: Negative for visual changes. ENT: Positive for sore throat. Cardiovascular: Negative for chest pain. Respiratory: Positive for shortness of breath. Gastrointestinal: Negative for abdominal pain, vomiting and diarrhea. Genitourinary: Negative for dysuria. Denies vaginal bleeding.  Musculoskeletal: Negative for back pain. Skin: Negative for rash. Neurological: Negative for headaches, focal weakness or numbness. ____________________________________________  PHYSICAL EXAM:  VITAL SIGNS: ED Triage Vitals  Enc Vitals Group     BP 01/19/19 1230 129/81     Pulse Rate 01/19/19 1230 78     Resp 01/19/19 1230 16     Temp 01/19/19 1230 99 F (37.2 C)     Temp Source 01/19/19 1230 Oral     SpO2 01/19/19 1230 100 %     Weight 01/19/19 1231 166 lb  (75.3 kg)     Height 01/19/19 1231 5\' 2"  (1.575 m)     Head Circumference --      Peak Flow --      Pain Score 01/19/19 1231 0     Pain Loc --      Pain Edu? --      Excl. in GC? --     Constitutional: Alert and oriented. Well appearing and in no distress. Head: Normocephalic and atraumatic. Eyes: Conjunctivae are normal. Normal extraocular movements Mouth/Throat: Mucous membranes are moist. Hematological/Lymphatic/Immunological: No cervical lymphadenopathy. Cardiovascular: Normal rate, regular rhythm. Normal distal pulses. Respiratory: Normal respiratory effort. No wheezes/rales/rhonchi. Mild intermittent cough  Gastrointestinal: Soft and nontender. No distention. Musculoskeletal: Nontender with normal range of motion in all extremities.  Neurologic:  Normal gait without ataxia. Normal speech and language. No gross focal neurologic deficits are appreciated. Skin:  Skin is warm, dry and intact. No rash noted. ____________________________________________  PROCEDURES  Procedures ___________________________________________  INITIAL IMPRESSION / ASSESSMENT AND PLAN / ED COURSE  Patient's vital signs are stable and within normal limits including having no fever and no tachycardia.  PERC negative.  Low risk for ACS.  No worries some travel or sick contacts that would suggest an increased risk of COVID-19.  This patient does not meet criteria for testing currently and I explained that in detail.  The work-up today is reassuring with no evidence of emergent medical condition that requires further work-up or evaluation or inpatient treatment.  I gave my usual and customary follow-up recommendations and return precautions and the patient understands and agrees with the plan. The patient's symptoms are consistent with a mild bronchospasm.  ____________________________________________  FINAL CLINICAL IMPRESSION(S) / ED DIAGNOSES  Final diagnoses:  Cough  Bronchospasm, acute      Karmen Stabs,  Charlesetta Ivory, PA-C 01/19/19 1907    Loleta Rose, MD 01/19/19 2032

## 2019-01-19 NOTE — Discharge Instructions (Signed)
Your exam is consistent with a likely viral bronchospasm and bronchitis. Continue to monitor symptoms. Follow-up with your PCP & OB via phone, for further management. Return to the ED for worsening symptoms.

## 2019-01-19 NOTE — ED Triage Notes (Signed)
Attempted to listen to [facility] with Doppler X 2 RN's, unable to detect with patient sitting up.  Patient states her uterus is tilted and she's been told it's hard to hear.

## 2019-01-19 NOTE — Telephone Encounter (Signed)
Pt is preg; just got off phone c PCP; they think she has sxs of Covid-19.  Anything to worry about c the baby?  [phone]  Per JYS, we really don't know at this point.  Preliminary thought is that it is not tx'd to baby but we don't know for sure.  Take care of herself - wash hands, cover coughs, disinfect, to ED for any trouble breathing.  Given # to [facility] and [facility].

## 2019-01-29 ENCOUNTER — Encounter: Payer: Self-pay | Admitting: Obstetrics and Gynecology

## 2019-01-29 ENCOUNTER — Other Ambulatory Visit: Payer: Self-pay

## 2019-01-29 ENCOUNTER — Ambulatory Visit (INDEPENDENT_AMBULATORY_CARE_PROVIDER_SITE_OTHER): Payer: Medicaid Other | Admitting: Obstetrics and Gynecology

## 2019-01-29 VITALS — BP 118/84 | Wt 165.0 lb

## 2019-01-29 DIAGNOSIS — Z3401 Encounter for supervision of normal first pregnancy, first trimester: Secondary | ICD-10-CM

## 2019-01-29 DIAGNOSIS — Z3402 Encounter for supervision of normal first pregnancy, second trimester: Secondary | ICD-10-CM

## 2019-01-29 DIAGNOSIS — Z3A14 14 weeks gestation of pregnancy: Secondary | ICD-10-CM

## 2019-01-29 NOTE — Progress Notes (Signed)
No vb. No lof.  

## 2019-01-29 NOTE — Patient Instructions (Signed)
I value your feedback and entrusting us with your care. If you get a [facility] patient survey, I would appreciate you taking the time to let us know about your experience today. Thank you! 

## 2019-01-29 NOTE — Progress Notes (Signed)
  Subjective  Fetal Movement? no Contractions? no Leaking Fluid? no Vaginal Bleeding? no PNVs? yes  Objective  BP 118/84   Wt 165 lb (74.8 kg)   LMP 09/28/2018   BMI 30.18 kg/m  General: NAD Pulmonary: no increased work of breathing Abdomen: gravid, non-tender Extremities: no edema Psychiatric: mood appropriate, affect full  Assessment  22 y.o. G1P0000 at [redacted]w[redacted]d by  07/28/2019, by Ultrasound presenting for routine prenatal visit  Plan   Problem List Items Addressed This Visit    None     Labs: none. Pt doing well. Pos FHR. Having increased vag d/c without odor/irritation.  Neg MaterniT21 Plus Core 3/20  RTO 4 weeks  Alicia B. Copland, PA-C [facility],  01/29/2019  10:27 AM

## 2019-02-16 ENCOUNTER — Telehealth: Payer: Self-pay

## 2019-02-16 DIAGNOSIS — K5900 Constipation, unspecified: Secondary | ICD-10-CM | POA: Diagnosis not present

## 2019-02-16 NOTE — Telephone Encounter (Signed)
Patient called and requested a work note for the following days: 02/13/2019-02/17/2019. Patient stated she has been out of work due to her pregnancy. Patient has appointment schedule with Antony Contras on 02/19/2019 @ 8: 40 AM. I advised patient to contact her OB GYN doctor for work note since she has been seen there lately. L.O.V. 08/09/2018 with Deetta Perla.

## 2019-02-19 ENCOUNTER — Encounter: Payer: Medicaid Other | Admitting: Physician Assistant

## 2019-02-19 NOTE — Progress Notes (Signed)
Patient NOS e-visit. Erroneous encounter

## 2019-02-26 ENCOUNTER — Other Ambulatory Visit: Payer: Self-pay

## 2019-02-26 ENCOUNTER — Ambulatory Visit (INDEPENDENT_AMBULATORY_CARE_PROVIDER_SITE_OTHER): Payer: Medicaid Other | Admitting: Obstetrics & Gynecology

## 2019-02-26 ENCOUNTER — Encounter: Payer: Self-pay | Admitting: Obstetrics & Gynecology

## 2019-02-26 ENCOUNTER — Encounter: Payer: Medicaid Other | Admitting: Obstetrics and Gynecology

## 2019-02-26 VITALS — Wt 165.0 lb

## 2019-02-26 DIAGNOSIS — Z3402 Encounter for supervision of normal first pregnancy, second trimester: Secondary | ICD-10-CM | POA: Diagnosis not present

## 2019-02-26 DIAGNOSIS — Z3A17 17 weeks gestation of pregnancy: Secondary | ICD-10-CM | POA: Diagnosis not present

## 2019-02-26 NOTE — Progress Notes (Signed)
Virtual Visit via Telephone Note  I connected with patient on 02/26/19 at 10:00 AM EDT by telephone and verified that I am speaking with the correct person using two identifiers.   I discussed the limitations, risks, security and privacy concerns of performing an evaluation and management service by telephone and the availability of in person appointments. I also discussed with the patient that there may be a patient responsible charge related to this service. The patient expressed understanding and agreed to proceed.  The patient was at home I spoke with the patient from my work  Madison Dixon is a 23 y.o. G1P0000 at [redacted]w[redacted]d being seen today for ongoing prenatal care.  She is currently monitored for the following issues for this low-risk pregnancy and has Allergic rhinitis; Asthma, exogenous; PTSD (post-traumatic stress disorder); Anxiety disorder; Chronic pelvic pain in female; Endometriosis determined by laparoscopy; and Encounter for supervision of normal first pregnancy in first trimester on their problem list.  ----------------------------------------------------------------------------------- Patient reports no complaints.   Denies pain, VB, leaking of fluid.  ----------------------------------------------------------------------------------- The following portions of the patient's history were reviewed and updated as appropriate: allergies, current medications, past family history, past medical history, past social history, past surgical history and problem list. Problem list updated.   Objective  Weight 165 lb (74.8 kg), last menstrual period 09/28/2018. Pregravid weight 165 lb (74.8 kg) Total Weight Gain 0 lb (0 kg)  Physical Exam could not be performed. Because of the COVID-19 outbreak this visit was performed over the phone and not in person.   Assessment   22 y.o. G1P0000 at [redacted]w[redacted]d by  07/28/2019, by Ultrasound presenting for routine prenatal visit  Plan   Pregnancy#1  Problems (from 09/28/18 to present)    Problem Noted Resolved   Encounter for supervision of normal first pregnancy in first trimester  No   Overview Signed 01/02/2019  9:03 AM by Conard Novak, MD    Clinic [facility] Prenatal Labs  Dating [place] Blood type: A/Positive/-- (02/25 1218)   Genetic Screen NIPS: Normal XX Antibody:Negative (02/25 1218)  Anatomic [place] soon Rubella: 2.70 (02/25 1218) Varicella:NI  GTT Third trimester:  RPR: Non Reactive (02/25 1218)   Rhogam A Pos HBsAg: Negative (02/25 1218)   TDaP vaccine             Flu Shot: HIV: Non Reactive (02/25 1218)   Baby Food          Breast                      GBS: pending  Contraception          Uncertain Pap:PP  CBB     No   CS/VBAC    N/a             PNV  Gestational age appropriate obstetric precautions including but not limited to vaginal bleeding, contractions, leaking of fluid and fetal movement were reviewed in detail with the patient.     Follow Up Instructions: 2 weeks w Anat [place]   I discussed the assessment and treatment plan with the patient. The patient was provided an opportunity to ask questions and all were answered. The patient agreed with the plan and demonstrated an understanding of the instructions.   The patient was advised to call back or seek an in-person evaluation if the symptoms worsen or if the condition fails to improve as anticipated.  I provided 9 minutes of non-face-to-face time during this encounter.  Return in about  2 weeks (around 03/12/2019) for ROB in office w Anat US.  Annamarie MajorPaul Hakeem Frazzini, MD [facility], [facility] 02/26/2019 10:23 AM

## 2019-03-12 ENCOUNTER — Encounter: Payer: Self-pay | Admitting: Advanced Practice Midwife

## 2019-03-12 ENCOUNTER — Ambulatory Visit (INDEPENDENT_AMBULATORY_CARE_PROVIDER_SITE_OTHER): Payer: Medicaid Other | Admitting: Advanced Practice Midwife

## 2019-03-12 ENCOUNTER — Other Ambulatory Visit: Payer: Self-pay

## 2019-03-12 ENCOUNTER — Ambulatory Visit
Admission: RE | Admit: 2019-03-12 | Discharge: 2019-03-12 | Disposition: A | Discharge status: Home / Self Care | Payer: Medicaid Other | Source: Ambulatory Visit | Attending: Obstetrics & Gynecology | Admitting: Obstetrics & Gynecology

## 2019-03-12 VITALS — BP 112/62 | Wt 169.0 lb

## 2019-03-12 DIAGNOSIS — Z3A17 17 weeks gestation of pregnancy: Secondary | ICD-10-CM | POA: Diagnosis not present

## 2019-03-12 DIAGNOSIS — Z3A20 20 weeks gestation of pregnancy: Secondary | ICD-10-CM

## 2019-03-12 DIAGNOSIS — Z3400 Encounter for supervision of normal first pregnancy, unspecified trimester: Secondary | ICD-10-CM

## 2019-03-12 DIAGNOSIS — Z3402 Encounter for supervision of normal first pregnancy, second trimester: Secondary | ICD-10-CM

## 2019-03-12 LAB — POCT URINALYSIS DIPSTICK OB
Glucose, UA: NEGATIVE
POC,PROTEIN,UA: NEGATIVE

## 2019-03-12 NOTE — Progress Notes (Signed)
Routine Prenatal Care Visit  Subjective  Madison Dixon is a 23 y.o. G1P0000 at [redacted]w[redacted]d being seen today for ongoing prenatal care.  She is currently monitored for the following issues for this low-risk pregnancy and has Allergic rhinitis; Asthma, exogenous; PTSD (post-traumatic stress disorder); Anxiety disorder; Chronic pelvic pain in female; Endometriosis determined by laparoscopy; and Encounter for supervision of normal first pregnancy in first trimester on their problem list.  ----------------------------------------------------------------------------------- Patient reports no complaints.  Patient did not receive ultrasound pictures today at [facility] anatomy scan.  Contractions: Not present. Vag. Bleeding: None.  Movement: Present. Denies leaking of fluid.  ----------------------------------------------------------------------------------- The following portions of the patient's history were reviewed and updated as appropriate: allergies, current medications, past family history, past medical history, past social history, past surgical history and problem list. Problem list updated.   Objective  Blood pressure 112/62, weight 169 lb (76.7 kg), last menstrual period 09/28/2018. Pregravid weight 165 lb (74.8 kg) Total Weight Gain 4 lb (1.814 kg) Urinalysis: Urine Protein Negative  Urine Glucose Negative  Fetal Status: Fetal Heart Rate (bpm): 138   Movement: Present     Anatomy scan done at [facility] prior to [facility] visit today. Report not available.   General:  Alert, oriented and cooperative. Patient is in no acute distress.  Skin: Skin is warm and dry. No rash noted.   Cardiovascular: Normal heart rate noted  Respiratory: Normal respiratory effort, no problems with respiration noted  Abdomen: Soft, gravid, appropriate for gestational age. Pain/Pressure: Absent     Pelvic:  Cervical exam deferred        Extremities: Normal range of motion.  Edema: None  Mental Status: Normal mood and  affect. Normal behavior. Normal judgment and thought content.   Assessment   23 y.o. G1P0000 at [redacted]w[redacted]d by  07/28/2019, by Ultrasound presenting for routine prenatal visit  Plan   Pregnancy#1 Problems (from 09/28/18 to present)    Problem Noted Resolved   Encounter for supervision of normal first pregnancy in first trimester 12/19/2018 by Conard Novak, MD No   Overview Signed 01/02/2019  9:03 AM by Conard Novak, MD    Clinic [facility] Prenatal Labs  Dating  Blood type: A/Positive/-- (02/25 1218)   Genetic Screen 1 Screen:    AFP:     Quad:     NIPS: Antibody:Negative (02/25 1218)  Anatomic [place]  Rubella: 2.70 (02/25 1218) Varicella: @VZVIGG @  GTT Early:               Third trimester:  RPR: Non Reactive (02/25 1218)   Rhogam  HBsAg: Negative (02/25 1218)   TDaP vaccine                       Flu Shot: HIV: Non Reactive (02/25 1218)   Baby Food                                GBS:   Contraception  Pap:  CBB     CS/VBAC    Support Person                Preterm labor symptoms and general obstetric precautions including but not limited to vaginal bleeding, contractions, leaking of fluid and fetal movement were reviewed in detail with the patient.    Return in about 4 weeks (around 04/09/2019) for follow up anatomy and rob.  Tresea Mall, CNM 03/12/2019 2:40 PM

## 2019-03-12 NOTE — Progress Notes (Signed)
ROB  °Anatomy scan °

## 2019-03-13 NOTE — Progress Notes (Signed)
Review of ULTRASOUND. I have personally reviewed images and report of recent ultrasound done at [facility]. There is a singleton gestation with subjectively normal amniotic fluid volume. The fetal biometry correlates with established dating. Detailed evaluation of the fetal anatomy was performed.The fetal anatomical survey appears within normal limits within the resolution of ultrasound as described above.  It must be noted that a normal ultrasound is unable to rule out fetal aneuploidy.    Annamarie Major, MD, [facility], [facility] 03/13/2019  1:59 PM

## 2019-03-14 ENCOUNTER — Telehealth: Payer: Self-pay

## 2019-03-14 NOTE — Telephone Encounter (Signed)
FMLA/DISABILITY form for [employer] filled out, signature obtained and given to KT for processing. 

## 2019-04-05 ENCOUNTER — Telehealth: Payer: Self-pay | Admitting: *Deleted

## 2019-04-05 ENCOUNTER — Ambulatory Visit (INDEPENDENT_AMBULATORY_CARE_PROVIDER_SITE_OTHER): Payer: Medicaid Other | Admitting: Family Medicine

## 2019-04-05 ENCOUNTER — Telehealth: Payer: Self-pay

## 2019-04-05 DIAGNOSIS — R05 Cough: Secondary | ICD-10-CM | POA: Diagnosis not present

## 2019-04-05 DIAGNOSIS — R059 Cough, unspecified: Secondary | ICD-10-CM

## 2019-04-05 DIAGNOSIS — J029 Acute pharyngitis, unspecified: Secondary | ICD-10-CM | POA: Diagnosis not present

## 2019-04-05 DIAGNOSIS — R0602 Shortness of breath: Secondary | ICD-10-CM

## 2019-04-05 DIAGNOSIS — Z20822 Contact with and (suspected) exposure to covid-19: Secondary | ICD-10-CM

## 2019-04-05 NOTE — Telephone Encounter (Signed)
This needs to go to [facility] provider. Also, possible covid testing?

## 2019-04-05 NOTE — Telephone Encounter (Signed)
Pt called triage reporting feeling weak with a cough and has SOB, she states she feels like she has bronchitis, as she has had this in the past, Her PCP told her to contact us since she is pregnant. Pt would like to know if someone could prescribe her a inhaler? Please advise

## 2019-04-05 NOTE — Telephone Encounter (Signed)
Patient called and scheduled for testing at [facility] site on 04/06/19 at 10 am. Pt advised to wear a mask and remain in car at the time of appt. Pt verbalized understanding.

## 2019-04-05 NOTE — Telephone Encounter (Signed)
-----   Message from Virginia Crews, MD sent at 04/05/2019  3:51 PM EDT ----- Pregnant, cough, sore throat, SOB.  Please set up for COVID testing. Thanks!

## 2019-04-05 NOTE — Telephone Encounter (Signed)
Pt aware.

## 2019-04-05 NOTE — Progress Notes (Signed)
Patient: Madison Dixon Female    DOB: 03-Mar-1996   23 y.o.   MRN: [id number] Visit Date: 04/05/2019  Today's Provider: Shirlee LatchAngela Bacigalupo, MD   Chief Complaint  Patient presents with  . URI   Subjective:    Virtual Visit via Video Note  I connected with Madison Dixon on 04/05/19 at  3:40 PM EDT by a video enabled telemedicine application and verified that I am speaking with the correct person using two identifiers.   Patient location: home Provider [facility] Persons involved in the visit: patient, provider   I discussed the limitations of evaluation and management by telemedicine and the availability of in person appointments. The patient expressed understanding and agreed to proceed.  URI  This is a new problem. The current episode started today. The problem has been gradually worsening. There has been no fever. Associated symptoms include coughing and a sore throat. Associated symptoms comments: Fatigue & SOB . She has tried nothing for the symptoms.   Myalgias for a few days, but much worse today.  Non-productive cough.  Chest tightness.  Generalized weakness.    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  Has not been to large gathering, but has been to restaurants.  She is currently pregnant and worried about being sick while pregnant   Allergies  Allergen Reactions  . Aripiprazole Nausea And Vomiting  . Vicodin [Hydrocodone-Acetaminophen] Nausea And Vomiting    No current outpatient medications on file.  Review of Systems  Constitutional: Positive for fatigue.  HENT: Positive for sore throat.   Respiratory: Positive for cough and shortness of breath.   Cardiovascular: Negative.   Musculoskeletal: Negative.   Neurological: Positive for weakness.    Social History   Tobacco Use  . Smoking status: Never Smoker  . Smokeless tobacco: Never Used  Substance Use Topics  . Alcohol use: Not Currently    Alcohol/week: 0.0 standard  drinks    Comment: drinks on friday nights      Objective:   LMP 09/28/2018  There were no vitals filed for this visit.   Physical Exam Constitutional:      General: She is not in acute distress.    Appearance: Normal appearance. She is ill-appearing. She is not toxic-appearing.  Pulmonary:     Effort: Pulmonary effort is normal. No respiratory distress.  Neurological:     Mental Status: She is alert and oriented to person, place, and time.  Psychiatric:        Mood and Affect: Mood normal.        Behavior: Behavior normal.         Assessment & Plan      I discussed the assessment and treatment plan with the patient. The patient was provided an opportunity to ask questions and all were answered. The patient agreed with the plan and demonstrated an understanding of the instructions.   The patient was advised to call back or seek an in-person evaluation if the symptoms worsen or if the condition fails to improve as anticipated.  I provided 25 minutes of non-face-to-face time during this encounter.  1. Cough 2. Sore throat 3. Shortness of breath - new problem x1-2 days - constellation of symptoms concerning for possible COVID19 infection - will send for outpatient testing - discussed symptomatic management, return precautions, and self quarantine for her and all household contacts - reassured patient that with COVID19 infection, pregnant women do not seem to be affected any  worse than others    No follow-ups on file.   The entirety of the information documented in the History of Present Illness, Review of Systems and Physical Exam were personally obtained by me. Portions of this information were initially documented by Tiburcio Pea, CMA and reviewed by me for thoroughness and accuracy.    Bacigalupo, Dionne Bucy, MD MPH [facility]

## 2019-04-05 NOTE — Telephone Encounter (Signed)
She needs to go to someone who does COVID testing.  I realize she has asthma but with the cough and symptoms "like bronchitis" COVID testing would be what is warranted.  So I would advise evaluation at the ER

## 2019-04-06 ENCOUNTER — Other Ambulatory Visit: Payer: Self-pay

## 2019-04-06 DIAGNOSIS — Z20822 Contact with and (suspected) exposure to covid-19: Secondary | ICD-10-CM

## 2019-04-08 LAB — NOVEL CORONAVIRUS, NAA: SARS-CoV-2, NAA: NOT DETECTED

## 2019-04-09 ENCOUNTER — Telehealth: Payer: Self-pay

## 2019-04-09 NOTE — Telephone Encounter (Signed)
Letter and lab printed

## 2019-04-09 NOTE — Telephone Encounter (Signed)
Faxed to number provided

## 2019-04-09 NOTE — Telephone Encounter (Signed)
Patient is requesting a work note stating COVID was negative and also the results faxed to employer. [employer] fax# [phone] Attn: Haroldine Laws

## 2019-04-16 ENCOUNTER — Other Ambulatory Visit: Payer: Self-pay

## 2019-04-16 ENCOUNTER — Ambulatory Visit (INDEPENDENT_AMBULATORY_CARE_PROVIDER_SITE_OTHER): Payer: Medicaid Other | Admitting: Obstetrics and Gynecology

## 2019-04-16 ENCOUNTER — Ambulatory Visit (INDEPENDENT_AMBULATORY_CARE_PROVIDER_SITE_OTHER): Payer: Medicaid Other

## 2019-04-16 ENCOUNTER — Encounter: Payer: Self-pay | Admitting: Obstetrics and Gynecology

## 2019-04-16 VITALS — BP 108/62 | Wt 171.0 lb

## 2019-04-16 DIAGNOSIS — Z3A25 25 weeks gestation of pregnancy: Secondary | ICD-10-CM

## 2019-04-16 DIAGNOSIS — Z113 Encounter for screening for infections with a predominantly sexual mode of transmission: Secondary | ICD-10-CM

## 2019-04-16 DIAGNOSIS — F411 Generalized anxiety disorder: Secondary | ICD-10-CM

## 2019-04-16 DIAGNOSIS — Z3400 Encounter for supervision of normal first pregnancy, unspecified trimester: Secondary | ICD-10-CM

## 2019-04-16 DIAGNOSIS — Z362 Encounter for other antenatal screening follow-up: Secondary | ICD-10-CM

## 2019-04-16 DIAGNOSIS — Z131 Encounter for screening for diabetes mellitus: Secondary | ICD-10-CM

## 2019-04-16 DIAGNOSIS — O99342 Other mental disorders complicating pregnancy, second trimester: Secondary | ICD-10-CM

## 2019-04-16 LAB — POCT URINALYSIS DIPSTICK OB
Glucose, UA: NEGATIVE
POC,PROTEIN,UA: NEGATIVE

## 2019-04-16 NOTE — Progress Notes (Signed)
Routine Prenatal Care Visit  Subjective  Madison Dixon is a 23 y.o. G1P0000 at 6266w2d being seen today for ongoing prenatal care.  She is currently monitored for the following issues for this low-risk pregnancy and has Allergic rhinitis; Asthma, exogenous; PTSD (post-traumatic stress disorder); Anxiety disorder; Chronic pelvic pain in female; Endometriosis determined by laparoscopy; and Supervision of normal first pregnancy on their problem list.  ----------------------------------------------------------------------------------- Patient reports no complaints.   Contractions: Not present. Vag. Bleeding: None.  Movement: Present. Denies leaking of fluid.  U/S anatomy complete today. MVP: 8.2 cm ----------------------------------------------------------------------------------- The following portions of the patient's history were reviewed and updated as appropriate: allergies, current medications, past family history, past medical history, past social history, past surgical history and problem list. Problem list updated.   Objective  Blood pressure 108/62, weight 171 lb (77.6 kg), last menstrual period 09/28/2018. Pregravid weight 165 lb (74.8 kg) Total Weight Gain 6 lb (2.722 kg) Urinalysis: Urine Protein Negative  Urine Glucose Negative  Fetal Status: Fetal Heart Rate (bpm): present-normal   Movement: Present     General:  Alert, oriented and cooperative. Patient is in no acute distress.  Skin: Skin is warm and dry. No rash noted.   Cardiovascular: Normal heart rate noted  Respiratory: Normal respiratory effort, no problems with respiration noted  Abdomen: Soft, gravid, appropriate for gestational age. Pain/Pressure: Absent     Pelvic:  Cervical exam deferred        Extremities: Normal range of motion.  Edema: None  Mental Status: Normal mood and affect. Normal behavior. Normal judgment and thought content.   Imaging Results [place] Ob Follow Up  Result Date: 04/16/2019 Patient Name:  Madison Dixon DOB: Jul 31, 1996 MRN: [id number] ULTRASOUND REPORT Location: [facility] Date of Service: 04/16/2019 Indications: Anatomy follow up ultrasound Findings: Mason JimSingleton intrauterine pregnancy is visualized with FHR at 134 BPM. Fetal presentation is Cephalic. Placenta: anterior. Grade: 1 AFI: MVP 8.2 cm (mild hydramnios) Anatomic survey is complete for nose/lips. There is no free peritoneal fluid in the cul de sac. Impression: 1. 2966w2d Viable Singleton Intrauterine pregnancy previously established criteria. 2. Normal Anatomy Scan is now complete 3. Mild hydramnios Recommendations: 1.Clinical correlation with the patient's History and Physical Exam. 2. Recommend follow up ultrasound at 30-32 weeks to assess fluid levels. Deanna ArtisElyse S Fairbanks, RT The ultrasound images and findings were reviewed by me and I agree with the above report. Thomasene Mohair , MD, [facility] Frederick[facility]AC[facility], [facility] 04/16/2019 10:21 AM       Assessment   22 y.o. G1P0000 at 7766w2d by  07/28/2019, by Ultrasound presenting for routine prenatal visit  Plan   Pregnancy#1 Problems (from 09/28/18 to present)    Problem Noted Resolved   Supervision of normal first pregnancy 12/19/2018 by Conard Novak,  D, MD No   Overview Signed 01/02/2019  9:03 AM by Conard Novak,  D, MD    Clinic [facility] Prenatal Labs  Dating  Blood type: A/Positive/-- (02/25 1218)   Genetic Screen 1 Screen:    AFP:     Quad:     NIPS: Antibody:Negative (02/25 1218)  Anatomic US  Rubella: 2.70 (02/25 1218) Varicella: @VZVIGG @  GTT Early:               Third trimester:  RPR: Non Reactive (02/25 1218)   Rhogam  HBsAg: Negative (02/25 1218)   TDaP vaccine                       Flu  Shot: HIV: Non Reactive (02/25 1218)   Baby Food                                GBS:   Contraception  Pap:  CBB     CS/VBAC    Support Person              Preterm labor symptoms and general obstetric precautions including but not limited to vaginal  bleeding, contractions, leaking of fluid and fetal movement were reviewed in detail with the patient. Please refer to After Visit Summary for other counseling recommendations.   Return in about 2 weeks (around 04/30/2019) for 28 week labs and routine prenatal.  Prentice Docker, MD, [place], [facility] 04/16/2019 10:46 AM

## 2019-04-30 ENCOUNTER — Other Ambulatory Visit: Payer: Self-pay

## 2019-04-30 ENCOUNTER — Ambulatory Visit (INDEPENDENT_AMBULATORY_CARE_PROVIDER_SITE_OTHER): Payer: Medicaid Other | Admitting: Maternal Newborn

## 2019-04-30 ENCOUNTER — Other Ambulatory Visit: Payer: Medicaid Other

## 2019-04-30 ENCOUNTER — Encounter: Payer: Self-pay | Admitting: Maternal Newborn

## 2019-04-30 VITALS — BP 110/64 | Wt 171.0 lb

## 2019-04-30 DIAGNOSIS — Z3A27 27 weeks gestation of pregnancy: Secondary | ICD-10-CM

## 2019-04-30 DIAGNOSIS — Z113 Encounter for screening for infections with a predominantly sexual mode of transmission: Secondary | ICD-10-CM

## 2019-04-30 DIAGNOSIS — Z3400 Encounter for supervision of normal first pregnancy, unspecified trimester: Secondary | ICD-10-CM

## 2019-04-30 DIAGNOSIS — Z131 Encounter for screening for diabetes mellitus: Secondary | ICD-10-CM

## 2019-04-30 DIAGNOSIS — Z369 Encounter for antenatal screening, unspecified: Secondary | ICD-10-CM

## 2019-04-30 DIAGNOSIS — Z3402 Encounter for supervision of normal first pregnancy, second trimester: Secondary | ICD-10-CM

## 2019-04-30 LAB — POCT URINALYSIS DIPSTICK OB
Glucose, UA: NEGATIVE
POC,PROTEIN,UA: NEGATIVE

## 2019-04-30 NOTE — Progress Notes (Signed)
hyOccasional pressure. Breast have started leaking.

## 2019-04-30 NOTE — Progress Notes (Signed)
    Routine Prenatal Care Visit  Subjective  Madison Dixon is a 23 y.o. G1P0000 at [redacted]w[redacted]d being seen today for ongoing prenatal care.  She is currently monitored for the following issues for this low-risk pregnancy and has Allergic rhinitis; Asthma, exogenous; PTSD (post-traumatic stress disorder); Anxiety disorder; Chronic pelvic pain in female; Endometriosis determined by laparoscopy; and Supervision of normal first pregnancy on their problem list.  ----------------------------------------------------------------------------------- Patient reports that breasts are leaking colostrum occasionally. Sometimes has pelvic pressure. Contractions: Not present. Vag. Bleeding: None.  Movement: Present. No leaking of fluid.  ----------------------------------------------------------------------------------- The following portions of the patient's history were reviewed and updated as appropriate: allergies, current medications, past family history, past medical history, past social history, past surgical history and problem list. Problem list updated.   Objective  Blood pressure 110/64, weight 171 lb (77.6 kg), last menstrual period 09/28/2018. Pregravid weight 165 lb (74.8 kg) Total Weight Gain 6 lb (2.722 kg) Urinalysis: Urine dipstick shows negative for glucose, protein.  Fetal Status: Fetal Heart Rate (bpm): 138 Fundal Height: 27 cm Movement: Present     General:  Alert, oriented and cooperative. Patient is in no acute distress.  Skin: Skin is warm and dry. No rash noted.   Cardiovascular: Normal heart rate noted  Respiratory: Normal respiratory effort, no problems with respiration noted  Abdomen: Soft, gravid, appropriate for gestational age. Pain/Pressure: Present     Pelvic:  Cervical exam deferred        Extremities: Normal range of motion.  Edema: None  Mental Status: Normal mood and affect. Normal behavior. Normal judgment and thought content.     Assessment   23 y.o. G1P0000 at  [redacted]w[redacted]d, EDD 07/28/2019 by Ultrasound presenting for a routine prenatal visit.  Plan   Pregnancy#1 Problems (from 09/28/18 to present)    Problem Noted Resolved   Supervision of normal first pregnancy 12/19/2018 by Will Bonnet, MD No   Overview Signed 01/02/2019  9:03 AM by Will Bonnet, MD    Clinic [facility] Prenatal Labs  Dating  Blood type: A/Positive/-- (02/25 1218)   Genetic Screen 1 Screen:    AFP:     Quad:     NIPS: Antibody:Negative (02/25 1218)  Anatomic [place]  Rubella: 2.70 (02/25 1218) Varicella: @VZVIGG @  GTT Early:               Third trimester:  RPR: Non Reactive (02/25 1218)   Rhogam  HBsAg: Negative (02/25 1218)   TDaP vaccine                       Flu Shot: HIV: Non Reactive (02/25 1218)   Baby Food                                GBS:   Contraception  Pap:  CBB     CS/VBAC    Support Person             Ultrasound to check fluid level at next visit as recommended by Dr. Glennon Mac on review of last ultrasound.  Please refer to After Visit Summary for other counseling recommendations.   Return in about 3 weeks (around 05/21/2019) for ROB with ultrasound.  Avel Sensor, CNM 04/30/2019  9:25 AM

## 2019-04-30 NOTE — Patient Instructions (Signed)

## 2019-05-01 LAB — 28 WEEK RH+PANEL
Basophils Absolute: 0 10*3/uL (ref 0.0–0.2)
Basos: 0 %
EOS (ABSOLUTE): 0.1 10*3/uL (ref 0.0–0.4)
Eos: 1 %
Gestational Diabetes Screen: 126 mg/dL (ref 65–139)
HIV Screen 4th Generation wRfx: NONREACTIVE
Hematocrit: 30.9 % — ABNORMAL LOW (ref 34.0–46.6)
Hemoglobin: 10.3 g/dL — ABNORMAL LOW (ref 11.1–15.9)
Immature Grans (Abs): 0.1 10*3/uL (ref 0.0–0.1)
Immature Granulocytes: 1 %
Lymphocytes Absolute: 1.1 10*3/uL (ref 0.7–3.1)
Lymphs: 13 %
MCH: 29.3 pg (ref 26.6–33.0)
MCHC: 33.3 g/dL (ref 31.5–35.7)
MCV: 88 fL (ref 79–97)
Monocytes Absolute: 0.4 10*3/uL (ref 0.1–0.9)
Monocytes: 5 %
Neutrophils Absolute: 7.1 10*3/uL — ABNORMAL HIGH (ref 1.4–7.0)
Neutrophils: 80 %
Platelets: 243 10*3/uL (ref 150–450)
RBC: 3.51 x10E6/uL — ABNORMAL LOW (ref 3.77–5.28)
RDW: 12.6 % (ref 11.7–15.4)
RPR Ser Ql: NONREACTIVE
WBC: 8.9 10*3/uL (ref 3.4–10.8)

## 2019-05-16 ENCOUNTER — Ambulatory Visit (INDEPENDENT_AMBULATORY_CARE_PROVIDER_SITE_OTHER): Payer: Medicaid Other | Admitting: Maternal Newborn

## 2019-05-16 ENCOUNTER — Encounter: Payer: Self-pay | Admitting: Maternal Newborn

## 2019-05-16 ENCOUNTER — Other Ambulatory Visit: Payer: Self-pay

## 2019-05-16 VITALS — BP 104/60 | Wt 172.0 lb

## 2019-05-16 DIAGNOSIS — R8290 Unspecified abnormal findings in urine: Secondary | ICD-10-CM | POA: Diagnosis not present

## 2019-05-16 DIAGNOSIS — O9989 Other specified diseases and conditions complicating pregnancy, childbirth and the puerperium: Secondary | ICD-10-CM

## 2019-05-16 DIAGNOSIS — R1011 Right upper quadrant pain: Secondary | ICD-10-CM

## 2019-05-16 DIAGNOSIS — Z3400 Encounter for supervision of normal first pregnancy, unspecified trimester: Secondary | ICD-10-CM

## 2019-05-16 DIAGNOSIS — R112 Nausea with vomiting, unspecified: Secondary | ICD-10-CM

## 2019-05-16 LAB — POCT URINALYSIS DIPSTICK
Bilirubin, UA: NEGATIVE
Blood, UA: NEGATIVE
Glucose, UA: NEGATIVE
Ketones, UA: NEGATIVE
Nitrite, UA: NEGATIVE
Protein, UA: POSITIVE — AB
Urobilinogen, UA: 0.2 E.U./dL
pH, UA: >=9 — AB (ref 5.0–8.0)

## 2019-05-16 NOTE — Addendum Note (Signed)
Addended by: Avel Sensor on: 05/16/2019 10:02 AM   Modules accepted: Orders

## 2019-05-16 NOTE — Progress Notes (Signed)
Right side pain- started as sore and now is a stabbing pain that radiates from side of stomach to shoulder, throwing up a lot

## 2019-05-16 NOTE — Progress Notes (Signed)
Prenatal Problem Visit  Subjective  Madison Dixon is a 23 y.o. G1P0000 at 3965w4d being seen today for ongoing prenatal care.  She is currently monitored for the following issues for this low-risk pregnancy and has Allergic rhinitis; Chronic pelvic pain in female; Endometriosis determined by laparoscopy; and Supervision of normal first pregnancy on their problem list.  ----------------------------------------------------------------------------------- Patient reports intermittent intense pain in her right upper quadrant which began three days ago. It radiates to her right shoulder. Nothing has made it better. It is worse after eating. She feels as though it is difficult to take a deep breath because of the pain. She also reports vomiting during this time with emesis that has food contents and is then yellow. She has had diarrhea as well. She has not had headaches, visual changes, new edema, coughing, or wheezing.  Contractions: Not present. Vag. Bleeding: None.  Movement: Present. No leaking of fluid.  ----------------------------------------------------------------------------------- The following portions of the patient's history were reviewed and updated as appropriate: allergies, current medications, past family history, past medical history, past social history, past surgical history and problem list. Problem list updated.   Objective  Blood pressure 104/60, weight 172 lb (78 kg), last menstrual period 09/28/2018. Pregravid weight 165 lb (74.8 kg) Total Weight Gain 7 lb (3.175 kg) Urinalysis:      Fetal Status: Fetal Heart Rate (bpm): 140   Movement: Present     General:  Alert, oriented and cooperative. Patient is in no acute distress.  Skin: Skin is warm and dry. No rash noted.   Cardiovascular: Normal heart rate noted  Respiratory: Normal respiratory effort, no problems with respiration noted  Abdomen: Soft, gravid, appropriate for gestational age. Pain/Pressure: Present      Pelvic:  Cervical exam deferred        Extremities: Normal range of motion.  Edema: Trace  Mental Status: Normal mood and affect. Normal behavior. Normal judgment and thought content.     Assessment   23 y.o. G1P0000 at 6265w4d, EDD 07/28/2019 by Ultrasound presenting for a work-in prenatal visit.  Plan   Pregnancy#1 Problems (from 09/28/18 to present)    Problem Noted Resolved   Supervision of normal first pregnancy 12/19/2018 by Conard NovakJackson, Stephen D, MD No   Overview Signed 01/02/2019  9:03 AM by Conard NovakJackson, Stephen D, MD    [facility] Prenatal Labs  Dating  Blood type: A/Positive/-- (02/25 1218)   Genetic Screen 1 Screen:    AFP:     Quad:     NIPS: Antibody:Negative (02/25 1218)  Anatomic US  Rubella: 2.70 (02/25 1218) Varicella: @VZVIGG @  GTT Early:               Third trimester:  RPR: Non Reactive (02/25 1218)   Rhogam  HBsAg: Negative (02/25 1218)   TDaP vaccine                       Flu Shot: HIV: Non Reactive (02/25 1218)   Baby Food                                GBS:   Contraception  Pap:  CBB     CS/VBAC    Support Person             Advised her to go to the emergency department for evaluation due to duration of symptoms and continued emesis. Do not suspect pre-eclampsia at  this time, as she is normotensive and lacks other symptoms, though labs are warranted to rule it out. Possible cholecystitis.  Incidental finding of leukocytes on UA, will send urine culture.  Avel Sensor, CNM 05/16/2019  9:34 AM

## 2019-05-16 NOTE — Addendum Note (Signed)
Addended by: Drenda Freeze on: 05/16/2019 09:51 AM   Modules accepted: Orders

## 2019-05-18 LAB — URINE CULTURE

## 2019-05-21 ENCOUNTER — Ambulatory Visit (INDEPENDENT_AMBULATORY_CARE_PROVIDER_SITE_OTHER): Payer: Medicaid Other | Admitting: Maternal Newborn

## 2019-05-21 ENCOUNTER — Encounter: Payer: Self-pay | Admitting: Maternal Newborn

## 2019-05-21 ENCOUNTER — Ambulatory Visit (INDEPENDENT_AMBULATORY_CARE_PROVIDER_SITE_OTHER): Payer: Medicaid Other

## 2019-05-21 ENCOUNTER — Other Ambulatory Visit: Payer: Self-pay

## 2019-05-21 VITALS — BP 110/60 | Wt 173.0 lb

## 2019-05-21 DIAGNOSIS — Z362 Encounter for other antenatal screening follow-up: Secondary | ICD-10-CM | POA: Diagnosis not present

## 2019-05-21 DIAGNOSIS — Z369 Encounter for antenatal screening, unspecified: Secondary | ICD-10-CM

## 2019-05-21 DIAGNOSIS — Z3403 Encounter for supervision of normal first pregnancy, third trimester: Secondary | ICD-10-CM

## 2019-05-21 DIAGNOSIS — Z3A30 30 weeks gestation of pregnancy: Secondary | ICD-10-CM

## 2019-05-21 LAB — POCT URINALYSIS DIPSTICK OB
Glucose, UA: NEGATIVE
POC,PROTEIN,UA: NEGATIVE

## 2019-05-21 NOTE — Progress Notes (Signed)
    Routine Prenatal Care Visit  Subjective  Madison Dixon is a 23 y.o. G1P0000 at [redacted]w[redacted]d being seen today for ongoing prenatal care.  She is currently monitored for the following issues for this low-risk pregnancy and has Allergic rhinitis; Chronic pelvic pain in female; Endometriosis determined by laparoscopy; and Supervision of normal first pregnancy on their problem list.  ----------------------------------------------------------------------------------- Patient reports symptoms have improved from last visit, no more vomiting and no pain unless baby kicks her on the right side. Contractions: Not present. Vag. Bleeding: None.  Movement: Present. No leaking of fluid.  ----------------------------------------------------------------------------------- The following portions of the patient's history were reviewed and updated as appropriate: allergies, current medications, past family history, past medical history, past social history, past surgical history and problem list. Problem list updated.   Objective  Blood pressure 110/60, weight 173 lb (78.5 kg), last menstrual period 09/28/2018. Pregravid weight 165 lb (74.8 kg) Total Weight Gain 8 lb (3.629 kg) Urinalysis: Urine dipstick shows negative for glucose, protein.  Fetal Status: Fetal Heart Rate (bpm): 144 ([place])   Movement: Present  Presentation: Vertex  General:  Alert, oriented and cooperative. Patient is in no acute distress.  Skin: Skin is warm and dry. No rash noted.   Cardiovascular: Normal heart rate noted  Respiratory: Normal respiratory effort, no problems with respiration noted  Abdomen: Soft, gravid, appropriate for gestational age. Pain/Pressure: Present     Pelvic:  Cervical exam deferred        Extremities: Normal range of motion.  Edema: None  Mental Status: Normal mood and affect. Normal behavior. Normal judgment and thought content.     Assessment   23 y.o. G1P0000 at [redacted]w[redacted]d, EDD 07/28/2019 by Ultrasound  presenting for a routine prenatal visit.  Plan   Pregnancy#1 Problems (from 09/28/18 to present)    Problem Noted Resolved   Supervision of normal first pregnancy 12/19/2018 by Will Bonnet, MD No   Overview Signed 01/02/2019  9:03 AM by Will Bonnet, MD    [facility] Prenatal Labs  Dating  Blood type: A/Positive/-- (02/25 1218)   Genetic Screen 1 Screen:    AFP:     Quad:     NIPS: Antibody:Negative (02/25 1218)  Anatomic [place]  Rubella: 2.70 (02/25 1218) Varicella: @VZVIGG @  GTT Early:               Third trimester:  RPR: Non Reactive (02/25 1218)   Rhogam  HBsAg: Negative (02/25 1218)   TDaP vaccine                       Flu Shot: HIV: Non Reactive (02/25 1218)   Baby Food                                GBS:   Contraception  Pap:  CBB     CS/VBAC    Support Person              Ultrasound follow up today for elevated fluid on anatomy scan. AFI 31.5 cm today, cephalic presentation, FHR 144 bpm. Results reviewed with patient.  Please refer to After Visit Summary for other counseling recommendations.   Return in about 2 weeks (around 06/04/2019) for [place].  Avel Sensor, CNM 05/21/2019  11:11 AM

## 2019-05-21 NOTE — Patient Instructions (Signed)
Third Trimester of Pregnancy The third trimester is from week 28 through week 40 (months 7 through 9). The third trimester is a time when the unborn baby (fetus) is growing rapidly. At the end of the ninth month, the fetus is about 20 inches in length and weighs 6-10 pounds. Body changes during your third trimester Your body will continue to go through many changes during pregnancy. The changes vary from woman to woman. During the third trimester:  Your weight will continue to increase. You can expect to gain 25-35 pounds (11-16 kg) by the end of the pregnancy.  You may begin to get stretch marks on your hips, abdomen, and breasts.  You may urinate more often because the fetus is moving lower into your pelvis and pressing on your bladder.  You may develop or continue to have heartburn. This is caused by increased hormones that slow down muscles in the digestive tract.  You may develop or continue to have constipation because increased hormones slow digestion and cause the muscles that push waste through your intestines to relax.  You may develop hemorrhoids. These are swollen veins (varicose veins) in the rectum that can itch or be painful.  You may develop swollen, bulging veins (varicose veins) in your legs.  You may have increased body aches in the pelvis, back, or thighs. This is due to weight gain and increased hormones that are relaxing your joints.  You may have changes in your hair. These can include thickening of your hair, rapid growth, and changes in texture. Some women also have hair loss during or after pregnancy, or hair that feels dry or thin. Your hair will most likely return to normal after your baby is born.  Your breasts will continue to grow and they will continue to become tender. A yellow fluid (colostrum) may leak from your breasts. This is the first milk you are producing for your baby.  Your belly button may stick out.  You may notice more swelling in your hands,  face, or ankles.  You may have increased tingling or numbness in your hands, arms, and legs. The skin on your belly may also feel numb.  You may feel short of breath because of your expanding uterus.  You may have more problems sleeping. This can be caused by the size of your belly, increased need to urinate, and an increase in your body's metabolism.  You may notice the fetus "dropping," or moving lower in your abdomen (lightening).  You may have increased vaginal discharge.  You may notice your joints feel loose and you may have pain around your pelvic bone. What to expect at prenatal visits You will have prenatal exams every 2 weeks until week 36. Then you will have weekly prenatal exams. During a routine prenatal visit:  You will be weighed to make sure you and the baby are growing normally.  Your blood pressure will be taken.  Your abdomen will be measured to track your baby's growth.  The fetal heartbeat will be listened to.  Any test results from the previous visit will be discussed.  You may have a cervical check near your due date to see if your cervix has softened or thinned (effaced).  You will be tested for Group B streptococcus. This happens between 35 and 37 weeks. Your health care provider may ask you:  What your birth plan is.  How you are feeling.  If you are feeling the baby move.  If you have had any abnormal   symptoms, such as leaking fluid, bleeding, severe headaches, or abdominal cramping.  If you are using any tobacco products, including cigarettes, chewing tobacco, and electronic cigarettes.  If you have any questions. Other tests or screenings that may be performed during your third trimester include:  Blood tests that check for low iron levels (anemia).  Fetal testing to check the health, activity level, and growth of the fetus. Testing is done if you have certain medical conditions or if there are problems during the pregnancy.  Nonstress test  (NST). This test checks the health of your baby to make sure there are no signs of problems, such as the baby not getting enough oxygen. During this test, a belt is placed around your belly. The baby is made to move, and its heart rate is monitored during movement. What is false labor? False labor is a condition in which you feel small, irregular tightenings of the muscles in the womb (contractions) that usually go away with rest, changing position, or drinking water. These are called Braxton Hicks contractions. Contractions may last for hours, days, or even weeks before true labor sets in. If contractions come at regular intervals, become more frequent, increase in intensity, or become painful, you should see your health care provider. What are the signs of labor?  Abdominal cramps.  Regular contractions that start at 10 minutes apart and become stronger and more frequent with time.  Contractions that start on the top of the uterus and spread down to the lower abdomen and back.  Increased pelvic pressure and dull back pain.  A watery or bloody mucus discharge that comes from the vagina.  Leaking of amniotic fluid. This is also known as your "water breaking." It could be a slow trickle or a gush. Let your health care provider know if it has a color or strange odor. If you have any of these signs, call your health care provider right away, even if it is before your due date. Follow these instructions at home: Medicines  Follow your health care provider's instructions regarding medicine use. Specific medicines may be either safe or unsafe to take during pregnancy.  Take a prenatal vitamin that contains at least 600 micrograms (mcg) of folic acid.  If you develop constipation, try taking a stool softener if your health care provider approves. Eating and drinking   Eat a balanced diet that includes fresh fruits and vegetables, whole grains, good sources of protein such as meat, eggs, or tofu,  and low-fat dairy. Your health care provider will help you determine the amount of weight gain that is right for you.  Avoid raw meat and uncooked cheese. These carry germs that can cause birth defects in the baby.  If you have low calcium intake from food, talk to your health care provider about whether you should take a daily calcium supplement.  Eat four or five small meals rather than three large meals a day.  Limit foods that are high in fat and processed sugars, such as fried and sweet foods.  To prevent constipation: ? Drink enough fluid to keep your urine clear or pale yellow. ? Eat foods that are high in fiber, such as fresh fruits and vegetables, whole grains, and beans. Activity  Exercise only as directed by your health care provider. Most women can continue their usual exercise routine during pregnancy. Try to exercise for 30 minutes at least 5 days a week. Stop exercising if you experience uterine contractions.  Avoid heavy lifting.  Do   not exercise in extreme heat or humidity, or at high altitudes.  Wear low-heel, comfortable shoes.  Practice good posture.  You may continue to have sex unless your health care provider tells you otherwise. Relieving pain and discomfort  Take frequent breaks and rest with your legs elevated if you have leg cramps or low back pain.  Take warm sitz baths to soothe any pain or discomfort caused by hemorrhoids. Use hemorrhoid cream if your health care provider approves.  Wear a good support bra to prevent discomfort from breast tenderness.  If you develop varicose veins: ? Wear support pantyhose or compression stockings as told by your healthcare provider. ? Elevate your feet for 15 minutes, 3-4 times a day. Prenatal care  Write down your questions. Take them to your prenatal visits.  Keep all your prenatal visits as told by your health care provider. This is important. Safety  Wear your seat belt at all times when driving.  Make  a list of emergency phone numbers, including numbers for family, friends, the hospital, and police and fire departments. General instructions  Avoid cat litter boxes and soil used by cats. These carry germs that can cause birth defects in the baby. If you have a cat, ask someone to clean the litter box for you.  Do not travel far distances unless it is absolutely necessary and only with the approval of your health care provider.  Do not use hot tubs, steam rooms, or saunas.  Do not drink alcohol.  Do not use any products that contain nicotine or tobacco, such as cigarettes and e-cigarettes. If you need help quitting, ask your health care provider.  Do not use any medicinal herbs or unprescribed drugs. These chemicals affect the formation and growth of the baby.  Do not douche or use tampons or scented sanitary pads.  Do not cross your legs for long periods of time.  To prepare for the arrival of your baby: ? Take prenatal classes to understand, practice, and ask questions about labor and delivery. ? Make a trial run to the hospital. ? Visit the hospital and tour the maternity area. ? Arrange for maternity or paternity leave through employers. ? Arrange for family and friends to take care of pets while you are in the hospital. ? Purchase a rear-facing car seat and make sure you know how to install it in your car. ? Pack your hospital bag. ? Prepare the baby's nursery. Make sure to remove all pillows and stuffed animals from the baby's crib to prevent suffocation.  Visit your dentist if you have not gone during your pregnancy. Use a soft toothbrush to brush your teeth and be gentle when you floss. Contact a health care provider if:  You are unsure if you are in labor or if your water has broken.  You become dizzy.  You have mild pelvic cramps, pelvic pressure, or nagging pain in your abdominal area.  You have lower back pain.  You have persistent nausea, vomiting, or diarrhea.   You have an unusual or bad smelling vaginal discharge.  You have pain when you urinate. Get help right away if:  Your water breaks before 37 weeks.  You have regular contractions less than 5 minutes apart before 37 weeks.  You have a fever.  You are leaking fluid from your vagina.  You have spotting or bleeding from your vagina.  You have severe abdominal pain or cramping.  You have rapid weight loss or weight gain.  You have   shortness of breath with chest pain.  You notice sudden or extreme swelling of your face, hands, ankles, feet, or legs.  Your baby makes fewer than 10 movements in 2 hours.  You have severe headaches that do not go away when you take medicine.  You have vision changes. Summary  The third trimester is from week 28 through week 40, months 7 through 9. The third trimester is a time when the unborn baby (fetus) is growing rapidly.  During the third trimester, your discomfort may increase as you and your baby continue to gain weight. You may have abdominal, leg, and back pain, sleeping problems, and an increased need to urinate.  During the third trimester your breasts will keep growing and they will continue to become tender. A yellow fluid (colostrum) may leak from your breasts. This is the first milk you are producing for your baby.  False labor is a condition in which you feel small, irregular tightenings of the muscles in the womb (contractions) that eventually go away. These are called Braxton Hicks contractions. Contractions may last for hours, days, or even weeks before true labor sets in.  Signs of labor can include: abdominal cramps; regular contractions that start at 10 minutes apart and become stronger and more frequent with time; watery or bloody mucus discharge that comes from the vagina; increased pelvic pressure and dull back pain; and leaking of amniotic fluid. This information is not intended to replace advice given to you by your health  care provider. Make sure you discuss any questions you have with your health care provider. Document Released: 10/05/2001 Document Revised: 02/01/2019 Document Reviewed: 11/16/2016 Elsevier Patient Education  2020 [employer]  

## 2019-06-04 ENCOUNTER — Other Ambulatory Visit: Payer: Self-pay

## 2019-06-04 ENCOUNTER — Ambulatory Visit (INDEPENDENT_AMBULATORY_CARE_PROVIDER_SITE_OTHER): Payer: Medicaid Other | Admitting: Obstetrics and Gynecology

## 2019-06-04 ENCOUNTER — Encounter: Payer: Self-pay | Admitting: Obstetrics and Gynecology

## 2019-06-04 VITALS — BP 118/74 | Wt 175.0 lb

## 2019-06-04 DIAGNOSIS — Z3A32 32 weeks gestation of pregnancy: Secondary | ICD-10-CM

## 2019-06-04 DIAGNOSIS — O9989 Other specified diseases and conditions complicating pregnancy, childbirth and the puerperium: Secondary | ICD-10-CM

## 2019-06-04 DIAGNOSIS — Z3403 Encounter for supervision of normal first pregnancy, third trimester: Secondary | ICD-10-CM

## 2019-06-04 DIAGNOSIS — R300 Dysuria: Secondary | ICD-10-CM

## 2019-06-04 NOTE — Progress Notes (Signed)
Routine Prenatal Care Visit  Subjective  Madison Dixon is a 23 y.o. G1P0000 at [redacted]w[redacted]d being seen today for ongoing prenatal care.  She is currently monitored for the following issues for this low-risk pregnancy and has Allergic rhinitis; Chronic pelvic pain in female; Endometriosis determined by laparoscopy; and Supervision of normal first pregnancy on their problem list.  ----------------------------------------------------------------------------------- Patient reports increase in discharge in the past week and increase in pain in her hips.    Contractions: Not present. Vag. Bleeding: None.  Movement: Present. Denies leaking of fluid.  ----------------------------------------------------------------------------------- The following portions of the patient's history were reviewed and updated as appropriate: allergies, current medications, past family history, past medical history, past social history, past surgical history and problem list. Problem list updated.   Objective  Blood pressure 118/74, weight 175 lb (79.4 kg), last menstrual period 09/28/2018. Pregravid weight 165 lb (74.8 kg) Total Weight Gain 10 lb (4.536 kg) Urinalysis: Urine Protein    Urine Glucose    Fetal Status: Fetal Heart Rate (bpm): 150 Fundal Height: 32 cm Movement: Present     General:  Alert, oriented and cooperative. Patient is in no acute distress.  Skin: Skin is warm and dry. No rash noted.   Cardiovascular: Normal heart rate noted  Respiratory: Normal respiratory effort, no problems with respiration noted  Abdomen: Soft, gravid, appropriate for gestational age. Pain/Pressure: Present     Pelvic:  Cervical exam performed Dilation: Closed Effacement (%): 40 Station: Ballotable  Extremities: Normal range of motion.  Edema: None  Mental Status: Normal mood and affect. Normal behavior. Normal judgment and thought content.   ROM Assessment: Pooling: negative Nitrazine: negative Ferning: negative  Wet  Prep: PH: 4.5 Clue Cells: Negative Fungal elements: Negative Trichomonas: Negative   Assessment   23 y.o. G1P0000 at [redacted]w[redacted]d by  07/28/2019, by Ultrasound presenting for routine prenatal visit  Plan   Pregnancy#1 Problems (from 09/28/18 to present)    Problem Noted Resolved   Supervision of normal first pregnancy 12/19/2018 by Will Bonnet, MD No   Overview Signed 01/02/2019  9:03 AM by Will Bonnet, MD    [facility] Prenatal Labs  Dating  Blood type: A/Positive/-- (02/25 1218)   Genetic Screen 1 Screen:    AFP:     Quad:     NIPS: Antibody:Negative (02/25 1218)  Anatomic [place]  Rubella: 2.70 (02/25 1218) Varicella: @VZVIGG @  GTT Early:               Third trimester:  RPR: Non Reactive (02/25 1218)   Rhogam  HBsAg: Negative (02/25 1218)   TDaP vaccine                       Flu Shot: HIV: Non Reactive (02/25 1218)   Baby Food                                GBS:   Contraception  Pap:  CBB     CS/VBAC    Support Person                Preterm labor symptoms and general obstetric precautions including but not limited to vaginal bleeding, contractions, leaking of fluid and fetal movement were reviewed in detail with the patient. Please refer to After Visit Summary for other counseling recommendations.   - declines TDaP - no evidence of infection or ROM - recommend belly band. - Urine  culture sent today  Return in about 2 weeks (around 06/18/2019) for Routine Prenatal Appointment.  Thomasene MohairStephen Jackson, MD, Merlinda FrederickFACOG [facility], [facility] 06/04/2019 3:25 PM

## 2019-06-04 NOTE — Progress Notes (Signed)
No vb. No lof. Pt having a lot of pressure and back pain . TDAP today

## 2019-06-05 DIAGNOSIS — R300 Dysuria: Secondary | ICD-10-CM | POA: Diagnosis not present

## 2019-06-05 DIAGNOSIS — Z3403 Encounter for supervision of normal first pregnancy, third trimester: Secondary | ICD-10-CM | POA: Diagnosis not present

## 2019-06-07 LAB — URINE CULTURE

## 2019-06-15 ENCOUNTER — Encounter: Payer: Self-pay | Admitting: Obstetrics and Gynecology

## 2019-06-15 ENCOUNTER — Other Ambulatory Visit: Payer: Self-pay

## 2019-06-15 ENCOUNTER — Observation Stay
Admission: EM | Admit: 2019-06-15 | Discharge: 2019-06-15 | Disposition: A | Discharge status: Home / Self Care | Payer: Medicaid Other | Attending: Certified Nurse Midwife | Admitting: Certified Nurse Midwife

## 2019-06-15 ENCOUNTER — Ambulatory Visit (INDEPENDENT_AMBULATORY_CARE_PROVIDER_SITE_OTHER): Payer: Medicaid Other | Admitting: Obstetrics and Gynecology

## 2019-06-15 VITALS — BP 122/78 | Wt 180.0 lb

## 2019-06-15 DIAGNOSIS — R109 Unspecified abdominal pain: Secondary | ICD-10-CM | POA: Diagnosis not present

## 2019-06-15 DIAGNOSIS — Z3A33 33 weeks gestation of pregnancy: Secondary | ICD-10-CM | POA: Diagnosis not present

## 2019-06-15 DIAGNOSIS — O26893 Other specified pregnancy related conditions, third trimester: Principal | ICD-10-CM | POA: Diagnosis present

## 2019-06-15 DIAGNOSIS — Z3403 Encounter for supervision of normal first pregnancy, third trimester: Secondary | ICD-10-CM

## 2019-06-15 DIAGNOSIS — Z3A34 34 weeks gestation of pregnancy: Secondary | ICD-10-CM | POA: Diagnosis not present

## 2019-06-15 LAB — URINALYSIS, COMPLETE (UACMP) WITH MICROSCOPIC
Bilirubin Urine: NEGATIVE
Glucose, UA: NEGATIVE mg/dL
Hgb urine dipstick: NEGATIVE
Ketones, ur: NEGATIVE mg/dL
Nitrite: NEGATIVE
Protein, ur: NEGATIVE mg/dL
Specific Gravity, Urine: 1.009 (ref 1.005–1.030)
pH: 7 (ref 5.0–8.0)

## 2019-06-15 MED ORDER — TERBUTALINE SULFATE 1 MG/ML IJ SOLN
0.2500 mg | Freq: Once | INTRAMUSCULAR | Status: AC
  Administered 2019-06-15: 0.25 mg via SUBCUTANEOUS
  Filled 2019-06-15: qty 1

## 2019-06-15 NOTE — Progress Notes (Signed)
Routine Prenatal Care Visit  Subjective  Madison Dixon is a 23 y.o. G1P0000 at 3672w6d being seen today for ongoing prenatal care.  She is currently monitored for the following issues for this low-risk pregnancy and has Allergic rhinitis; Chronic pelvic pain in female; Endometriosis determined by laparoscopy; and Supervision of normal first pregnancy on their problem list.  ----------------------------------------------------------------------------------- Patient reports acute onset upper abdominal pain since this morning. She believes the baby has turned "side-to-side" and this is causing her great pain. She had intercourse either this morning or last night. Denies vaginal bleeding, vaginal symptoms.  She denies fevers, chills, hematuria, hematochezia. She has had mild nausea, but no emesis.     Contractions: Not present. Vag. Bleeding: None.  Movement: Present. Denies leaking of fluid.  ----------------------------------------------------------------------------------- The following portions of the patient's history were reviewed and updated as appropriate: allergies, current medications, past family history, past medical history, past social history, past surgical history and problem list. Problem list updated.   Objective  Blood pressure 122/78, weight 180 lb (81.6 kg), last menstrual period 09/28/2018. Pregravid weight 165 lb (74.8 kg) Total Weight Gain 15 lb (6.804 kg) Urinalysis: Urine Protein    Urine Glucose    Fetal Status: Fetal Heart Rate (bpm): 133   Movement: Present  Presentation: Vertex  General:  Alert, oriented and cooperative. Patient is in no acute distress.  Skin: Skin is warm and dry. No rash noted.   Cardiovascular: Normal heart rate noted  Respiratory: Normal respiratory effort, no problems with respiration noted  Abdomen: Soft, gravid, appropriate for gestational age. Pain/Pressure: Present  Non-tender to palpation in upper abdomen or over uterus  Pelvic:   Cervical exam performed Dilation: Closed Effacement (%): 60 Station: Ballotable  Extremities: Normal range of motion.  Edema: None  Mental Status: Normal mood and affect. Normal behavior. Normal judgment and thought content.   Bedside u/s performed: Fetus is cephalic, spine maternal left Subjectively normal amnionitic fluid No apparent abruption of placenta.  Wet Prep: PH: 4.5 Clue Cells: Negative Fungal elements: Negative Trichomonas: Negative   Assessment   22 y.o. G1P0000 at 5472w6d by  07/28/2019, by Ultrasound presenting for work-in prenatal visit  Plan   Pregnancy#1 Problems (from 09/28/18 to present)    Problem Noted Resolved   Supervision of normal first pregnancy 12/19/2018 by Conard NovakJackson, Stephen D, MD No   Overview Signed 01/02/2019  9:03 AM by Conard NovakJackson, Stephen D, MD    [facility]inic [facility] Prenatal Labs  Dating  Blood type: A/Positive/-- (02/25 1218)   Genetic Screen 1 Screen:    AFP:     Quad:     NIPS: Antibody:Negative (02/25 1218)  Anatomic US  Rubella: 2.70 (02/25 1218) Varicella: @VZVIGG @  GTT Early:               Third trimester:  RPR: Non Reactive (02/25 1218)   Rhogam  HBsAg: Negative (02/25 1218)   TDaP vaccine                       Flu Shot: HIV: Non Reactive (02/25 1218)   Baby Food                                GBS:   Contraception  Pap:  CBB     CS/VBAC    Support Person                Preterm labor symptoms  and general obstetric precautions including but not limited to vaginal bleeding, contractions, leaking of fluid and fetal movement were reviewed in detail with the patient. Please refer to After Visit Summary for other counseling recommendations.   - Given her clear discomfort and symptoms, there is no obvious etiology I can identify in the clinic. I'm sending the patient to L&D for further evaluation.  L&D notified and the CNM on call notified with direct report via telephone.   Return in about 10 days (around 06/25/2019) for Keep previously  schedule prenatal appointment, dispo is to go to [facility] now for further investigation.  Prentice Docker, MD, [facility], [facility] 06/15/2019 12:36 PM

## 2019-06-15 NOTE — Final Progress Note (Signed)
Physician Final Progress Note  Patient ID: SHAGUN WORDELL MRN: [id number] DOB/AGE: 1996-02-20 23 y.o.  Admit date: 06/15/2019 Admitting provider: Malachy Mood, MD/ Jesus Genera. Danise Mina, CNM Discharge date: 06/21/2019   Admission Diagnoses: IUP at 33wk6days with abdominal pain   Discharge Diagnoses:   IUP at 33wk6d with threatened preterm labor.    Consults: None  Significant Findings/ Diagnostic Studies:   HPI:  ARIANIS BOWDITCH is a 23 y.o. G40P0000 female with EDC=07/28/2019 at [redacted]w[redacted]d dated by 7wk2d ultrasound.  Her pregnancy has been complicated by anxiety/depression and obesity. .  She presents to L&D from the office  for evaluation of abdominal pain. Dr Glennon Mac saw her as a work in appointment with the intermittent upper abdominal pain (fundal) since this AM. She denied vaginal bleeding or leakage of fluid. Did have intercourse either last night or this AM. Wet prep done in the office was negative.  Has not been able to feel the baby move as much with the abdominal pain.  Denies dysuria, hematuria. Has had some nausea this AM and had a loose stool last night and again this AM. No blood in stool.  Prenatal care site: Prenatal care at [facility] has been remarkable for  [facility] Prenatal Labs  Dating 7wk2d ultrasound Blood type: A/Positive/-- (02/25 1218)   Genetic Screen 1 Screen:    AFP:     Quad:     NIPS: diploid XX Antibody:Negative (02/25 1218)  Anatomic [place] Normal anatomy, anterior placenta Rubella: 2.70 (02/25 1218) Varicella: Non immune  GTT Early:               Third trimester: 126 RPR: Non Reactive (02/25 1218)   Rhogam NA HBsAg: Negative (02/25 1218)   TDaP vaccine     Declines                  Flu Shot: HIV: Non Reactive (02/25 1218)   Baby Food                                GBS:   Contraception  Pap:  CBB     CS/VBAC    Support Person            Maternal Medical History:   Past Medical History:  Diagnosis Date  . Anxiety   . Asthma    sports induced  . Bipolar disorder ([place])   . Chronic pelvic pain in female 09/09/2016  . Depression   . Endometriosis determined by laparoscopy 09/09/2016  . Ovarian cyst   . Pilonidal cyst   . PONV (postoperative nausea and vomiting)     Past Surgical History:  Procedure Laterality Date  . COLONOSCOPY WITH ESOPHAGOGASTRODUODENOSCOPY (EGD)    . LAPAROSCOPY N/A 09/09/2016   Procedure: LAPAROSCOPY DIAGNOSTIC, FULGERATION OF ENDOMETRIOSIS;  Surgeon: Will Bonnet, MD;  Location: [facility] ORS;  Service: Gynecology;  Laterality: N/A;  . PILONIDAL CYST EXCISION N/A 04/10/2015   Procedure: CYST EXCISION PILONIDAL EXTENSIVE;  Surgeon: Christene Lye, MD;  Location: [facility] ORS;  Service: General;  Laterality: N/A;    Allergies  Allergen Reactions  . Aripiprazole Nausea And Vomiting  . Vicodin [Hydrocodone-Acetaminophen] Nausea And Vomiting    Prior to Admission medications   Medication Sig Start Date End Date Taking? Authorizing Provider  Prenatal Vit-Fe Fumarate-FA (MULTIVITAMIN-PRENATAL) 27-0.8 MG TABS tablet Take 1 tablet by mouth daily at 12 noon.   Yes [provider]  Social History: She  reports that she has never smoked. She has never used smokeless tobacco. She reports previous alcohol use. She reports previous drug use. Drug: Marijuana.  Family History: family history includes GER disease in her father; Hyperlipidemia in her father.   Review of Systems: Negative x 10 systems reviewed except as noted in the HPI.      Physical Exam:  Vital Signs: BP 129/66 (BP Location: Left Arm)   Pulse 91   Temp 98.7 F (37.1 C) (Oral)   Resp 16   LMP 09/28/2018  General: no acute distress.  HEENT: normocephalic, atraumatic Heart: regular rate & rhythm.  No murmurs/rubs/gallops Lungs: clear to auscultation bilaterally Abdomen: soft, gravid, contraction palpated-tender to palpitation with contraction No RUQ pain Pelvic:   External: Normal external female  genitalia  Cervix: Dilation: Closed / Effacement (%): 60 / Station: -1 -no change from office after 2-3 hours  Extremities: non-tender, symmetric, trace edema bilaterally.   Neurologic: Alert & oriented x 3.     Baseline FHR: baseline initially 140s with accelerations to 160s to 170s. There was a change of baseline after terbutaline. Baseline then changed to 150s. Toco: Irregular contractions. Would have several in a row then space out then several more in a row.  After terbutaline given at 1433, the contractions spaced out and patient reported feeling more comfortable. Mostly uterine irritability noted after that.   A: Threatened preterm labor-Irregular contractions that responded to terbutaline. Patient now requesting discharge No cervical change Reactive fetal heart tracing  P: Follow up in office 06/19/19 Preterm labor precautions  Procedures: none  Discharge Condition: stable  Disposition:    Diet: Regular diet  Discharge Activity: Activity as tolerated   Allergies as of 06/15/2019      Reactions   Aripiprazole Nausea And Vomiting   Vicodin [hydrocodone-acetaminophen] Nausea And Vomiting      Medication List    TAKE these medications   multivitamin-prenatal 27-0.8 MG Tabs tablet Take 1 tablet by mouth daily at 12 noon.        Total time spent taking care of this patient: 15 minutes  Signed: Farrel Connersolleen Annete Ayuso

## 2019-06-19 ENCOUNTER — Observation Stay
Admission: EM | Admit: 2019-06-19 | Discharge: 2019-06-19 | Disposition: A | Discharge status: Home / Self Care | Payer: Medicaid Other | Attending: Obstetrics and Gynecology | Admitting: Obstetrics and Gynecology

## 2019-06-19 ENCOUNTER — Other Ambulatory Visit: Payer: Self-pay

## 2019-06-19 ENCOUNTER — Ambulatory Visit (INDEPENDENT_AMBULATORY_CARE_PROVIDER_SITE_OTHER): Payer: Medicaid Other | Admitting: Certified Nurse Midwife

## 2019-06-19 VITALS — BP 122/66 | Wt 178.0 lb

## 2019-06-19 DIAGNOSIS — Z3A34 34 weeks gestation of pregnancy: Secondary | ICD-10-CM | POA: Insufficient documentation

## 2019-06-19 DIAGNOSIS — O4703 False labor before 37 completed weeks of gestation, third trimester: Principal | ICD-10-CM | POA: Insufficient documentation

## 2019-06-19 DIAGNOSIS — O4700 False labor before 37 completed weeks of gestation, unspecified trimester: Secondary | ICD-10-CM | POA: Diagnosis present

## 2019-06-19 DIAGNOSIS — O479 False labor, unspecified: Secondary | ICD-10-CM | POA: Diagnosis present

## 2019-06-19 LAB — POCT URINALYSIS DIPSTICK OB
Glucose, UA: NEGATIVE
POC,PROTEIN,UA: NEGATIVE

## 2019-06-19 LAB — FETAL FIBRONECTIN: Fetal Fibronectin: NEGATIVE

## 2019-06-19 NOTE — Progress Notes (Signed)
Follow up visit from hospital observation 8/21 for contractions/ abdominal pain. Patient was contracting irregularity and was treated with terbutaline, with resolution of contractions. Her cervix remained closed.  The contractions returned after discharge from hospital, but over the last 24 hours have gotten more frequent and more uncomfortable. Could not sleep last night due to contractions with feeling pain in lower abdomen. . She thinks contractions are now about 5 minutes apart. She has a hard time feeling baby move with the contractions. Denies vaginal bleeding or leakage of water. Has had more discharge Exam: appears uncomfortable/ tired FH 31cm, FHT 147 External/BUS: no lesions or inflammation Vagina: small amount mucoid discharge Cervix: closed/ 60%/-1  Wet prep negative for Trichimonas, clue cells, or hyphae  A: IUP at 34wk3d with threatened preterm labor  P: To L&D for monitoring, possible betamethasone and tocolytic (?procardia) FU next week as scheduled if [place], CNM

## 2019-06-19 NOTE — OB Triage Note (Signed)
Pt sent over from [facility] with c/o ctx. Pt was seen in [facility] Friday with preterm ctx and treated with terbutaline then discharged home. Pt states the ctx started back again Friday night at home but she didn't know they were ctx at the time. Pt denies LOF, VB and states positive FM. Monitors applied and initial fht 145. VSS. Will continue to monitor.

## 2019-06-19 NOTE — OB Triage Note (Signed)
Provider spoke with pt regarding lab results. Discharge instructions reviewed with the patient. Pt verbalized understanding of instructions and understands when to return for further evaluation. Pt has no further questions at this time and is discharged home in stable condition with her husband.

## 2019-06-19 NOTE — Progress Notes (Signed)
C/o pt is VERY uncomfortable d/t irreg ctxs.rj

## 2019-06-19 NOTE — Discharge Summary (Signed)
Physician Discharge Summary   Patient ID: Madison Dixon [id number] 23 y.o. 1996-09-01  Admit date: 06/19/2019  Discharge date and time: No discharge date for patient encounter.   Admitting Physician: Adrian Prows MD  Discharge Physician: Adrian Prows MD  Admission Diagnoses: 34 weeks preg contractions  Discharge Diagnoses: Braxton-hicks contractions  Admission Condition: good  Discharged Condition: good  Indication for Admission: Evaluation of contractions  Hospital Course: Patient sent for evaluation from the officebecause of contractions which have been persistent and painful for several days. She was seen today in the clinic and SVE showed that she was closed which is unchanged from Friday. She denies abnormal discharge. She reports that the contractions come and go. Sometimes they are every five minutes some times they are very infrequent. There can be several hours between contractions. She has had some trouble  sleeping. She reports that she is not working and that she can mostly stay at home. She has not been very active. She reports that the contractions do seem to occur when she is more active. She denies leakage of fluid. She denies vaginal bleeding. She declines a repeat cervical examination.  FFN was sent ans was negative. No contractions seen on the monitor. Fetus was reactive, Category I tracing. She was discharge home in stable condition.   Consults: None  Significant Diagnostic Studies: labs: FFN negative  Treatments: none  Discharge Exam: BP 114/67   Pulse 85   Temp 97.8 F (36.6 C) (Oral)   Resp 14   Ht 5\' 2"  (1.575 m)   Wt 80.7 kg   LMP 09/28/2018   BMI 32.56 kg/m   General Appearance:    Alert, cooperative, no distress, appears stated age  Head:    Normocephalic, without obvious abnormality, atraumatic  Eyes:    PERRL, conjunctiva/corneas clear, EOM's intact, fundi    benign, both eyes  Ears:    Normal TM's and external ear canals,  both ears  Nose:   Nares normal, septum midline, mucosa normal, no drainage    or sinus tenderness  Throat:   Lips, mucosa, and tongue normal; teeth and gums normal  Neck:   Supple, symmetrical, trachea midline, no adenopathy;    thyroid:  no enlargement/tenderness/nodules; no carotid   bruit or JVD  Back:     Symmetric, no curvature, ROM normal, no CVA tenderness  Lungs:     Clear to auscultation bilaterally, respirations unlabored  Chest Wall:    No tenderness or deformity   Heart:    Regular rate and rhythm, S1 and S2 normal, no murmur, rub   or gallop  Breast Exam:    No tenderness, masses, or nipple abnormality  Abdomen:     Soft, non-tender, bowel sounds active all four quadrants,    no masses, no organomegaly  Genitalia:    Normal female without lesion, discharge or tenderness  Rectal:    Normal tone, normal prostate, no masses or tenderness;   guaiac negative stool  Extremities:   Extremities normal, atraumatic, no cyanosis or edema  Pulses:   2+ and symmetric all extremities  Skin:   Skin color, texture, turgor normal, no rashes or lesions  Lymph nodes:   Cervical, supraclavicular, and axillary nodes normal  Neurologic:   CNII-XII intact, normal strength, sensation and reflexes    throughout    Disposition: Discharge disposition: 01-Home or Self Care       Patient Instructions:  Allergies as of 06/19/2019      Reactions   Aripiprazole  Nausea And Vomiting   Vicodin [hydrocodone-acetaminophen] Nausea And Vomiting      Medication List    TAKE these medications   multivitamin-prenatal 27-0.8 MG Tabs tablet Take 1 tablet by mouth daily at 12 noon.      Activity: activity as tolerated Diet: regular diet Wound Care: none needed  Follow-up with [facility] in 1 week  .  Signed: Natale MilchChristanna R Tahj Njoku 06/19/2019 5:40 PM

## 2019-06-25 ENCOUNTER — Ambulatory Visit (INDEPENDENT_AMBULATORY_CARE_PROVIDER_SITE_OTHER): Payer: Medicaid Other | Admitting: Obstetrics and Gynecology

## 2019-06-25 ENCOUNTER — Other Ambulatory Visit: Payer: Self-pay

## 2019-06-25 ENCOUNTER — Telehealth: Payer: Self-pay

## 2019-06-25 ENCOUNTER — Other Ambulatory Visit (HOSPITAL_COMMUNITY)
Admission: RE | Admit: 2019-06-25 | Discharge: 2019-06-25 | Disposition: A | Discharge status: Home / Self Care | Payer: Medicaid Other | Source: Ambulatory Visit | Attending: Obstetrics and Gynecology | Admitting: Obstetrics and Gynecology

## 2019-06-25 ENCOUNTER — Encounter: Payer: Self-pay | Admitting: Obstetrics and Gynecology

## 2019-06-25 VITALS — BP 110/68 | Wt 178.0 lb

## 2019-06-25 DIAGNOSIS — Z3A35 35 weeks gestation of pregnancy: Secondary | ICD-10-CM | POA: Insufficient documentation

## 2019-06-25 DIAGNOSIS — O479 False labor, unspecified: Secondary | ICD-10-CM | POA: Diagnosis not present

## 2019-06-25 DIAGNOSIS — O26893 Other specified pregnancy related conditions, third trimester: Secondary | ICD-10-CM

## 2019-06-25 DIAGNOSIS — Z3403 Encounter for supervision of normal first pregnancy, third trimester: Secondary | ICD-10-CM | POA: Insufficient documentation

## 2019-06-25 DIAGNOSIS — O4700 False labor before 37 completed weeks of gestation, unspecified trimester: Secondary | ICD-10-CM

## 2019-06-25 DIAGNOSIS — R109 Unspecified abdominal pain: Secondary | ICD-10-CM

## 2019-06-25 LAB — POCT URINALYSIS DIPSTICK OB
Glucose, UA: NEGATIVE
POC,PROTEIN,UA: NEGATIVE

## 2019-06-25 NOTE — Telephone Encounter (Signed)
Advise

## 2019-06-25 NOTE — Telephone Encounter (Signed)
I meant to let her know that a cervical exam will cause a little spotting. She shouldn't be bleeding heavily. A cervical exam can also temporarily cause some contractions. Those should become less, too.

## 2019-06-25 NOTE — Progress Notes (Addendum)
Routine Prenatal Care Visit  Subjective  Madison Dixon is a 23 y.o. G1P0000 at [redacted]w[redacted]d being seen today for ongoing prenatal care.  She is currently monitored for the following issues for this low-risk pregnancy and has Allergic rhinitis; Chronic pelvic pain in female; Endometriosis determined by laparoscopy; Supervision of normal first pregnancy; Abdominal pain during pregnancy in third trimester; and Premature uterine contractions on their problem list.  ----------------------------------------------------------------------------------- Patient reports irregular, but frequent and painful ctx.   Contractions: Irregular. Vag. Bleeding: None.  Movement: Present. Leaking Fluid denies.  ----------------------------------------------------------------------------------- The following portions of the patient's history were reviewed and updated as appropriate: allergies, current medications, past family history, past medical history, past social history, past surgical history and problem list. Problem list updated.  Objective  Blood pressure 110/68, weight 178 lb (80.7 kg), last menstrual period 09/28/2018. Pregravid weight 165 lb (74.8 kg) Total Weight Gain 13 lb (5.897 kg) Urinalysis: Urine Protein Negative  Urine Glucose Negative  Fetal Status: Fetal Heart Rate (bpm): 145 Fundal Height: 36 cm Movement: Present     General:  Alert, oriented and cooperative. Patient is in no acute distress.  Skin: Skin is warm and dry. No rash noted.   Cardiovascular: Normal heart rate noted  Respiratory: Normal respiratory effort, no problems with respiration noted  Abdomen: Soft, gravid, appropriate for gestational age. Pain/Pressure: Present     Pelvic:  Cervical exam performed Dilation: Fingertip Effacement (%): 60 Station: -1  Extremities: Normal range of motion.     Mental Status: Normal mood and affect. Normal behavior. Normal judgment and thought content.   Assessment   23 y.o. G1P0000 at [redacted]w[redacted]d by   07/28/2019, by Ultrasound presenting for routine prenatal visit  Plan   Pregnancy#1 Problems (from 09/28/18 to present)    Problem Noted Resolved   Supervision of normal first pregnancy 12/19/2018 by Will Bonnet, MD No   Overview Addendum 06/21/2019  1:22 AM by Dalia Heading, [place] Prenatal Labs  Dating  Blood type: A/Positive/-- (02/25 1218)   Genetic Screen 1 Screen:    AFP:     Quad:     NIPS: Antibody:Negative (02/25 1218)  Anatomic [place]  Rubella: 2.70 (02/25 1218) Varicella: @VZVIGG @  GTT Early:               Third trimester:  RPR: Non Reactive (02/25 1218)   Rhogam  HBsAg: Negative (02/25 1218)   TDaP vaccine                       Flu Shot: HIV: Non Reactive (02/25 1218)   Baby Food                                GBS:   Contraception  Pap:  CBB     CS/VBAC    Support Person                Preterm labor symptoms and general obstetric precautions including but not limited to vaginal bleeding, contractions, leaking of fluid and fetal movement were reviewed in detail with the patient. Please refer to After Visit Summary for other counseling recommendations.   - DIscussed sx.  Discussed steroids in case baby comes in the next 7 days. It is not likely, but still possible.  Mutual decision to not give steroids was made between me and patient given her current symptoms.  - GBS/Aptima today  Return in about 1 week (around 07/02/2019) for Routine Prenatal Appointment.  Thomasene MohairStephen Jackson, MD, Merlinda FrederickFACOG [facility], [facility] 06/25/2019 1:27 PM

## 2019-06-25 NOTE — Telephone Encounter (Signed)
Pt called worried about bleeding, she states she had her cervix checked today, I advised pt the bleeding was from the cervical check, She should just monitor the bleeding and the contractions, shes not sure how close they are but they have gotten stronger, hasn't timed them, pt want advice from [facility].

## 2019-06-25 NOTE — Addendum Note (Signed)
Addended by: Prentice Docker D on: 06/25/2019 01:40 PM   Modules accepted: Orders

## 2019-06-25 NOTE — Telephone Encounter (Signed)
Patient is calling to follow up with question left earlier. Please advise

## 2019-06-25 NOTE — Telephone Encounter (Signed)
Pt aware.

## 2019-06-27 LAB — CERVICOVAGINAL ANCILLARY ONLY
Chlamydia: NEGATIVE
Neisseria Gonorrhea: NEGATIVE

## 2019-06-27 LAB — STREP GP B NAA: Strep Gp B NAA: NEGATIVE

## 2019-07-04 ENCOUNTER — Other Ambulatory Visit: Payer: Self-pay

## 2019-07-04 ENCOUNTER — Ambulatory Visit (INDEPENDENT_AMBULATORY_CARE_PROVIDER_SITE_OTHER): Payer: Medicaid Other | Admitting: Certified Nurse Midwife

## 2019-07-04 VITALS — BP 126/70 | Wt 180.0 lb

## 2019-07-04 DIAGNOSIS — Z3A36 36 weeks gestation of pregnancy: Secondary | ICD-10-CM

## 2019-07-04 DIAGNOSIS — Z3403 Encounter for supervision of normal first pregnancy, third trimester: Secondary | ICD-10-CM

## 2019-07-04 LAB — POCT URINALYSIS DIPSTICK OB: Glucose, UA: NEGATIVE

## 2019-07-04 NOTE — Progress Notes (Signed)
No problems.  Ready Set Baby info given.rj

## 2019-07-10 ENCOUNTER — Encounter: Payer: Self-pay | Admitting: Obstetrics and Gynecology

## 2019-07-10 ENCOUNTER — Ambulatory Visit (INDEPENDENT_AMBULATORY_CARE_PROVIDER_SITE_OTHER): Payer: Medicaid Other | Admitting: Obstetrics and Gynecology

## 2019-07-10 ENCOUNTER — Other Ambulatory Visit: Payer: Self-pay

## 2019-07-10 VITALS — BP 122/74 | Wt 179.0 lb

## 2019-07-10 DIAGNOSIS — O479 False labor, unspecified: Secondary | ICD-10-CM

## 2019-07-10 DIAGNOSIS — Z3403 Encounter for supervision of normal first pregnancy, third trimester: Secondary | ICD-10-CM

## 2019-07-10 DIAGNOSIS — Z3A37 37 weeks gestation of pregnancy: Secondary | ICD-10-CM

## 2019-07-10 DIAGNOSIS — O4700 False labor before 37 completed weeks of gestation, unspecified trimester: Secondary | ICD-10-CM

## 2019-07-10 DIAGNOSIS — O4703 False labor before 37 completed weeks of gestation, third trimester: Secondary | ICD-10-CM

## 2019-07-10 NOTE — Progress Notes (Signed)
  Routine Prenatal Care Visit  Subjective  Madison Dixon is a 23 y.o. G1P0000 at [redacted]w[redacted]d being seen today for ongoing prenatal care.  She is currently monitored for the following issues for this low-risk pregnancy and has Allergic rhinitis; Chronic pelvic pain in female; Endometriosis determined by laparoscopy; Supervision of normal first pregnancy; Abdominal pain during pregnancy in third trimester; and Premature uterine contractions on their problem list.  ----------------------------------------------------------------------------------- Patient reports occasional contractions.   Contractions: Irregular. Vag. Bleeding: None.  Movement: Present. Leaking Fluid denies.  ----------------------------------------------------------------------------------- The following portions of the patient's history were reviewed and updated as appropriate: allergies, current medications, past family history, past medical history, past social history, past surgical history and problem list. Problem list updated.  Objective  Blood pressure 122/74, weight 179 lb (81.2 kg), last menstrual period 09/28/2018. Pregravid weight 165 lb (74.8 kg) Total Weight Gain 14 lb (6.35 kg) Urinalysis: Urine Protein    Urine Glucose    Fetal Status: Fetal Heart Rate (bpm): 145 Fundal Height: 36 cm Movement: Present  Presentation: Vertex  General:  Alert, oriented and cooperative. Patient is in no acute distress.  Skin: Skin is warm and dry. No rash noted.   Cardiovascular: Normal heart rate noted  Respiratory: Normal respiratory effort, no problems with respiration noted  Abdomen: Soft, gravid, appropriate for gestational age. Pain/Pressure: Present     Pelvic:  Cervical exam performed Dilation: 2 Effacement (%): 70 Station: -1  Extremities: Normal range of motion.     Mental Status: Normal mood and affect. Normal behavior. Normal judgment and thought content.   Assessment   23 y.o. G1P0000 at [redacted]w[redacted]d by  07/28/2019, by  Ultrasound presenting for routine prenatal visit  Plan   Pregnancy#1 Problems (from 09/28/18 to present)    Problem Noted Resolved   Supervision of normal first pregnancy 12/19/2018 by Will Bonnet, MD No   Overview Addendum 06/21/2019  1:22 AM by Dalia Heading, [place] Prenatal Labs  Dating  Blood type: A/Positive/-- (02/25 1218)   Genetic Screen 1 Screen:    AFP:     Quad:     NIPS: Antibody:Negative (02/25 1218)  Anatomic [place]  Rubella: 2.70 (02/25 1218) Varicella: @VZVIGG @  GTT Early:               Third trimester:  RPR: Non Reactive (02/25 1218)   Rhogam  HBsAg: Negative (02/25 1218)   TDaP vaccine                       Flu Shot: HIV: Non Reactive (02/25 1218)   Baby Food                                GBS:   Contraception  Pap:  CBB     CS/VBAC    Support Person                Term labor symptoms and general obstetric precautions including but not limited to vaginal bleeding, contractions, leaking of fluid and fetal movement were reviewed in detail with the patient. Please refer to After Visit Summary for other counseling recommendations.   Return in about 1 week (around 07/17/2019) for Routine Prenatal Appointment.  Prentice Docker, MD, [facility], [facility] 07/10/2019 11:47 AM

## 2019-07-11 ENCOUNTER — Observation Stay
Admission: EM | Admit: 2019-07-11 | Discharge: 2019-07-11 | Disposition: A | Discharge status: Home / Self Care | Payer: Medicaid Other | Attending: Maternal Newborn | Admitting: Maternal Newborn

## 2019-07-11 ENCOUNTER — Other Ambulatory Visit: Payer: Self-pay

## 2019-07-11 DIAGNOSIS — O26853 Spotting complicating pregnancy, third trimester: Secondary | ICD-10-CM | POA: Diagnosis not present

## 2019-07-11 DIAGNOSIS — Z3A37 37 weeks gestation of pregnancy: Secondary | ICD-10-CM | POA: Diagnosis not present

## 2019-07-11 DIAGNOSIS — Z885 Allergy status to narcotic agent status: Secondary | ICD-10-CM | POA: Diagnosis not present

## 2019-07-11 DIAGNOSIS — O26893 Other specified pregnancy related conditions, third trimester: Secondary | ICD-10-CM | POA: Diagnosis not present

## 2019-07-11 DIAGNOSIS — Z888 Allergy status to other drugs, medicaments and biological substances status: Secondary | ICD-10-CM | POA: Diagnosis not present

## 2019-07-11 DIAGNOSIS — N898 Other specified noninflammatory disorders of vagina: Secondary | ICD-10-CM | POA: Diagnosis not present

## 2019-07-11 DIAGNOSIS — Z3403 Encounter for supervision of normal first pregnancy, third trimester: Secondary | ICD-10-CM

## 2019-07-11 DIAGNOSIS — O471 False labor at or after 37 completed weeks of gestation: Secondary | ICD-10-CM | POA: Diagnosis not present

## 2019-07-11 LAB — URINALYSIS, COMPLETE (UACMP) WITH MICROSCOPIC
Bacteria, UA: NONE SEEN
Bilirubin Urine: NEGATIVE
Glucose, UA: NEGATIVE mg/dL
Ketones, ur: NEGATIVE mg/dL
Leukocytes,Ua: NEGATIVE
Nitrite: NEGATIVE
Protein, ur: NEGATIVE mg/dL
Specific Gravity, Urine: 1.01 (ref 1.005–1.030)
pH: 6 (ref 5.0–8.0)

## 2019-07-11 NOTE — OB Triage Note (Signed)
Patient came in for observation for labor evaluation. Patient reports uterine contractions every four minutes since 1930. Patient rates pain 4/10. Patient reports +FM. Patient denies leaking of fluid but reports vaginal bleeding and brown discharge. Vital signs stable and patient afebrile. FHR baseline 140 with moderate variability with accelerations 15 x 15 and no decelerations. Significant other at bedside. Will continue to monitor.

## 2019-07-11 NOTE — Discharge Summary (Signed)
Physician Final Progress Note  Patient ID: Madison Dixon MRN: [id number] DOB/AGE: 1996-10-09 23 y.o.  Admit date: 07/11/2019 Admitting provider: Nadara Mustardobert P Harris, MD Discharge date: 07/11/2019   Admission Diagnoses: Contractions, vaginal discharge  Discharge Diagnoses: Same  History of Present Illness: The patient is a 23 y.o. female G1P0000 at 1665w4d who presents for contractions that began a couple of hours ago and were around every 4 minutes at home, has had these symptoms every night for about a week. Pain rated as 4/10.  Also having some vaginal spotting, dark brown in color. Cervical exam in the office yesterday. No leaking of fluid, positive fetal movement.  Review of Systems: Review of systems negative unless otherwise noted in HPI.   Past Medical History:  Diagnosis Date  . Anxiety   . Asthma    sports induced  . Bipolar disorder (HCC)   . Chronic pelvic pain in female 09/09/2016  . Depression   . Endometriosis determined by laparoscopy 09/09/2016  . Ovarian cyst   . Pilonidal cyst   . PONV (postoperative nausea and vomiting)     Past Surgical History:  Procedure Laterality Date  . COLONOSCOPY WITH ESOPHAGOGASTRODUODENOSCOPY (EGD)    . LAPAROSCOPY N/A 09/09/2016   Procedure: LAPAROSCOPY DIAGNOSTIC, FULGERATION OF ENDOMETRIOSIS;  Surgeon: Conard NovakStephen D Jackson, MD;  Location: [facility] ORS;  Service: Gynecology;  Laterality: N/A;  . PILONIDAL CYST EXCISION N/A 04/10/2015   Procedure: CYST EXCISION PILONIDAL EXTENSIVE;  Surgeon: Kieth BrightlySeeplaputhur G Sankar, MD;  Location: [facility] ORS;  Service: General;  Laterality: N/A;    No current facility-administered medications on file prior to encounter.    Current Outpatient Medications on File Prior to Encounter  Medication Sig Dispense Refill  . Prenatal Vit-Fe Fumarate-FA (MULTIVITAMIN-PRENATAL) 27-0.8 MG TABS tablet Take 1 tablet by mouth daily at 12 noon.      Allergies  Allergen Reactions  . Aripiprazole Nausea And Vomiting  .  Vicodin [Hydrocodone-Acetaminophen] Nausea And Vomiting    Social History   Socioeconomic History  . Marital status: Married    Spouse name: Brett CanalesSteve  . Number of children: Not on file  . Years of education: Not on file  . Highest education level: Not on file  Occupational History  . Not on file  Social Needs  . Financial resource strain: Not hard at all  . Food insecurity    Worry: Never true    Inability: Never true  . Transportation needs    Medical: No    Non-medical: No  Tobacco Use  . Smoking status: Never Smoker  . Smokeless tobacco: Never Used  Substance and Sexual Activity  . Alcohol use: Not Currently    Alcohol/week: 0.0 standard drinks    Comment: drinks on friday nights  . Drug use: Not Currently    Types: Marijuana  . Sexual activity: Yes    Birth control/protection: None    Comment: undecided  Lifestyle  . Physical activity    Days per week: 3 days    Minutes per session: 20 min  . Stress: Not at all  Relationships  . Social Musicianconnections    Talks on phone: Three times a week    Gets together: Once a week    Attends religious service: 1 to 4 times per year    Active member of club or organization: No    Attends meetings of clubs or organizations: Never    Relationship status: Married  . Intimate partner violence    Fear of current or ex partner:  No    Emotionally abused: No    Physically abused: No    Forced sexual activity: No  Other Topics Concern  . Not on file  Social History Narrative  . Not on file    Family History  Problem Relation Age of Onset  . Hyperlipidemia Father   . GER disease Father      Physical Exam: BP 120/86 (BP Location: Right Arm)   Pulse 80   Temp 97.6 F (36.4 C) (Oral)   Resp 17   Ht 5\' 2"  (1.575 m)   Wt 81.2 kg   LMP 09/28/2018   BMI 32.74 kg/m   Gen: NAD Pelvic: 1.5/70/Ballotable with slight bloody show (RN exam), unchanged after one hour  EFM: Baseline 130 bpm, moderate,variability, accelerations  present, decelerations absent  Toco: Irregular contractions and uterine irritability  Significant Findings/ Diagnostic Studies: labs: UA negative for nitrites and leukocytes, Hgb in sample explained by vaginal exam  Procedures: NST  Discharge Condition: good  Disposition: Discharge disposition: 01-Home or Self Care       Diet: Regular diet  Discharge Activity: Activity as tolerated  Discharge Instructions    Discharge activity:  No Restrictions   Complete by: As directed    Discharge diet:  No restrictions   Complete by: As directed    Fetal Kick Count:  Lie on our left side for one hour after a meal, and count the number of times your baby kicks.  If it is less than 5 times, get up, move around and drink some juice.  Repeat the test 30 minutes later.  If it is still less than 5 kicks in an hour, notify your doctor.   Complete by: As directed    LABOR:  When conractions begin, you should start to time them from the beginning of one contraction to the beginning  of the next.  When contractions are 5 - 10 minutes apart or less and have been regular for at least an hour, you should call your health care provider.   Complete by: As directed    No sexual activity restrictions   Complete by: As directed    Notify physician for bleeding from the vagina   Complete by: As directed    Notify physician for blurring of vision or spots before the eyes   Complete by: As directed    Notify physician for chills or fever   Complete by: As directed    Notify physician for fainting spells, "black outs" or loss of consciousness   Complete by: As directed    Notify physician for increase in vaginal discharge   Complete by: As directed    Notify physician for leaking of fluid   Complete by: As directed    Notify physician for pain or burning when urinating   Complete by: As directed    Notify physician for pelvic pressure (sudden increase)   Complete by: As directed    Notify physician for  severe or continued nausea or vomiting   Complete by: As directed    Notify physician for sudden gushing of fluid from the vagina (with or without continued leaking)   Complete by: As directed    Notify physician for sudden, constant, or occasional abdominal pain   Complete by: As directed    Notify physician if baby moving less than usual   Complete by: As directed      Allergies as of 07/11/2019      Reactions   Aripiprazole Nausea And  Vomiting   Vicodin [hydrocodone-acetaminophen] Nausea And Vomiting      Medication List    TAKE these medications   multivitamin-prenatal 27-0.8 MG Tabs tablet Take 1 tablet by mouth daily at 12 noon.      Patient presented for evaluation of labor and had a cervical exam by the RN, unchanged after observation. I reviewed her vital signs and fetal tracing, both of which were reassuring.  Reactive NST. Patient reported that contractions had spaced out and lessened in intensity, consistent with previous evenings recently. Patient was discharged as she was not laboring.  Signed: Oswaldo Conroy, CNM  07/11/2019, 11:00 PM

## 2019-07-12 ENCOUNTER — Telehealth: Payer: Self-pay

## 2019-07-12 NOTE — Telephone Encounter (Signed)
Pt aware, instructions given and repeated back to me.

## 2019-07-12 NOTE — Telephone Encounter (Signed)
Pt states Dr, Glennon Mac had mentioned her being induced. She had originally declined but now wants to be induced. What does she need to do?

## 2019-07-12 NOTE — Telephone Encounter (Signed)
I set her up for coming in at 12:01 AM on 9/29  (midnight at the end of the day of 9/28. So, very early Tuesday morning).   Let me know, if this doesn't work for her.  I can answer more questions about there IOL, if she has them.

## 2019-07-14 NOTE — Progress Notes (Signed)
ROB at 36wk4d: Baby active. Having irregular contractions and increased pelvic pressure. No vaginal bleeding or LOF GBS and Aptima were negative on 8/31 Cervix: 1.5/75%/-1/ vertex Labor precautions [place] instructions

## 2019-07-17 ENCOUNTER — Other Ambulatory Visit: Payer: Self-pay

## 2019-07-17 ENCOUNTER — Other Ambulatory Visit (INDEPENDENT_AMBULATORY_CARE_PROVIDER_SITE_OTHER): Payer: Medicaid Other

## 2019-07-17 ENCOUNTER — Other Ambulatory Visit: Payer: Self-pay | Admitting: Obstetrics and Gynecology

## 2019-07-17 ENCOUNTER — Encounter: Payer: Self-pay | Admitting: Obstetrics and Gynecology

## 2019-07-17 ENCOUNTER — Ambulatory Visit (INDEPENDENT_AMBULATORY_CARE_PROVIDER_SITE_OTHER): Payer: Medicaid Other | Admitting: Obstetrics and Gynecology

## 2019-07-17 VITALS — BP 130/78 | Wt 185.0 lb

## 2019-07-17 DIAGNOSIS — Z3A38 38 weeks gestation of pregnancy: Secondary | ICD-10-CM | POA: Diagnosis not present

## 2019-07-17 DIAGNOSIS — O26843 Uterine size-date discrepancy, third trimester: Secondary | ICD-10-CM

## 2019-07-17 DIAGNOSIS — O368130 Decreased fetal movements, third trimester, not applicable or unspecified: Secondary | ICD-10-CM

## 2019-07-17 DIAGNOSIS — Z3403 Encounter for supervision of normal first pregnancy, third trimester: Secondary | ICD-10-CM

## 2019-07-17 NOTE — Progress Notes (Signed)
ROB C/o contractions, pelvic pain and pressure Denies lof, no vb, Good FM

## 2019-07-17 NOTE — Progress Notes (Signed)
Routine Prenatal Care Visit  Subjective  Madison Dixon is a 23 y.o. G1P0000 at [redacted]w[redacted]d being seen today for ongoing prenatal care.  She is currently monitored for the following issues for this low-risk pregnancy and has Allergic rhinitis; Chronic pelvic pain in female; Endometriosis determined by laparoscopy; Supervision of normal first pregnancy; Abdominal pain during pregnancy in third trimester; Premature uterine contractions; and Indication for care in labor and delivery, antepartum on their problem list.  ----------------------------------------------------------------------------------- Patient reports uterine contractions. She also reports decrease fetal movement for the last 2-3 days. She has not had lunch today and it is 4pm.  Contractions: Irregular. Vag. Bleeding: Bloody Show.  Movement: (!) Decreased. Denies leaking of fluid.  ----------------------------------------------------------------------------------- The following portions of the patient's history were reviewed and updated as appropriate: allergies, current medications, past family history, past medical history, past social history, past surgical history and problem list. Problem list updated.   Objective  Blood pressure 130/78, weight 185 lb (83.9 kg), last menstrual period 09/28/2018. Pregravid weight 165 lb (74.8 kg) Total Weight Gain 20 lb (9.072 kg) Urinalysis:      Fetal Status: Fetal Heart Rate (bpm): 135 Fundal Height: 35 cm Movement: (!) Decreased  Presentation: Vertex  General:  Alert, oriented and cooperative. Patient is in no acute distress.  Skin: Skin is warm and dry. No rash noted.   Cardiovascular: Normal heart rate noted  Respiratory: Normal respiratory effort, no problems with respiration noted  Abdomen: Soft, gravid, appropriate for gestational age. Pain/Pressure: Present     Pelvic:  Cervical exam performed Dilation: 2 Effacement (%): 70 Station: -2  Extremities: Normal range of motion.  Edema:  Trace  Mental Status: Normal mood and affect. Normal behavior. Normal judgment and thought content.    Assessment   23 y.o. G1P0000 at [redacted]w[redacted]d by  07/28/2019, by Ultrasound presenting for routine prenatal visit  Plan   Pregnancy#1 Problems (from 09/28/18 to present)    Problem Noted Resolved   Supervision of normal first pregnancy 12/19/2018 by Will Bonnet, MD No   Overview Addendum 07/14/2019  7:15 PM by Dalia Heading, [place] Prenatal Labs  Dating 7wk2d ultrasound Blood type: A/Positive/-- (02/25 1218)   Genetic Screen 1 Screen:    AFP:     Quad:     NIPS: diploid XX Antibody:Negative (02/25 1218)  Anatomic [place] Normal, anterior placenta Rubella: 2.70 (02/25 1218) Varicella:Nonimmune  GTT Early:               Third trimester: 126 RPR: Non Reactive (02/25 1218)   Rhogam  HBsAg: Negative (02/25 1218)   TDaP vaccine      Declines                  Flu Shot:Declines HIV: Non Reactive (02/25 1218)   Baby Food                                GBS: negative  Contraception  Pap:NIL  CBB     CS/VBAC    Support Person                Gestational age appropriate obstetric precautions including but not limited to vaginal bleeding, contractions, leaking of fluid and fetal movement were reviewed in detail with the patient.    NST: 130 bpm baseline, moderate variability, 10x10 accelerations, no decelerations. Non-reactive Will obtain growth [place] &BPP for uterine size date discrepancy  EFW 27%, normal AFI, BPP 8/8 IOL planned for next week   Return in about 1 week (around 07/24/2019), or if symptoms worsen or fail to improve.  Natale Milch MD [facility], [facility] 07/17/2019, 4:52 PM

## 2019-07-20 ENCOUNTER — Other Ambulatory Visit: Payer: Self-pay

## 2019-07-20 ENCOUNTER — Inpatient Hospital Stay
Admission: EM | Admit: 2019-07-20 | Discharge: 2019-07-21 | DRG: 807 | Disposition: A | Discharge status: Home / Self Care | Payer: Medicaid Other | Attending: Obstetrics & Gynecology | Admitting: Obstetrics & Gynecology

## 2019-07-20 DIAGNOSIS — Z3A38 38 weeks gestation of pregnancy: Secondary | ICD-10-CM | POA: Diagnosis not present

## 2019-07-20 DIAGNOSIS — Z20828 Contact with and (suspected) exposure to other viral communicable diseases: Secondary | ICD-10-CM | POA: Diagnosis present

## 2019-07-20 DIAGNOSIS — Z3403 Encounter for supervision of normal first pregnancy, third trimester: Secondary | ICD-10-CM

## 2019-07-20 DIAGNOSIS — O479 False labor, unspecified: Secondary | ICD-10-CM | POA: Diagnosis present

## 2019-07-20 DIAGNOSIS — O26893 Other specified pregnancy related conditions, third trimester: Secondary | ICD-10-CM | POA: Diagnosis present

## 2019-07-20 DIAGNOSIS — Z3490 Encounter for supervision of normal pregnancy, unspecified, unspecified trimester: Secondary | ICD-10-CM

## 2019-07-20 LAB — CBC
HCT: 33.2 % — ABNORMAL LOW (ref 36.0–46.0)
Hemoglobin: 10.8 g/dL — ABNORMAL LOW (ref 12.0–15.0)
MCH: 27.8 pg (ref 26.0–34.0)
MCHC: 32.5 g/dL (ref 30.0–36.0)
MCV: 85.3 fL (ref 80.0–100.0)
Platelets: 255 10*3/uL (ref 150–400)
RBC: 3.89 MIL/uL (ref 3.87–5.11)
RDW: 13.9 % (ref 11.5–15.5)
WBC: 10.3 10*3/uL (ref 4.0–10.5)
nRBC: 0 % (ref 0.0–0.2)

## 2019-07-20 LAB — TYPE AND SCREEN
ABO/RH(D): A POS
Antibody Screen: NEGATIVE

## 2019-07-20 LAB — RAPID HIV SCREEN (HIV 1/2 AB+AG)
HIV 1/2 Antibodies: NONREACTIVE
HIV-1 P24 Antigen - HIV24: NONREACTIVE

## 2019-07-20 LAB — SARS CORONAVIRUS 2 BY RT PCR (HOSPITAL ORDER, PERFORMED IN ~~LOC~~ HOSPITAL LAB): SARS Coronavirus 2: NEGATIVE

## 2019-07-20 LAB — RPR: RPR Ser Ql: NONREACTIVE

## 2019-07-20 LAB — RUPTURE OF MEMBRANE (ROM)PLUS: Rom Plus: POSITIVE

## 2019-07-20 MED ORDER — PRENATAL MULTIVITAMIN CH: 1.0000 | ORAL_TABLET | Freq: Every day | ORAL | Status: DC

## 2019-07-20 MED ORDER — LACTATED RINGERS IV SOLN
INTRAVENOUS | Status: DC
  Administered 2019-07-20: 06:00:00 via INTRAVENOUS

## 2019-07-20 MED ORDER — SENNOSIDES-DOCUSATE SODIUM 8.6-50 MG PO TABS
2.0000 | ORAL_TABLET | ORAL | Status: DC
  Filled 2019-07-20: qty 2

## 2019-07-20 MED ORDER — OXYTOCIN BOLUS FROM INFUSION: 500.0000 mL | Freq: Once | INTRAVENOUS | Status: DC

## 2019-07-20 MED ORDER — ONDANSETRON HCL 4 MG PO TABS: 4.0000 mg | ORAL_TABLET | ORAL | Status: DC | PRN

## 2019-07-20 MED ORDER — ONDANSETRON HCL 4 MG/2ML IJ SOLN: 4.0000 mg | Freq: Four times a day (QID) | INTRAMUSCULAR | Status: DC | PRN

## 2019-07-20 MED ORDER — IBUPROFEN 600 MG PO TABS
600.0000 mg | ORAL_TABLET | Freq: Four times a day (QID) | ORAL | Status: DC
  Administered 2019-07-21 (×3): 600 mg via ORAL
  Filled 2019-07-20 (×3): qty 1

## 2019-07-20 MED ORDER — SOD CITRATE-CITRIC ACID 500-334 MG/5ML PO SOLN: 30.0000 mL | ORAL | Status: DC | PRN

## 2019-07-20 MED ORDER — BENZOCAINE-MENTHOL 20-0.5 % EX AERO: 1.0000 "application " | INHALATION_SPRAY | CUTANEOUS | Status: DC | PRN

## 2019-07-20 MED ORDER — COCONUT OIL OIL: 1.0000 "application " | TOPICAL_OIL | Status: DC | PRN

## 2019-07-20 MED ORDER — LIDOCAINE HCL (PF) 1 % IJ SOLN
INTRAMUSCULAR | Status: AC
  Filled 2019-07-20: qty 30

## 2019-07-20 MED ORDER — WITCH HAZEL-GLYCERIN EX PADS: 1.0000 "application " | MEDICATED_PAD | CUTANEOUS | Status: DC | PRN

## 2019-07-20 MED ORDER — DIBUCAINE (PERIANAL) 1 % EX OINT: 1.0000 "application " | TOPICAL_OINTMENT | CUTANEOUS | Status: DC | PRN

## 2019-07-20 MED ORDER — LIDOCAINE HCL (PF) 1 % IJ SOLN: 30.0000 mL | INTRAMUSCULAR | Status: DC | PRN

## 2019-07-20 MED ORDER — OXYTOCIN 40 UNITS IN NORMAL SALINE INFUSION - SIMPLE MED
2.5000 [IU]/h | INTRAVENOUS | Status: DC
  Filled 2019-07-20: qty 1000

## 2019-07-20 MED ORDER — ONDANSETRON HCL 4 MG/2ML IJ SOLN: 4.0000 mg | INTRAMUSCULAR | Status: DC | PRN

## 2019-07-20 MED ORDER — TERBUTALINE SULFATE 1 MG/ML IJ SOLN: 0.2500 mg | Freq: Once | INTRAMUSCULAR | Status: DC | PRN

## 2019-07-20 MED ORDER — OXYTOCIN 40 UNITS IN NORMAL SALINE INFUSION - SIMPLE MED
1.0000 m[IU]/min | INTRAVENOUS | Status: DC
  Administered 2019-07-20: 1 m[IU]/min via INTRAVENOUS

## 2019-07-20 MED ORDER — ACETAMINOPHEN 325 MG PO TABS: 650.0000 mg | ORAL_TABLET | ORAL | Status: DC | PRN

## 2019-07-20 MED ORDER — TETANUS-DIPHTH-ACELL PERTUSSIS 5-2.5-18.5 LF-MCG/0.5 IM SUSP: 0.5000 mL | Freq: Once | INTRAMUSCULAR | Status: DC

## 2019-07-20 MED ORDER — MISOPROSTOL 200 MCG PO TABS
ORAL_TABLET | ORAL | Status: AC
  Filled 2019-07-20: qty 4

## 2019-07-20 MED ORDER — DIPHENHYDRAMINE HCL 25 MG PO CAPS: 25.0000 mg | ORAL_CAPSULE | Freq: Four times a day (QID) | ORAL | Status: DC | PRN

## 2019-07-20 MED ORDER — SIMETHICONE 80 MG PO CHEW: 80.0000 mg | CHEWABLE_TABLET | ORAL | Status: DC | PRN

## 2019-07-20 MED ORDER — OXYTOCIN 10 UNIT/ML IJ SOLN
INTRAMUSCULAR | Status: AC
  Administered 2019-07-20: 10 [IU]
  Filled 2019-07-20: qty 2

## 2019-07-20 MED ORDER — AMMONIA AROMATIC IN INHA
RESPIRATORY_TRACT | Status: AC
  Filled 2019-07-20: qty 10

## 2019-07-20 MED ORDER — LACTATED RINGERS IV SOLN: 500.0000 mL | INTRAVENOUS | Status: DC | PRN

## 2019-07-20 NOTE — H&P (Signed)
Contractions have dissipated. Patient comfortable and sleeping. Not feeling frequent contractions. Will start pitocin for augmentation of labor. Category I tracing.   Adrian Prows MD [facility], [facility] 07/20/2019 7:30 AM

## 2019-07-20 NOTE — OB Triage Note (Signed)
Pt arrival to triage with c/o leaking of fluid, most noticeably at midnight.  Contractions became regularly at that time.  Pt states some bloody show.  Pt feel fetal movement.  EFM and toco applied and assessing.

## 2019-07-20 NOTE — Discharge Summary (Addendum)
OB Discharge Summary     Patient Name: Madison Dixon DOB: Apr 05, 1996 MRN: [id number]  Date of admission: 07/20/2019 Delivering provider: Tresea Mall, CNM  Date of Delivery: 07/20/2019  Date of discharge: 07/21/2019  Admitting diagnosis: Pregnancy Intrauterine pregnancy: [redacted]w[redacted]d     Secondary diagnosis: None     Discharge diagnosis: Term Pregnancy Delivered                                                                                                Post partum procedures:varivax offered at discharge  Augmentation: AROM and Pitocin  Complications: None  Hospital course:  Onset of Labor With Vaginal Delivery      23 y.o. yo G1P0000 at [redacted]w[redacted]d was admitted in Active Labor on 07/20/2019. Patient had an uncomplicated labor course as follows:  Membrane Rupture Time/Date: 11:19 AM ,07/20/2019   Patient had delivery of viable female 3:29 PM, 07/20/2019 See delivery note for details of delivery  Patient had an uncomplicated postpartum course.  She is tolerating regular diet. Her pain is controlled with PO medication. She is ambulating and voiding without difficulty. She did have some mild range blood pressures noted overnight PPD0 to PPD1 which normalized prior to discharge.  Will have patient come in for 1 week BP check.  Discussed presentation for headaches.  Patient is discharged home in stable condition on 07/21/19.   Physical exam  Vitals:   07/20/19 1757 07/20/19 1934 07/20/19 2307 07/21/19 0816  BP: 115/74 132/71 128/69 119/71  Pulse: 80 86 83 84  Resp: 18 18 18 18   Temp: 98 F (36.7 C) 98.7 F (37.1 C) 98.4 F (36.9 C) 98 F (36.7 C)  TempSrc: Oral Oral Oral Oral  SpO2: 99% 97% 98% 99%  Weight:      Height:       General: alert Lochia: appropriate Uterine Fundus: firm Incision: N/A DVT Evaluation: No evidence of DVT seen on physical exam.  Labs: Lab Results  Component Value Date   WBC 12.3 (H) 07/21/2019   HGB 9.4 (L) 07/21/2019   HCT 29.6 (L) 07/21/2019   MCV 85.8 07/21/2019   PLT 199 07/21/2019    Discharge instruction: per After Visit Summary.  Medications:  Allergies as of 07/21/2019      Reactions   Aripiprazole Nausea And Vomiting   Vicodin [hydrocodone-acetaminophen] Nausea And Vomiting      Medication List    TAKE these medications   ibuprofen 600 MG tablet Commonly known as: ADVIL Take 1 tablet (600 mg total) by mouth every 6 (six) hours.   multivitamin-prenatal 27-0.8 MG Tabs tablet Take 1 tablet by mouth daily at 12 noon.       Diet: routine diet  Activity: Advance as tolerated. Pelvic rest for 6 weeks.   Outpatient follow up: Follow-up Information    07/23/2019, CNM. Schedule an appointment as soon as possible for a visit in 1 week(s).   Specialty: Obstetrics Why: BP check Contact information: [address] [place] [phone]             Postpartum contraception: undecided Rhogam Given postpartum: NA Rubella  vaccine given postpartum: Immune Varicella vaccine given postpartum: yes TDaP given antepartum or postpartum: declined  Newborn Data: Live born female [place] Birth Weight:  6 pounds 11 ounces APGAR: 9, 9  Newborn Delivery   Birth date/time: 07/20/2019 15:29:00 Delivery type: Vaginal, Spontaneous       Baby Feeding: Breastfeeding  Disposition:home with mother  SIGNED:  Malachy Mood, MD 07/21/2019 10:02 AM

## 2019-07-20 NOTE — Progress Notes (Signed)
  Labor Progress Note   23 y.o. G1P0000 @ [redacted]w[redacted]d , admitted for  Pregnancy, Labor Management.   Subjective:  Out of bed standing/sitting on birth ball, breathing through contractions  Objective:  BP 136/85   Pulse 74   Temp 97.7 F (36.5 C) (Oral)   Resp 16   Ht 5\' 2"  (1.575 m)   Wt 83.9 kg   LMP 09/28/2018   BMI 33.84 kg/m  Abd: gravid, ND, FHT present, mild tenderness on exam Extr: no edema SVE: CERVIX: 5 cm dilated, 90 effaced, 0 station AROM fore-bag: moderate amount of clear/pink fluid  EFM: FHR: 135 bpm, variability: moderate,  accelerations:  Present,  decelerations:  Absent Toco: Frequency: difficulty tracing. Patient reports every 2-3 minutes and the same by observation Labs: I have reviewed the patient's lab results.   Assessment & Plan:  G1P0000 @ [redacted]w[redacted]d, admitted for  Pregnancy and Labor/Delivery Management  1. Pain management: none. 2. FWB: FHT category I.  3. ID: GBS negative 4. Labor management: continue pitocin  All discussed with patient, see orders   Rod Can, [facility] 07/20/2019  11:22 AM

## 2019-07-20 NOTE — Lactation Note (Signed)
This note was copied from a baby's chart. Lactation Consultation Note  Patient Name: Madison Dixon [id number] Date: 07/20/2019 Reason for consult: Initial assessment;Difficult latch;Primapara;1st time breastfeeding;Early term 37-38.6wks   Maternal Data Formula Feeding for Exclusion: No Has patient been taught Hand Expression?: Yes Does the patient have breastfeeding experience prior to this delivery?: No Mom has bilateral inverted nipples that mom can evert, large, thick nipples, baby has small mouth but able to latch when she opens wide and able to nurse consistently for 15 min each breast in side lying position.   Mom has uterine cramping when baby nurses Feeding Feeding Type: Breast Fed  LATCH Score Latch: Repeated attempts needed to sustain latch, nipple held in mouth throughout feeding, stimulation needed to elicit sucking reflex.  Audible Swallowing: Spontaneous and intermittent, audible  Type of Nipple: Inverted Will evert per mom, baby able to latch with shaping and wide mouth, gentle pressure on lower jaw Comfort (Breast/Nipple): Soft / non-tender  Hold (Positioning): Assistance needed to correctly position infant at breast and maintain latch.  LATCH Score: 6  Interventions Interventions: Breast feeding basics reviewed;Assisted with latch;Skin to skin;Hand express;Adjust position;Support pillows;Position options  Lactation Tools Discussed/Used WIC Program: No   Consult Status Consult Status: Follow-up Date: 07/21/19 Follow-up type: In-patient    Ferol Luz 07/20/2019, 6:58 PM

## 2019-07-20 NOTE — H&P (Signed)
H&P  Madison Dixon is an 23 y.o. female.  HPI: She presents today complaining of contractions and leakage of fluid which started close to midnight. She reports that the fluid was clear and had a small amount of pink discharge. She is feeling good fetal movement.     Pregnancy#1 Problems (from 09/28/18 to present)    Problem Noted Resolved   Supervision of normal first pregnancy 12/19/2018 by Madison Bonnet, MD No   Overview Addendum 07/14/2019  7:15 PM by Madison Dixon, Madison Dixon Prenatal Labs  Dating 7wk2d ultrasound Blood type: A/Positive/-- (02/25 1218)   Genetic Screen 1 Screen:    AFP:     Quad:     NIPS: diploid XX Antibody:Negative (02/25 1218)  Anatomic [place] Normal, anterior placenta Rubella: 2.70 (02/25 1218) Varicella:Nonimmune  GTT Third trimester: 126 RPR: Non Reactive (02/25 1218)   Rhogam Not needed HBsAg: Negative (02/25 1218)   TDaP vaccine      Declines                 Flu Shot:  HIV: Non Reactive (02/25 1218)   Baby Food                                GBS: negative  Contraception  Pap:NIL  CBB     CS/VBAC    Support Person                Past Medical History:  Diagnosis Date  . Anxiety   . Asthma    sports induced  . Bipolar disorder ([place])   . Chronic pelvic pain in female 09/09/2016  . Depression   . Endometriosis determined by laparoscopy 09/09/2016  . Ovarian cyst   . Pilonidal cyst   . PONV (postoperative nausea and vomiting)     Past Surgical History:  Procedure Laterality Date  . COLONOSCOPY WITH ESOPHAGOGASTRODUODENOSCOPY (EGD)    . LAPAROSCOPY N/A 09/09/2016   Procedure: LAPAROSCOPY DIAGNOSTIC, FULGERATION OF ENDOMETRIOSIS;  Surgeon: Madison Bonnet, MD;  Location: [facility] ORS;  Service: Gynecology;  Laterality: N/A;  . PILONIDAL CYST EXCISION N/A 04/10/2015   Procedure: CYST EXCISION PILONIDAL EXTENSIVE;  Surgeon: Madison Lye, MD;  Location: [facility] ORS;  Service: General;  Laterality: N/A;    Family History   Problem Relation Age of Onset  . Hyperlipidemia Father   . GER disease Father     Social History:  reports that she has never smoked. She has never used smokeless tobacco. She reports previous alcohol use. She reports previous drug use. Drug: Marijuana.  Allergies:  Allergies  Allergen Reactions  . Aripiprazole Nausea And Vomiting  . Vicodin [Hydrocodone-Acetaminophen] Nausea And Vomiting    Medications: I have reviewed the patient's current medications.  Results for orders placed or performed during the hospital encounter of 07/20/19 (from the past 48 hour(s))  ROM Plus ([place] only)     Status: None   Collection Time: 07/20/19  4:10 AM  Result Value Ref Range   Rom Plus POSITIVE     Comment: Performed at [facility], [address] [place], [place] [address]    No results found.  Review of Systems  Constitutional: Negative for chills, fever, malaise/fatigue and weight loss.  HENT: Negative for congestion, hearing loss and sinus pain.   Eyes: Negative for blurred vision and double vision.  Respiratory: Negative for cough, sputum production, shortness of breath and  wheezing.   Cardiovascular: Negative for chest pain, palpitations, orthopnea and leg swelling.  Gastrointestinal: Negative for abdominal pain, constipation, diarrhea, nausea and vomiting.  Genitourinary: Negative for dysuria, flank pain, frequency, hematuria and urgency.  Musculoskeletal: Negative for back pain, falls and joint pain.  Skin: Negative for itching and rash.  Neurological: Negative for dizziness and headaches.  Psychiatric/Behavioral: Negative for depression, substance abuse and suicidal ideas. The patient is not nervous/anxious.    Blood pressure 139/85, pulse 66, temperature 98.5 F (36.9 C), temperature source Oral, resp. rate 16, height 5\' 2"  (1.575 m), weight 83.9 kg, last menstrual period 09/28/2018. Physical Exam  Nursing note and vitals reviewed. Constitutional: She is oriented to  person, place, and time. She appears well-developed and well-nourished.  HENT:  Head: Normocephalic and atraumatic.  Cardiovascular: Normal rate and regular rhythm.  Respiratory: Effort normal and breath sounds normal.  GI: Soft. Bowel sounds are normal.  Musculoskeletal: Normal range of motion.  Neurological: She is alert and oriented to person, place, and time.  Skin: Skin is warm and dry.  Psychiatric: She has a normal mood and affect. Her behavior is normal. Judgment and thought content normal.   NST: 130  bpm baseline, moderate variability, 15x15 accelerations, no decelerations. Tocometer : q 2-3 min  ROM +: posiive  Assessment/Plan: 23 yo G1P0000 [redacted]w[redacted]d 1. Madison admit for expectant management of labor, augmentation if needed. 2. GBS negative. 3. Epidural PRN.  Madison Dixon Madison Dixon 07/20/2019, 4:34 AM

## 2019-07-21 LAB — CBC
HCT: 29.6 % — ABNORMAL LOW (ref 36.0–46.0)
Hemoglobin: 9.4 g/dL — ABNORMAL LOW (ref 12.0–15.0)
MCH: 27.2 pg (ref 26.0–34.0)
MCHC: 31.8 g/dL (ref 30.0–36.0)
MCV: 85.8 fL (ref 80.0–100.0)
Platelets: 199 10*3/uL (ref 150–400)
RBC: 3.45 MIL/uL — ABNORMAL LOW (ref 3.87–5.11)
RDW: 14.2 % (ref 11.5–15.5)
WBC: 12.3 10*3/uL — ABNORMAL HIGH (ref 4.0–10.5)
nRBC: 0 % (ref 0.0–0.2)

## 2019-07-21 MED ORDER — IBUPROFEN 600 MG PO TABS: 600.0000 mg | ORAL_TABLET | Freq: Four times a day (QID) | ORAL | 0 refills | Status: DC

## 2019-07-21 MED ORDER — VARICELLA VIRUS VACCINE LIVE 1350 PFU/0.5ML IJ SUSR
0.5000 mL | Freq: Once | INTRAMUSCULAR | Status: DC
  Filled 2019-07-21: qty 0.5

## 2019-07-21 NOTE — Progress Notes (Signed)
Pt educated on benefits of recieving TDAP and Varicella Vaccine. Pt verbalized understanding but still declined

## 2019-07-21 NOTE — Progress Notes (Signed)
Patient discharged home with infant. Discharge instructions, prescriptions and follow up appointment given to and reviewed with patient. Patient verbalized understanding. Patient wheeled out with infant by RN 

## 2019-07-25 ENCOUNTER — Ambulatory Visit (INDEPENDENT_AMBULATORY_CARE_PROVIDER_SITE_OTHER): Payer: Medicaid Other | Admitting: Advanced Practice Midwife

## 2019-07-25 ENCOUNTER — Encounter: Payer: Self-pay | Admitting: Advanced Practice Midwife

## 2019-07-25 ENCOUNTER — Other Ambulatory Visit: Payer: Self-pay

## 2019-07-25 DIAGNOSIS — Z0130 Encounter for examination of blood pressure without abnormal findings: Secondary | ICD-10-CM

## 2019-07-25 NOTE — Progress Notes (Signed)
Obstetrics & Gynecology Office Visit   Chief Complaint:  Chief Complaint  Patient presents with  . Follow-up  . Postpartum Care  . Blood Pressure Check    History of Present Illness: 23 y.o. G1P0000 being seen for follow up blood pressure check today.  The patient is 5 days postpartum. The established diagnosis for the patient is postpartum vaginal delivery.  She is currently on no antihypertensives.  She reports headaches the past few days that last for a few minutes. She has some mild engorgement but reports breastfeeding is going well. She denies any areas that are red, warm or tender to touch.  Medication list reviewed.  Review of Systems: Review of Systems  Constitutional: Negative.   HENT: Negative.   Eyes: Negative.   Respiratory: Negative.   Cardiovascular: Negative.   Gastrointestinal: Negative.   Genitourinary: Negative.   Musculoskeletal: Negative.   Skin: Negative.   Neurological: Positive for headaches.  Endo/Heme/Allergies: Negative.   Psychiatric/Behavioral: Negative.      Past Medical History:  Past Medical History:  Diagnosis Date  . Anxiety   . Asthma    sports induced  . Bipolar disorder ([place])   . Chronic pelvic pain in female 09/09/2016  . Depression   . Endometriosis determined by laparoscopy 09/09/2016  . Ovarian cyst   . Pilonidal cyst   . PONV (postoperative nausea and vomiting)     Past Surgical History:  Past Surgical History:  Procedure Laterality Date  . COLONOSCOPY WITH ESOPHAGOGASTRODUODENOSCOPY (EGD)    . LAPAROSCOPY N/A 09/09/2016   Procedure: LAPAROSCOPY DIAGNOSTIC, FULGERATION OF ENDOMETRIOSIS;  Surgeon: Will Bonnet, MD;  Location: [facility] ORS;  Service: Gynecology;  Laterality: N/A;  . PILONIDAL CYST EXCISION N/A 04/10/2015   Procedure: CYST EXCISION PILONIDAL EXTENSIVE;  Surgeon: Christene Lye, MD;  Location: [facility] ORS;  Service: General;  Laterality: N/A;    Gynecologic History: No LMP recorded.  Obstetric  History: G1P0000  Family History:  Family History  Problem Relation Age of Onset  . Hyperlipidemia Father   . GER disease Father     Social History:  Social History   Socioeconomic History  . Marital status: Married    Spouse name: Richardson Landry  . Number of children: Not on file  . Years of education: Not on file  . Highest education level: Not on file  Occupational History  . Not on file  Social Needs  . Financial resource strain: Not hard at all  . Food insecurity    Worry: Never true    Inability: Never true  . Transportation needs    Medical: No    Non-medical: No  Tobacco Use  . Smoking status: Never Smoker  . Smokeless tobacco: Never Used  Substance and Sexual Activity  . Alcohol use: Not Currently    Alcohol/week: 0.0 standard drinks    Comment: drinks on friday nights  . Drug use: Not Currently    Types: Marijuana  . Sexual activity: Yes    Birth control/protection: None    Comment: undecided  Lifestyle  . Physical activity    Days per week: 3 days    Minutes per session: 20 min  . Stress: Not at all  Relationships  . Social Herbalist on phone: Three times a week    Gets together: Once a week    Attends religious service: 1 to 4 times per year    Active member of club or organization: No    Attends  meetings of clubs or organizations: Never    Relationship status: Married  . Intimate partner violence    Fear of current or ex partner: No    Emotionally abused: No    Physically abused: No    Forced sexual activity: No  Other Topics Concern  . Not on file  Social History Narrative  . Not on file    Allergies:  Allergies  Allergen Reactions  . Aripiprazole Nausea And Vomiting  . Vicodin [Hydrocodone-Acetaminophen] Nausea And Vomiting    Medications: Prior to Admission medications   Medication Sig Start Date End Date Taking? Authorizing Provider  ibuprofen (ADVIL) 600 MG tablet Take 1 tablet (600 mg total) by mouth every 6 (six) hours.  07/21/19   Vena Austria, MD  Prenatal Vit-Fe Fumarate-FA (MULTIVITAMIN-PRENATAL) 27-0.8 MG TABS tablet Take 1 tablet by mouth daily at 12 noon.    [provider]    Physical Exam Blood pressure 122/74, height 5\' 2"  (1.575 m), weight 162 lb (73.5 kg), currently breastfeeding.  No LMP recorded.  General: NAD HEENT: normocephalic, anicteric Pulmonary: No increased work of breathing Cardiovascular: RRR, distal pulses 2+ Extremities: no edema, no erythema, no tenderness Neurologic: Grossly intact Psychiatric: mood appropriate, affect full  Assessment: 23 y.o. G1P0000 presenting for blood pressure evaluation today  Plan: Problem List Items Addressed This Visit    None      1) Blood pressure - blood pressure at today's visit is normotensive.   2) Return to clinic in 4-5 weeks for 6 week postpartum follow up or as needed.   21, CNM [facility] 07/25/2019, 8:28 AM

## 2019-08-29 ENCOUNTER — Ambulatory Visit: Payer: Medicaid Other | Admitting: Advanced Practice Midwife

## 2019-08-31 ENCOUNTER — Other Ambulatory Visit: Payer: Self-pay

## 2019-08-31 ENCOUNTER — Emergency Department: Payer: Medicaid Other

## 2019-08-31 ENCOUNTER — Emergency Department
Admission: EM | Admit: 2019-08-31 | Discharge: 2019-08-31 | Disposition: A | Discharge status: Home / Self Care | Payer: Medicaid Other | Attending: Emergency Medicine | Admitting: Emergency Medicine

## 2019-08-31 DIAGNOSIS — F1210 Cannabis abuse, uncomplicated: Secondary | ICD-10-CM | POA: Diagnosis not present

## 2019-08-31 DIAGNOSIS — R1031 Right lower quadrant pain: Secondary | ICD-10-CM | POA: Insufficient documentation

## 2019-08-31 DIAGNOSIS — R109 Unspecified abdominal pain: Secondary | ICD-10-CM | POA: Diagnosis present

## 2019-08-31 DIAGNOSIS — R110 Nausea: Secondary | ICD-10-CM | POA: Diagnosis not present

## 2019-08-31 DIAGNOSIS — O9089 Other complications of the puerperium, not elsewhere classified: Secondary | ICD-10-CM

## 2019-08-31 LAB — COMPREHENSIVE METABOLIC PANEL
ALT: 34 U/L (ref 0–44)
AST: 29 U/L (ref 15–41)
Albumin: 4.1 g/dL (ref 3.5–5.0)
Alkaline Phosphatase: 44 U/L (ref 38–126)
Anion gap: 12 (ref 5–15)
BUN: 18 mg/dL (ref 6–20)
CO2: 23 mmol/L (ref 22–32)
Calcium: 9.8 mg/dL (ref 8.9–10.3)
Chloride: 104 mmol/L (ref 98–111)
Creatinine, Ser: 0.64 mg/dL (ref 0.44–1.00)
GFR calc Af Amer: >60 mL/min (ref >60)
GFR calc non Af Amer: >60 mL/min (ref >60)
Glucose, Bld: 84 mg/dL (ref 70–99)
Potassium: 4.2 mmol/L (ref 3.5–5.1)
Sodium: 139 mmol/L (ref 135–145)
Total Bilirubin: 0.7 mg/dL (ref 0.3–1.2)
Total Protein: 7.6 g/dL (ref 6.5–8.1)

## 2019-08-31 LAB — URINALYSIS, COMPLETE (UACMP) WITH MICROSCOPIC
Bilirubin Urine: NEGATIVE
Glucose, UA: NEGATIVE mg/dL
Hgb urine dipstick: NEGATIVE
Ketones, ur: NEGATIVE mg/dL
Leukocytes,Ua: NEGATIVE
Nitrite: NEGATIVE
Protein, ur: NEGATIVE mg/dL
Specific Gravity, Urine: 1.006 (ref 1.005–1.030)
pH: 6 (ref 5.0–8.0)

## 2019-08-31 LAB — POCT PREGNANCY, URINE: Preg Test, Ur: NEGATIVE

## 2019-08-31 LAB — CBC
HCT: 38.1 % (ref 36.0–46.0)
Hemoglobin: 12.5 g/dL (ref 12.0–15.0)
MCH: 27.5 pg (ref 26.0–34.0)
MCHC: 32.8 g/dL (ref 30.0–36.0)
MCV: 83.9 fL (ref 80.0–100.0)
Platelets: 309 10*3/uL (ref 150–400)
RBC: 4.54 MIL/uL (ref 3.87–5.11)
RDW: 14.5 % (ref 11.5–15.5)
WBC: 8.5 10*3/uL (ref 4.0–10.5)
nRBC: 0 % (ref 0.0–0.2)

## 2019-08-31 LAB — LIPASE, BLOOD: Lipase: 20 U/L (ref 11–51)

## 2019-08-31 MED ORDER — IOHEXOL 9 MG/ML PO SOLN
500.0000 mL | Freq: Once | ORAL | Status: AC
  Administered 2019-08-31: 14:00:00 1000 mL via ORAL
  Filled 2019-08-31: qty 500

## 2019-08-31 MED ORDER — IOHEXOL 300 MG/ML  SOLN
100.0000 mL | Freq: Once | INTRAMUSCULAR | Status: AC | PRN
  Administered 2019-08-31: 15:00:00 100 mL via INTRAVENOUS
  Filled 2019-08-31: qty 100

## 2019-08-31 MED ORDER — MORPHINE SULFATE (PF) 4 MG/ML IV SOLN
4.0000 mg | Freq: Once | INTRAVENOUS | Status: AC
  Administered 2019-08-31: 4 mg via INTRAVENOUS
  Filled 2019-08-31: qty 1

## 2019-08-31 MED ORDER — ONDANSETRON HCL 4 MG/2ML IJ SOLN
4.0000 mg | Freq: Once | INTRAMUSCULAR | Status: AC
  Administered 2019-08-31: 14:00:00 4 mg via INTRAVENOUS
  Filled 2019-08-31: qty 2

## 2019-08-31 MED ORDER — MORPHINE SULFATE (PF) 4 MG/ML IV SOLN: 4.0000 mg | Freq: Once | INTRAVENOUS | Status: DC

## 2019-08-31 MED ORDER — MORPHINE SULFATE (PF) 4 MG/ML IV SOLN
4.0000 mg | Freq: Once | INTRAVENOUS | Status: AC
  Administered 2019-08-31: 14:00:00 4 mg via INTRAVENOUS
  Filled 2019-08-31: qty 1

## 2019-08-31 MED ORDER — OXYCODONE-ACETAMINOPHEN 5-325 MG PO TABS: 1.0000 | ORAL_TABLET | ORAL | 0 refills | Status: DC | PRN

## 2019-08-31 MED ORDER — ONDANSETRON 4 MG PO TBDP: 4.0000 mg | ORAL_TABLET | Freq: Three times a day (TID) | ORAL | 0 refills | Status: DC | PRN

## 2019-08-31 NOTE — ED Notes (Signed)

## 2019-08-31 NOTE — Discharge Instructions (Addendum)
You have a little bit of thickening of the intestinal walls in the middle of the intestine on the CAT scan.  You also have a fair amount of stool in the colon.  This is fairly nonspecific.  I discussed your case with the gastroenterologist and he did not feel that we needed to give you any antibiotics at this point.  Please return for increasing pain fever or vomiting or bad diarrhea.  I have given you a little bit of some pain medicine 1 pill 4 times a day as needed for the pain rest and take it easy.  If Motrin is good enough you can use that instead of the pain medicine.  I have also given you some Zofran melt on your tongue wafers to use 1 wafer 3 times a day if needed for nausea.  While you are taking these medicines I would not breast-feed.  I would pump your breast and dump the milk and use formula for the time being.  You can resume breast-feeding a day or so after you finish using the medicine.  I would follow-up with your regular doctor Monday if you are still having a problem and again please return if you are worse.  Before you use the pain medication it may be a good idea to take a laxative or enema to see if getting rid of some of the extra stool may help with the belly pain.  Oftentimes a lot of stool in the colon or even a moderate amount of stool in the colon can cause fairly bad pain.

## 2019-08-31 NOTE — ED Provider Notes (Addendum)
[facility] Emergency Department Provider Note   ____________________________________________   First MD Initiated Contact with Patient 08/31/19 1337     (approximate)  I have reviewed the triage vital signs and the nursing notes.   HISTORY  Chief Complaint Abdominal Pain and Nausea    HPI Madison Dixon is a 23 y.o. female who delivered a baby 6 weeks ago.  She reports some vague abdominal pain which moved down to the right lower quadrant this past evening and today.  Pain is severe made worse with movement or hitting the bumps in the road.  She is not running a fever.  She is nauseated.  It started out sharp and is now dull and achy.         Past Medical History:  Diagnosis Date   Anxiety    Asthma    sports induced   Bipolar disorder ([facility])    Chronic pelvic pain in female 09/09/2016   Depression    Endometriosis determined by laparoscopy 09/09/2016   Ovarian cyst    Pilonidal cyst    PONV (postoperative nausea and vomiting)     Patient Active Problem List   Diagnosis Date Noted   Pregnancy 07/20/2019   Uterine contractions during pregnancy 07/20/2019   Postpartum care following vaginal delivery 07/20/2019   Indication for care in labor and delivery, antepartum 07/11/2019   Premature uterine contractions 06/19/2019   Abdominal pain during pregnancy in third trimester 06/15/2019   Supervision of normal first pregnancy 12/19/2018   Chronic pelvic pain in female 09/09/2016   Endometriosis determined by laparoscopy 09/09/2016   Allergic rhinitis 05/10/2008    Past Surgical History:  Procedure Laterality Date   COLONOSCOPY WITH ESOPHAGOGASTRODUODENOSCOPY (EGD)     LAPAROSCOPY N/A 09/09/2016   Procedure: LAPAROSCOPY DIAGNOSTIC, FULGERATION OF ENDOMETRIOSIS;  Surgeon: Conard NovakStephen D Jackson, MD;  Location: [facility] ORS;  Service: Gynecology;  Laterality: N/A;   PILONIDAL CYST EXCISION N/A 04/10/2015   Procedure: CYST  EXCISION PILONIDAL EXTENSIVE;  Surgeon: Kieth BrightlySeeplaputhur G Sankar, MD;  Location: [facility] ORS;  Service: General;  Laterality: N/A;    Prior to Admission medications   Medication Sig Start Date End Date Taking? Authorizing Provider  ibuprofen (ADVIL) 600 MG tablet Take 1 tablet (600 mg total) by mouth every 6 (six) hours. 07/21/19   Vena AustriaStaebler, Andreas, MD  ondansetron (ZOFRAN ODT) 4 MG disintegrating tablet Take 1 tablet (4 mg total) by mouth every 8 (eight) hours as needed for nausea or vomiting. 08/31/19   Arnaldo NatalMalinda, Nyemah Watton F, MD  oxyCODONE-acetaminophen (PERCOCET) 5-325 MG tablet Take 1 tablet by mouth every 4 (four) hours as needed for severe pain. 08/31/19 08/30/20  Arnaldo NatalMalinda, Mariapaula Krist F, MD  Prenatal Vit-Fe Fumarate-FA (MULTIVITAMIN-PRENATAL) 27-0.8 MG TABS tablet Take 1 tablet by mouth daily at 12 noon.    [provider]    Allergies Aripiprazole and Vicodin [hydrocodone-acetaminophen]  Family History  Problem Relation Age of Onset   Hyperlipidemia Father    GER disease Father     Social History Social History   Tobacco Use   Smoking status: Never Smoker   Smokeless tobacco: Never Used  Substance Use Topics   Alcohol use: Not Currently    Alcohol/week: 0.0 standard drinks    Comment: drinks on friday nights   Drug use: Not Currently    Types: Marijuana    Review of Systems  Constitutional: No fever/chills Eyes: No visual changes. ENT: No sore throat. Cardiovascular: Denies chest pain. Respiratory: Denies shortness of breath. Gastrointestinal: Right  lower quadrant abdominal pain.   nausea, no vomiting.  No diarrhea.  No constipation. Genitourinary: Negative for dysuria. Musculoskeletal: Negative for back pain. Skin: Negative for rash. Neurological: Negative for headaches, focal weakness   ____________________________________________   PHYSICAL EXAM:  VITAL SIGNS: ED Triage Vitals [08/31/19 1104]  Enc Vitals Group     BP 126/67     Pulse Rate 78     Resp 16      Temp 98.6 F (37 C)     Temp Source Oral     SpO2 100 %     Weight 150 lb (68 kg)     Height 5\' 2"  (1.575 m)     Head Circumference      Peak Flow      Pain Score 5     Pain Loc      Pain Edu?      Excl. in [place]?     Constitutional: Alert and oriented.  Uncomfortable appearing female Eyes: Conjunctivae are normal.  Head: Atraumatic. Nose: No congestion/rhinnorhea. Mouth/Throat: Mucous membranes are moist.  Oropharynx non-erythematous. Neck: No stridor. Cardiovascular: Normal rate, regular rhythm. Grossly normal heart sounds.  Good peripheral circulation. Respiratory: Normal respiratory effort.  No retractions. Lungs CTAB. Gastrointestinal: Soft tender to palpation percussion right lower quadrant in fact percussion of the rest of the abdomen makes the right lower quadrant hurt.  No distention. No abdominal bruits. No CVA tenderness. Musculoskeletal: No lower extremity tenderness nor edema.  No joint effusions. Neurologic:  Normal speech and language. No gross focal neurologic deficits are appreciated. Skin:  Skin is warm, dry and intact. No rash noted.   ____________________________________________   LABS (all labs ordered are listed, but only abnormal results are displayed)  Labs Reviewed  URINALYSIS, COMPLETE (UACMP) WITH MICROSCOPIC - Abnormal; Notable for the following components:      Result Value   Color, Urine STRAW (*)    APPearance CLEAR (*)    Bacteria, UA RARE (*)    All other components within normal limits  LIPASE, BLOOD  COMPREHENSIVE METABOLIC PANEL  CBC  POC URINE PREG, ED  POCT PREGNANCY, URINE   ____________________________________________  EKG   ____________________________________________  RADIOLOGY  ED MD interpretation: CT read by radiology reviewed by me shows moderate stool in the colon and some thickening of parts of the jejunum.  Appendix looks okay.  Ultrasound does not show any acute pathology per radiology reading.  I do not like it  there is films.  Official radiology report(s): US Pelvis (transabdominal Only)  Result Date: 08/31/2019 CLINICAL DATA:  RIGHT lower quadrant pain since yesterday, associated nausea, postpartum from vaginal delivery 6 weeks ago EXAM: [place]: Transabdominal ultrasound examination of the pelvis was performed including evaluation of the uterus, ovaries, adnexal regions, and pelvic cul-de-sac. Transvaginal imaging was not performed. Color and duplex Doppler ultrasound was utilized to evaluate blood flow to the ovaries. COMPARISON:  None since delivery FINDINGS: Uterus Measurements: 7.6 x 3.9 x 5.9 cm = volume: 91 mL. Anteverted. Normal morphology without mass Endometrium Thickness: 5 mm.  No endometrial fluid or focal abnormality Right ovary Measurements: 3.5 x 1.5 x 2.5 cm = volume: 7 mL. Normal morphology without mass. Internal blood flow present on color Doppler imaging. Left ovary Measurements: 3.7 x 2.1 x 2.4 cm = volume: 10 mL. Normal morphology without mass. Internal blood flow present on color Doppler imaging. Pulsed Doppler evaluation demonstrates normal low-resistance arterial and venous waveforms in both ovaries.  Other: No free pelvic fluid. No adnexal masses. Bladder well distended, visualized portions unremarkable. IMPRESSION: Normal appearance of uterus, endometrial complex and ovaries. No acute abnormalities. Electronically Signed   By: Ulyses Southward M.D.   On: 08/31/2019 16:19   Ct Abdomen Pelvis W Contrast  Result Date: 08/31/2019 CLINICAL DATA:  Right lower quadrant region pain. Recent vaginal delivery EXAM: CT ABDOMEN AND PELVIS WITH CONTRAST TECHNIQUE: Multidetector CT imaging of the abdomen and pelvis was performed using the standard protocol following bolus administration of intravenous contrast. Oral contrast was also administered. CONTRAST:  OMNIPAQUE IOHEXOL 300 MG/ML  SOLN COMPARISON:  November 29, 2016 FINDINGS:  Lower chest: There is mild atelectatic change in the posterior right base. No lung base edema or consolidation. Hepatobiliary: There is hepatic steatosis. No focal liver lesions are evident. The gallbladder wall is not appreciably thickened. There is no biliary duct dilatation. Pancreas: There is no pancreatic mass or inflammatory focus. Spleen: No splenic lesions are evident. Adrenals/Urinary Tract: Adrenals bilaterally appear normal. Kidneys bilaterally show no evident mass or hydronephrosis on either side. There is no evident renal or ureteral calculus on either side. Urinary bladder is midline with wall thickness within normal limits. Stomach/Bowel: There is moderate stool throughout the colon. There is mild wall thickening in several loops of mid to distal jejunum focally. Other bowel loops appear unremarkable. No evident transition zone to suggest bowel obstruction. Terminal ileum appears unremarkable. There is no evident free air or portal venous air. Vascular/Lymphatic: There is no abdominal aortic aneurysm. No vascular lesions are evident. Major venous structures appear patent. There is a circumaortic left renal vein, an anatomic variant. There is no evident adenopathy in the abdomen or pelvis. Reproductive: The uterus is midline. No pelvic mass evident. A small amount of fluid is noted in the cul-de-sac region. Other: Appendix appears normal. No abscess or ascites evident in the abdomen or pelvis. Musculoskeletal: No blastic or lytic bone lesions are evident. No abdominal wall or intramuscular lesions are evident. IMPRESSION: 1. Mild wall thickening of several loops of mid to distal jejunum slightly to the right of midline in the mid abdomen. Suspect a degree of enteritis. No bowel obstruction evident. 2.  Appendix appears normal.  No abscess in the abdomen or pelvis. 3. No renal or ureteral calculus. No hydronephrosis. Urinary bladder wall thickness within normal limits. 4. A small amount of fluid is  noted in the cul-de-sac. This amount of fluid may be upper physiologic or could represent recent ovarian cyst rupture. 5.  Hepatic steatosis. Electronically Signed   By: Bretta Bang III M.D.   On: 08/31/2019 14:56   US Pelvic Doppler (torsion R/o Or Mass Arterial Flow)  Result Date: 08/31/2019 CLINICAL DATA:  RIGHT lower quadrant pain since yesterday, associated nausea, postpartum from vaginal delivery 6 weeks ago EXAM: TRANSABDOMINAL ULTRASOUND OF PELVIS DOPPLER ULTRASOUND OF OVARIES TECHNIQUE: Transabdominal ultrasound examination of the pelvis was performed including evaluation of the uterus, ovaries, adnexal regions, and pelvic cul-de-sac. Transvaginal imaging was not performed. Color and duplex Doppler ultrasound was utilized to evaluate blood flow to the ovaries. COMPARISON:  None since delivery FINDINGS: Uterus Measurements: 7.6 x 3.9 x 5.9 cm = volume: 91 mL. Anteverted. Normal morphology without mass Endometrium Thickness: 5 mm.  No endometrial fluid or focal abnormality Right ovary Measurements: 3.5 x 1.5 x 2.5 cm = volume: 7 mL. Normal morphology without mass. Internal blood flow present on color Doppler imaging. Left ovary Measurements: 3.7 x 2.1 x 2.4 cm = volume:  10 mL. Normal morphology without mass. Internal blood flow present on color Doppler imaging. Pulsed Doppler evaluation demonstrates normal low-resistance arterial and venous waveforms in both ovaries. Other: No free pelvic fluid. No adnexal masses. Bladder well distended, visualized portions unremarkable. IMPRESSION: Normal appearance of uterus, endometrial complex and ovaries. No acute abnormalities. Electronically Signed   By: Ulyses Southward M.D.   On: 08/31/2019 16:19    ____________________________________________   PROCEDURES  Procedure(s) performed (including Critical Care):  Procedures  Discussed patient with Dr. Norma Fredrickson, GI.  He does not feel antibiotics are indicated at this  point. ____________________________________________   INITIAL IMPRESSION / ASSESSMENT AND PLAN / ED COURSE  Madison Dixon was evaluated in Emergency Department on 08/31/2019 for the symptoms described in the history of present illness. She was evaluated in the context of the global COVID-19 pandemic, which necessitated consideration that the patient might be at risk for infection with the SARS-CoV-2 virus that causes COVID-19. Institutional protocols and algorithms that pertain to the evaluation of patients at risk for COVID-19 are in a state of rapid change based on information released by regulatory bodies including the CDC and federal and state organizations. These policies and algorithms were followed during the patient's care in the ED.    I will let the patient go with some pain medication and some something for nausea.  She also has a good bit of stool in the colon.  I will recommend that she try as a laxative or enema first and see if that relieves some of the pain.  On discharge her pain is moved and is now in the right side in the mid abdomen not in the lower quadrant anymore.         ____________________________________________   FINAL CLINICAL IMPRESSION(S) / ED DIAGNOSES  Final diagnoses:  Right lower quadrant abdominal pain     ED Discharge Orders         Ordered    oxyCODONE-acetaminophen (PERCOCET) 5-325 MG tablet  Every 4 hours PRN     08/31/19 1708    ondansetron (ZOFRAN ODT) 4 MG disintegrating tablet  Every 8 hours PRN     08/31/19 1708           Note:  This document was prepared using Dragon voice recognition software and may include unintentional dictation errors.    Arnaldo Natal, MD 08/31/19 1713    Arnaldo Natal, MD 08/31/19 1714

## 2019-08-31 NOTE — ED Triage Notes (Addendum)
Reports RLQ pain that started yesterday, nausea. Burning and dull pain. Denies urinary sx, denies diarrhea. Normal BM yesterday. Pt alert and oriented X4, cooperative, RR even and unlabored, color WNL. Pt in NAD.  Pt recent pregnancy  X 6 week ago, pt is currently breastfeeding. No change in vaginal bleeding since pain began yesterday.

## 2019-09-05 ENCOUNTER — Encounter: Payer: Self-pay | Admitting: Advanced Practice Midwife

## 2019-09-05 ENCOUNTER — Other Ambulatory Visit: Payer: Self-pay

## 2019-09-05 ENCOUNTER — Ambulatory Visit (INDEPENDENT_AMBULATORY_CARE_PROVIDER_SITE_OTHER): Payer: Medicaid Other | Admitting: Advanced Practice Midwife

## 2019-09-05 DIAGNOSIS — Z30430 Encounter for insertion of intrauterine contraceptive device: Secondary | ICD-10-CM | POA: Diagnosis not present

## 2019-09-05 NOTE — Progress Notes (Signed)
Postpartum Visit  Chief Complaint:  Chief Complaint  Patient presents with  . Postpartum Care    History of Present Illness: Patient is a 23 y.o. G1P0000 presents for postpartum visit.  Review the Delivery Report for details.  Date of delivery: 07/20/2019 Type of delivery: Vaginal delivery - Vacuum or forceps assisted  no Episiotomy No.  Laceration: no  Pregnancy or labor problems:  no Any problems since the delivery:  She was seen in the ER 5 days ago for generalized abdominal pain. She has not breast fed since then. She still has some upper quadrant tenderness.   Newborn Details:  SINGLETON :  12. BabyGender female. Birth weight: 6 pounds 11 ounces Maternal Details:  Breast or formula feeding: was breastfeeding and now formula feeding Intercourse: 1 time with pull out method Contraception after delivery: Yes Mirena IUD Any bowel or bladder issues: No bladder issues, having regular bowel movements Post partum depression/anxiety noted:  no Edinburgh Post-Partum Depression Score: 5 Date of last PAP: 12/19/18  no abnormalities   Review of Systems: Review of Systems  Constitutional: Negative.   HENT: Negative.   Eyes: Negative.   Respiratory: Negative.   Cardiovascular: Negative.   Gastrointestinal: Positive for abdominal pain.  Genitourinary: Negative.   Musculoskeletal: Negative.   Skin: Negative.   Neurological: Negative.   Endo/Heme/Allergies: Negative.   Psychiatric/Behavioral: Negative.      Past Medical History:  Past Medical History:  Diagnosis Date  . Anxiety   . Asthma    sports induced  . Bipolar disorder ([place])   . Chronic pelvic pain in female 09/09/2016  . Depression   . Endometriosis determined by laparoscopy 09/09/2016  . Ovarian cyst   . Pilonidal cyst   . PONV (postoperative nausea and vomiting)     Past Surgical History:  Past Surgical History:  Procedure Laterality Date  . COLONOSCOPY WITH ESOPHAGOGASTRODUODENOSCOPY (EGD)    .  LAPAROSCOPY N/A 09/09/2016   Procedure: LAPAROSCOPY DIAGNOSTIC, FULGERATION OF ENDOMETRIOSIS;  Surgeon: Will Bonnet, MD;  Location: [facility] ORS;  Service: Gynecology;  Laterality: N/A;  . PILONIDAL CYST EXCISION N/A 04/10/2015   Procedure: CYST EXCISION PILONIDAL EXTENSIVE;  Surgeon: Christene Lye, MD;  Location: [facility] ORS;  Service: General;  Laterality: N/A;    Family History:  Family History  Problem Relation Age of Onset  . Hyperlipidemia Father   . GER disease Father     Social History:  Social History   Socioeconomic History  . Marital status: Married    Spouse name: Richardson Landry  . Number of children: Not on file  . Years of education: Not on file  . Highest education level: Not on file  Occupational History  . Not on file  Social Needs  . Financial resource strain: Not hard at all  . Food insecurity    Worry: Never true    Inability: Never true  . Transportation needs    Medical: No    Non-medical: No  Tobacco Use  . Smoking status: Never Smoker  . Smokeless tobacco: Never Used  Substance and Sexual Activity  . Alcohol use: Not Currently    Alcohol/week: 0.0 standard drinks    Comment: drinks on friday nights  . Drug use: Not Currently    Types: Marijuana  . Sexual activity: Yes    Birth control/protection: None    Comment: undecided  Lifestyle  . Physical activity    Days per week: 3 days    Minutes per session: 20 min  .  Stress: Not at all  Relationships  . Social Musicianconnections    Talks on phone: Three times a week    Gets together: Once a week    Attends religious service: 1 to 4 times per year    Active member of club or organization: No    Attends meetings of clubs or organizations: Never    Relationship status: Married  . Intimate partner violence    Fear of current or ex partner: No    Emotionally abused: No    Physically abused: No    Forced sexual activity: No  Other Topics Concern  . Not on file  Social History Narrative  . Not on  file    Allergies:  Allergies  Allergen Reactions  . Aripiprazole Nausea And Vomiting  . Vicodin [Hydrocodone-Acetaminophen] Nausea And Vomiting    Medications: Prior to Admission medications   Medication Sig Start Date End Date Taking? Authorizing Provider  ibuprofen (ADVIL) 600 MG tablet Take 1 tablet (600 mg total) by mouth every 6 (six) hours. Patient not taking: Reported on 09/05/2019 07/21/19   Vena AustriaStaebler, Andreas, MD  ondansetron (ZOFRAN ODT) 4 MG disintegrating tablet Take 1 tablet (4 mg total) by mouth every 8 (eight) hours as needed for nausea or vomiting. Patient not taking: Reported on 09/05/2019 08/31/19   Arnaldo NatalMalinda, Paul F, MD  oxyCODONE-acetaminophen (PERCOCET) 5-325 MG tablet Take 1 tablet by mouth every 4 (four) hours as needed for severe pain. Patient not taking: Reported on 09/05/2019 08/31/19 08/30/20  Arnaldo NatalMalinda, Paul F, MD  Prenatal Vit-Fe Fumarate-FA (MULTIVITAMIN-PRENATAL) 27-0.8 MG TABS tablet Take 1 tablet by mouth daily at 12 noon.    [provider]    Physical Exam Blood pressure 118/74, height 5\' 2"  (1.575 m), weight 153 lb (69.4 kg), unknown if currently breastfeeding.    General: NAD HEENT: normocephalic, anicteric Pulmonary: No increased work of breathing Abdomen: NABS, soft, non-tender, non-distended.  Umbilicus without lesions.  No hepatomegaly, splenomegaly or masses palpable. No evidence of hernia. Genitourinary:  External: Normal external female genitalia.  Normal urethral meatus, normal Bartholin's and Skene's glands.    Vagina: Normal vaginal mucosa, no evidence of prolapse.    Cervix: Grossly normal in appearance, no bleeding, no CMT  Uterus: Non-enlarged, mobile, normal contour.    Adnexa: ovaries non-enlarged, no adnexal masses  Rectal: deferred Extremities: no edema, erythema, or tenderness Neurologic: Grossly intact Psychiatric: mood appropriate, affect full    Assessment: 23 y.o. G1P0000 presenting for 6 week postpartum visit   Plan: Problem List Items Addressed This Visit    None    Visit Diagnoses    6 weeks postpartum follow-up    -  Primary   Encounter for insertion of mirena IUD           1) Contraception - Education given regarding options for contraception, as well as compatibility with breast feeding if applicable.  Patient plans on IUD for contraception.  2)  Pap - ASCCP guidelines and rationale discussed.  Patient opts for every 3 years screening interval  3) Patient underwent screening for postpartum depression with no signs of depression  4) Return in about 4 weeks (around 10/03/2019) for IUD string check.   Tresea MallJane Monserath Neff, CNM [facility] 09/05/2019, 4:35 PM     GYNECOLOGY OFFICE PROCEDURE NOTE  Farrel ConnersKayleigh M Miles is a 23 y.o. G1P0000 here for Mirena IUD insertion. No GYN concerns.  Last pap smear was on 12/19/2018 and was normal.  The patient is currently using  postpartum abstinence for contraception and her LMP is No LMP recorded..  The indication for her IUD is contraception/cycle control.  IUD Insertion Procedure Note Patient identified, informed consent performed, consent signed.   Discussed risks of irregular bleeding, cramping, infection, malpositioning, expulsion or uterine perforation of the IUD (1:1000 placements)  which may require further procedure such as laparoscopy.  IUD while effective at preventing pregnancy do not prevent transmission of sexually transmitted diseases and use of barrier methods for this purpose was discussed. Time out was performed.    Speculum placed in the vagina.  Cervix visualized.  Cleaned with Betadine x 2.  Grasped posteriorly with a single tooth tenaculum.  Uterus sounded to 7 cm. IUD placed per manufacturer's recommendations.  Strings trimmed to 3 cm. Tenaculum was removed, good hemostasis noted.  Patient tolerated procedure well.   Patient was given post-procedure instructions.  She was advised to have backup  contraception for one week.  Patient was also asked to check IUD strings periodically and follow up in 6 weeks for IUD check.  IUD insertion CPT 58300,  Skyla [id number] Mirena [id number] Liletta J7297 Paraguard [id number] Rutha Bouchard [phone] Modifer 25, plus Modifer 79 is done during a global billing visit    Tresea Mall, CNM [facility]

## 2019-10-10 ENCOUNTER — Ambulatory Visit (INDEPENDENT_AMBULATORY_CARE_PROVIDER_SITE_OTHER): Payer: Medicaid Other | Admitting: Advanced Practice Midwife

## 2019-10-10 ENCOUNTER — Encounter: Payer: Self-pay | Admitting: Advanced Practice Midwife

## 2019-10-10 ENCOUNTER — Other Ambulatory Visit: Payer: Self-pay

## 2019-10-10 VITALS — BP 122/74 | Ht 62.0 in | Wt 162.0 lb

## 2019-10-10 DIAGNOSIS — Z30431 Encounter for routine checking of intrauterine contraceptive device: Secondary | ICD-10-CM

## 2019-10-10 NOTE — Progress Notes (Signed)
       IUD String Check  Subjctive: [name] presents for IUD string check.  She had a Mirena placed 4 weeks ago.  Since placement of her IUD she has had daily spotting.  She admits to short periods of pelvic pain that started about 2 weeks post insertion especially when she moves a certain way.  She has had intercourse since placement.  She has not checked the strings.  She denies any fever, chills, nausea, vomiting, or other complaints.    She has noticed that her nipples are invaginated since she stopped breastfeeding. I suggested she call lactation support at [facility] to discuss.  Objective: BP 122/74   Ht 5\' 2"  (1.575 m)   Wt 162 lb (73.5 kg)   BMI 29.63 kg/m  Gen:  NAD, A&Ox3 HEENT: normocephalic, anicteric Pulmonary: no increased work of breathing Pelvic: Normal appearing external female genitalia, normal vaginal epithelium, no abnormal discharge. Normal appearing cervix.  IUD strings visible and 3 cm in length similar to at the time of placement. Psychiatric: mood appropriate, affect full Neurologic: grossly normal    Assessment: 23 y.o. year old female status post prior Mirena IUD placement 4 weeks ago, doing well.  Plan: 1.  The patient was given instructions to check her IUD strings monthly and call with any problems or concerns.  She should call for fevers, chills, abnormal vaginal discharge, pelvic pain, or other complaints. 2.  She will return for a annual exam in 1 year.  All questions answered.   Rod Can, CNM 10/10/2019 1:12 PM

## 2019-10-30 ENCOUNTER — Ambulatory Visit: Payer: Medicaid Other | Attending: Internal Medicine

## 2019-10-30 DIAGNOSIS — B349 Viral infection, unspecified: Secondary | ICD-10-CM | POA: Diagnosis not present

## 2019-10-30 DIAGNOSIS — Z20822 Contact with and (suspected) exposure to covid-19: Secondary | ICD-10-CM | POA: Diagnosis not present

## 2019-10-30 DIAGNOSIS — R5383 Other fatigue: Secondary | ICD-10-CM | POA: Diagnosis not present

## 2019-11-22 DIAGNOSIS — J029 Acute pharyngitis, unspecified: Secondary | ICD-10-CM | POA: Diagnosis not present

## 2019-11-22 DIAGNOSIS — R070 Pain in throat: Secondary | ICD-10-CM | POA: Diagnosis not present

## 2019-11-22 DIAGNOSIS — J039 Acute tonsillitis, unspecified: Secondary | ICD-10-CM | POA: Diagnosis not present

## 2019-12-04 ENCOUNTER — Encounter: Payer: Self-pay | Admitting: Obstetrics and Gynecology

## 2019-12-18 NOTE — Progress Notes (Deleted)
Margaretann Loveless, PA-C   No chief complaint on file.   HPI:      [name] is a 24 y.o. G1P0000 who LMP was No LMP recorded. (Menstrual status: IUD)., presents today for *** Mirena placed 09/05/19 at [facility] visit  Past Medical History:  Diagnosis Date  . Anxiety   . Asthma    sports induced  . Bipolar disorder ([facility])   . Chronic pelvic pain in female 09/09/2016  . Depression   . Endometriosis determined by laparoscopy 09/09/2016  . Ovarian cyst   . Pilonidal cyst   . PONV (postoperative nausea and vomiting)     Past Surgical History:  Procedure Laterality Date  . COLONOSCOPY WITH ESOPHAGOGASTRODUODENOSCOPY (EGD)    . LAPAROSCOPY N/A 09/09/2016   Procedure: LAPAROSCOPY DIAGNOSTIC, FULGERATION OF ENDOMETRIOSIS;  Surgeon: Conard Novak, MD;  Location: [facility] ORS;  Service: Gynecology;  Laterality: N/A;  . PILONIDAL CYST EXCISION N/A 04/10/2015   Procedure: CYST EXCISION PILONIDAL EXTENSIVE;  Surgeon: Kieth Brightly, MD;  Location: [facility] ORS;  Service: General;  Laterality: N/A;    Family History  Problem Relation Age of Onset  . Hyperlipidemia Father   . GER disease Father     Social History   Socioeconomic History  . Marital status: Married    Spouse name: Brett Canales  . Number of children: Not on file  . Years of education: Not on file  . Highest education level: Not on file  Occupational History  . Not on file  Tobacco Use  . Smoking status: Never Smoker  . Smokeless tobacco: Never Used  Substance and Sexual Activity  . Alcohol use: Not Currently    Alcohol/week: 0.0 standard drinks    Comment: drinks on friday nights  . Drug use: Not Currently    Types: Marijuana  . Sexual activity: Yes    Birth control/protection: None    Comment: undecided  Other Topics Concern  . Not on file  Social History Narrative  . Not on file   Social Determinants of Health   Financial Resource Strain: Low Risk   . Difficulty of Paying Living Expenses:  Not hard at all  Food Insecurity: No Food Insecurity  . Worried About Programme researcher, broadcasting/film/video in the Last Year: Never true  . Ran Out of Food in the Last Year: Never true  Transportation Needs: No Transportation Needs  . Lack of Transportation (Medical): No  . Lack of Transportation (Non-Medical): No  Physical Activity: Insufficiently Active  . Days of Exercise per Week: 3 days  . Minutes of Exercise per Session: 20 min  Stress: No Stress Concern Present  . Feeling of Stress : Not at all  Social Connections: Slightly Isolated  . Frequency of Communication with Friends and Family: Three times a week  . Frequency of Social Gatherings with Friends and Family: Once a week  . Attends Religious Services: 1 to 4 times per year  . Active Member of Clubs or Organizations: No  . Attends Banker Meetings: Never  . Marital Status: Married  Catering manager Violence: Not At Risk  . Fear of Current or Ex-Partner: No  . Emotionally Abused: No  . Physically Abused: No  . Sexually Abused: No    Outpatient Medications Prior to Visit  Medication Sig Dispense Refill  . ibuprofen (ADVIL) 600 MG tablet Take 1 tablet (600 mg total) by mouth every 6 (six) hours. (Patient not taking: Reported on 09/05/2019) 30 tablet 0  .  levonorgestrel (MIRENA) 20 MCG/24HR IUD 1 each by Intrauterine route once.    . ondansetron (ZOFRAN ODT) 4 MG disintegrating tablet Take 1 tablet (4 mg total) by mouth every 8 (eight) hours as needed for nausea or vomiting. (Patient not taking: Reported on 09/05/2019) 20 tablet 0  . oxyCODONE-acetaminophen (PERCOCET) 5-325 MG tablet Take 1 tablet by mouth every 4 (four) hours as needed for severe pain. (Patient not taking: Reported on 09/05/2019) 20 tablet 0  . Prenatal Vit-Fe Fumarate-FA (MULTIVITAMIN-PRENATAL) 27-0.8 MG TABS tablet Take 1 tablet by mouth daily at 12 noon.     No facility-administered medications prior to visit.      ROS:  Review of Systems BREAST: No  symptoms   OBJECTIVE:   Vitals:  There were no vitals taken for this visit.  Physical Exam  Results: No results found for this or any previous visit (from the past 24 hour(s)).   Assessment/Plan: No diagnosis found.    No orders of the defined types were placed in this encounter.     No follow-ups on file.  Lashundra Shiveley B. Carnelius Hammitt, PA-C 12/18/2019 1:49 PM

## 2019-12-19 ENCOUNTER — Ambulatory Visit: Payer: Medicaid Other | Admitting: Obstetrics and Gynecology

## 2020-01-22 ENCOUNTER — Other Ambulatory Visit: Payer: Self-pay

## 2020-01-22 ENCOUNTER — Ambulatory Visit (INDEPENDENT_AMBULATORY_CARE_PROVIDER_SITE_OTHER): Payer: Medicaid Other | Admitting: Obstetrics and Gynecology

## 2020-01-22 VITALS — BP 118/74 | Ht 62.0 in | Wt 171.0 lb

## 2020-01-22 DIAGNOSIS — G8929 Other chronic pain: Secondary | ICD-10-CM | POA: Diagnosis not present

## 2020-01-22 DIAGNOSIS — N941 Unspecified dyspareunia: Secondary | ICD-10-CM | POA: Diagnosis not present

## 2020-01-22 DIAGNOSIS — R1030 Lower abdominal pain, unspecified: Secondary | ICD-10-CM | POA: Diagnosis not present

## 2020-01-22 DIAGNOSIS — R140 Abdominal distension (gaseous): Secondary | ICD-10-CM | POA: Diagnosis not present

## 2020-01-22 DIAGNOSIS — R102 Pelvic and perineal pain unspecified side: Secondary | ICD-10-CM

## 2020-01-22 NOTE — Progress Notes (Signed)
Obstetrics & Gynecology Office Visit   Chief Complaint  Patient presents with  . Pelvic Pain   History of Present Illness: 24 y.o. G48P1001 female who presents with a 2 month history of lower abdominal pain, complete loss of sex drive, sometimes pain during intercourse, feeling full, bloating, breast pain.  She is six months postpartum from a vaginal delivery. She is no longer breastfeeding (for the past 4 months). She also notes mood swings. She also notes a thick white leaking out of her left nipple.  She notes both nipples are inverted since delivery.     Her abdominal pain is generally in her lower abdomen, but can be on either side.  She describes the pain as sometimes sharp, but mostly cramping.  She rates the pain as 5/10.  The pain doesn't radiate.  Aggravating factors: sex.  Alleviating factors: none that she can think of. Associated symptoms: nausea, indigestion.  The pain with intercourse is notable upon initial penetration or depending on position.  She also notes numbness that are positionally dependant.  She notes the complete loss of sex drive over the past two months.  When she has had intercourse she has become more aroused during intercourse, but he usually initiates things.  If she is uncomfortable, she doesn't enjoy intercourse.   She constantly feels full and bloated over the past two months.  She noted breast pain a couple of weeks ago.  This is not constant. She describes this pain as soreness.   The mood swings have been present for about the past month.  Right before she is supposed to get her period she is more easily angered or annoyed.  She denies feeling depressed or anxious.  This occurs the week before her period and when her period would have started the symptoms stop.    Past Medical History:  Diagnosis Date  . Anxiety   . Asthma    sports induced  . Bipolar disorder ([facility])   . Chronic pelvic pain in female 09/09/2016  . Depression   . Endometriosis determined by  laparoscopy 09/09/2016  . Ovarian cyst   . Pilonidal cyst   . PONV (postoperative nausea and vomiting)     Past Surgical History:  Procedure Laterality Date  . COLONOSCOPY WITH ESOPHAGOGASTRODUODENOSCOPY (EGD)    . LAPAROSCOPY N/A 09/09/2016   Procedure: LAPAROSCOPY DIAGNOSTIC, FULGERATION OF ENDOMETRIOSIS;  Surgeon: Conard Novak, MD;  Location: [facility] ORS;  Service: Gynecology;  Laterality: N/A;  . PILONIDAL CYST EXCISION N/A 04/10/2015   Procedure: CYST EXCISION PILONIDAL EXTENSIVE;  Surgeon: Kieth Brightly, MD;  Location: [facility] ORS;  Service: General;  Laterality: N/A;   Gynecologic History: No LMP recorded. (Menstrual status: IUD).  Obstetric History: G1P1001  Family History  Problem Relation Age of Onset  . Hyperlipidemia Father   . GER disease Father     Social History   Socioeconomic History  . Marital status: Married    Spouse name: Brett Canales  . Number of children: Not on file  . Years of education: Not on file  . Highest education level: Not on file  Occupational History  . Not on file  Tobacco Use  . Smoking status: Never Smoker  . Smokeless tobacco: Never Used  Substance and Sexual Activity  . Alcohol use: Not Currently    Alcohol/week: 0.0 standard drinks    Comment: drinks on friday nights  . Drug use: Not Currently    Types: Marijuana  . Sexual activity: Yes    Birth control/protection:  None    Comment: undecided  Other Topics Concern  . Not on file  Social History Narrative  . Not on file   Social Determinants of Health   Financial Resource Strain: Low Risk   . Difficulty of Paying Living Expenses: Not hard at all  Food Insecurity: No Food Insecurity  . Worried About Charity fundraiser in the Last Year: Never true  . Ran Out of Food in the Last Year: Never true  Transportation Needs: No Transportation Needs  . Lack of Transportation (Medical): No  . Lack of Transportation (Non-Medical): No  Physical Activity: Insufficiently Active  .  Days of Exercise per Week: 3 days  . Minutes of Exercise per Session: 20 min  Stress: No Stress Concern Present  . Feeling of Stress : Not at all  Social Connections: Slightly Isolated  . Frequency of Communication with Friends and Family: Three times a week  . Frequency of Social Gatherings with Friends and Family: Once a week  . Attends Religious Services: 1 to 4 times per year  . Active Member of Clubs or Organizations: No  . Attends Archivist Meetings: Never  . Marital Status: Married  Human resources officer Violence: Not At Risk  . Fear of Current or Ex-Partner: No  . Emotionally Abused: No  . Physically Abused: No  . Sexually Abused: No    Allergies  Allergen Reactions  . Aripiprazole Nausea And Vomiting  . Vicodin [Hydrocodone-Acetaminophen] Nausea And Vomiting    Prior to Admission medications   Medication Sig Start Date End Date Taking? Authorizing Provider  levonorgestrel (MIRENA) 20 MCG/24HR IUD 1 each by Intrauterine route once.   Yes [provider]    Review of Systems  Constitutional: Negative.   HENT: Negative.   Eyes: Negative.   Respiratory: Negative.   Cardiovascular: Negative.   Gastrointestinal: Positive for abdominal pain and nausea. Negative for blood in stool, constipation, diarrhea, heartburn, melena and vomiting.  Genitourinary: Negative.   Musculoskeletal: Negative.   Skin: Negative.   Neurological: Negative.   Psychiatric/Behavioral: Negative.        +mood swings     Physical Exam BP 118/74   Ht 5\' 2"  (1.575 m)   Wt 171 lb (77.6 kg)   BMI 31.28 kg/m  No LMP recorded. (Menstrual status: IUD). Physical Exam Constitutional:      General: She is not in acute distress.    Appearance: Normal appearance. She is well-developed.  Genitourinary:     Pelvic exam was performed with patient in the lithotomy position.     Vulva, inguinal canal, urethra, bladder, vagina and uterus normal.     No posterior fourchette tenderness,  injury or lesion present.     No cervical friability, lesion, bleeding or polyp.     IUD strings visualized.     Right adnexa tender.     Left adnexa tender.  HENT:     Head: Normocephalic and atraumatic.  Eyes:     General: No scleral icterus.    Conjunctiva/sclera: Conjunctivae normal.  Cardiovascular:     Rate and Rhythm: Normal rate and regular rhythm.     Heart sounds: No murmur. No friction rub. No gallop.   Pulmonary:     Effort: Pulmonary effort is normal. No respiratory distress.     Breath sounds: Normal breath sounds. No wheezing or rales.  Abdominal:     General: Bowel sounds are normal. There is no distension.     Palpations: Abdomen is soft. There  is no mass.     Tenderness: There is abdominal tenderness (mild ttp midline). There is no guarding or rebound.  Musculoskeletal:        General: Normal range of motion.     Cervical back: Normal range of motion and neck supple.  Neurological:     General: No focal deficit present.     Mental Status: She is alert and oriented to person, place, and time.     Cranial Nerves: No cranial nerve deficit.  Skin:    General: Skin is warm and dry.     Findings: No erythema.  Psychiatric:        Mood and Affect: Mood normal.        Behavior: Behavior normal.        Judgment: Judgment normal.    Female chaperone present for pelvic and breast  portions of the physical exam  Bedside transabdominal ultrasound performed by me: IUD appears to be in correct location within the uterus. No obvious cysts or adnexal masses.   Assessment: 24 y.o. G42P0000 female here for  1. Chronic pelvic pain in female   2. Dyspareunia, female   3. Bloating   4. Lower abdominal pain      Plan: Problem List Items Addressed This Visit      Other   Chronic pelvic pain in female - Primary    Other Visit Diagnoses    Dyspareunia, female       Bloating       Lower abdominal pain         The patient has a history of endometriosis.  However, she  does not believe her current symptoms are similar to her endometriosis symptoms.  She has a wide array of symptoms spanning multiple systems.  We discussed multiple possible etiologies.  Further, we discussed psychiatric conditions which might cause this array of symptoms.  She feels like she is doing well from a mood and psychiatric standpoint, apart from mood swings, which appeared to happen just before she would have a menses.  We discussed removal of her IUD and alternative forms of contraception.  We discussed that the IUD is a treatment for endometriosis.  However, it does not work well for every patient.  So, other treatments might be beneficial.  We discussed the use of oral contraceptive pills.  She states that she is not good at remembering to take pills.  She has tried Depo-Provera, Nexplanon, and pills.  She does not want to try any of these methods at this time.  For the moment she just wanted to make sure that everything looked okay with the IUD.  I offered to remove her IUD.  She declines at this time.  If symptoms worsen, she will return to clinic for further assessment and treatment.  Otherwise we will see her for routine care.  She declined STD testing today.   A total of 30 minutes were spent face-to-face with the patient as well as preparation, review, communication, and documentation during this encounter.    Thomasene Mohair, MD 01/22/2020 4:48 PM

## 2020-01-23 ENCOUNTER — Encounter: Payer: Self-pay | Admitting: Obstetrics and Gynecology

## 2020-02-06 NOTE — Progress Notes (Signed)
Chief Complaint  Patient presents with  . IUD removal    not interested in new [facility]  . Armed forces logistics/support/administrative officer  . Breast exam    white thick discharge from nipples     History of Present Illness:  Madison Dixon is a 24 y.o. that had a Mirena IUD placed approximately 5 months ago at [facility] visit. Since that time, has had abd pain, bloating, mood changes, stomach "issues".  IUD confirmed in correct location by Dr. Glennon Mac with bedside u/s 3/21. Just wants to have it removed now due to side effects. She is amenorrheic. Plans to use condoms and track cycle. Pt with hx of endometriosis but sx dont' feel similar. Has done pills, depo, nexplanon in past.  She has had white nipple d/c recently. At one time it was spontaneous, now only with manipulation. Pt is PP 9/20, only breastfed for 1 mo. No masses. Nipples have also inverted since BF.   Past Medical History:  Diagnosis Date  . Anxiety   . Asthma    sports induced  . Bipolar disorder ([place])   . Chronic pelvic pain in female 09/09/2016  . Depression   . Endometriosis determined by laparoscopy 09/09/2016  . Ovarian cyst   . Pilonidal cyst   . PONV (postoperative nausea and vomiting)    Past Surgical History:  Procedure Laterality Date  . COLONOSCOPY WITH ESOPHAGOGASTRODUODENOSCOPY (EGD)    . LAPAROSCOPY N/A 09/09/2016   Procedure: LAPAROSCOPY DIAGNOSTIC, FULGERATION OF ENDOMETRIOSIS;  Surgeon: Will Bonnet, MD;  Location: [facility] ORS;  Service: Gynecology;  Laterality: N/A;  . PILONIDAL CYST EXCISION N/A 04/10/2015   Procedure: CYST EXCISION PILONIDAL EXTENSIVE;  Surgeon: Christene Lye, MD;  Location: [facility] ORS;  Service: General;  Laterality: N/A;   Family History  Problem Relation Age of Onset  . Hyperlipidemia Father   . GER disease Father    Review of Systems  Constitutional: Negative for fever.  Gastrointestinal: Negative for blood in stool, constipation, diarrhea, nausea and vomiting.  Genitourinary: Positive for pelvic  pain. Negative for dyspareunia, dysuria, flank pain, frequency, hematuria, urgency, vaginal bleeding, vaginal discharge and vaginal pain.  Musculoskeletal: Negative for back pain.  Skin: Negative for rash.  BREAST: nipple d/c   BP 114/80   Ht 5\' 2"  (1.575 m)   Wt 171 lb (77.6 kg)   Breastfeeding No   BMI 31.28 kg/m   Physical Exam Constitutional:      General: She is not in acute distress.    Appearance: She is well-developed.  Genitourinary:     Vulva, vagina, cervix, uterus, right adnexa and left adnexa normal.     No vulval lesion, tenderness or ulcerations noted.     No vaginal discharge, erythema, tenderness or bleeding.     Uterus is not enlarged or tender.     No right or left adnexal mass present.     Right adnexa not tender.     Left adnexa not tender.  Pulmonary:     Effort: Pulmonary effort is normal.  Chest:     Breasts:        Right: Inverted nipple present. No mass, nipple discharge, skin change or tenderness.        Left: Inverted nipple present. No mass, nipple discharge, skin change or tenderness.  Musculoskeletal:        General: Normal range of motion.  Neurological:     General: No focal deficit present.     Mental Status: She is alert and oriented  to person, place, and time.     Cranial Nerves: No cranial nerve deficit.  Skin:    General: Skin is warm and dry.  Psychiatric:        Mood and Affect: Mood normal.        Behavior: Behavior normal.        Thought Content: Thought content normal.        Judgment: Judgment normal.  Vitals and nursing note reviewed.     IUD Removal Strings of IUD identified and grasped.  IUD removed without problem with ring forceps.  Pt tolerated this well.  IUD noted to be intact.  Assessment:  Galactorrhea - Plan: TSH, Prolactin; Pt is PP 9/20, stopped BF 10/20. Check labs. If neg, reassurance. F/u prn.  Inverted nipples--bilat; neg exam, reassurance. F/u prn.  Encounter for IUD removal    Plan: IUD removed  and plan for contraception is condoms. She was amenable to this plan.  [name] B. Kelleen Stolze, PA-C 02/07/2020 9:30 AM

## 2020-02-07 ENCOUNTER — Encounter: Payer: Self-pay | Admitting: Obstetrics and Gynecology

## 2020-02-07 ENCOUNTER — Other Ambulatory Visit: Payer: Self-pay

## 2020-02-07 ENCOUNTER — Ambulatory Visit (INDEPENDENT_AMBULATORY_CARE_PROVIDER_SITE_OTHER): Payer: Medicaid Other | Admitting: Obstetrics and Gynecology

## 2020-02-07 VITALS — BP 114/80 | Ht 62.0 in | Wt 171.0 lb

## 2020-02-07 DIAGNOSIS — N643 Galactorrhea not associated with childbirth: Secondary | ICD-10-CM | POA: Diagnosis not present

## 2020-02-07 DIAGNOSIS — N6459 Other signs and symptoms in breast: Secondary | ICD-10-CM | POA: Insufficient documentation

## 2020-02-07 DIAGNOSIS — Z30432 Encounter for removal of intrauterine contraceptive device: Secondary | ICD-10-CM | POA: Diagnosis not present

## 2020-02-07 NOTE — Patient Instructions (Signed)
I value your feedback and entrusting us with your care. If you get a [place] patient survey, I would appreciate you taking the time to let us know about your experience today. Thank you!  As of October 04, 2019, your lab results will be released to your MyChart immediately, before I even have a chance to see them. Please give me time to review them and contact you if there are any abnormalities. Thank you for your patience.  

## 2020-02-08 LAB — TSH: TSH: 1.94 u[IU]/mL (ref 0.450–4.500)

## 2020-02-08 LAB — PROLACTIN: Prolactin: 5.3 ng/mL (ref 4.8–23.3)

## 2020-04-10 DIAGNOSIS — J039 Acute tonsillitis, unspecified: Secondary | ICD-10-CM | POA: Diagnosis not present

## 2020-04-10 DIAGNOSIS — R070 Pain in throat: Secondary | ICD-10-CM | POA: Diagnosis not present

## 2020-04-11 DIAGNOSIS — Z20822 Contact with and (suspected) exposure to covid-19: Secondary | ICD-10-CM | POA: Diagnosis not present

## 2020-05-08 ENCOUNTER — Telehealth (INDEPENDENT_AMBULATORY_CARE_PROVIDER_SITE_OTHER): Payer: Medicaid Other | Admitting: Physician Assistant

## 2020-05-08 DIAGNOSIS — H60332 Swimmer's ear, left ear: Secondary | ICD-10-CM

## 2020-05-08 MED ORDER — OFLOXACIN 0.3 % OP SOLN: OPHTHALMIC | 0 refills | Status: DC

## 2020-05-08 NOTE — Progress Notes (Signed)
    MyChart Video Visit    Virtual Visit via Video Note   This visit type was conducted due to national recommendations for restrictions regarding the COVID-19 Pandemic (e.g. social distancing) in an effort to limit this patient's exposure and mitigate transmission in our community. This patient is at least at moderate risk for complications without adequate follow up. This format is felt to be most appropriate for this patient at this time. Physical exam was limited by quality of the video and audio technology used for the visit.   Patient location: Home Provider location: Office    I discussed the limitations of evaluation and management by telemedicine and the availability of in person appointments. The patient expressed understanding and agreed to proceed.  Patient: Madison Dixon   DOB: [birth year]   24 y.o. Female  MRN: [id number] Visit Date: 05/08/2020  Today's healthcare provider: Trey Sailors, PA-C   Chief Complaint  Patient presents with  . Ear Pain   Subjective    Otalgia    Patient presents for a week of left ear pain. She denies right ear pain. Denies fevers or chills. Some vomiting. Denies SOB. She reports ear hurts to the touch, to talk on a cell phone and lie down. She has some hearing loss. Denies ear drainage.      Medications: Outpatient Medications Prior to Visit  Medication Sig  . levonorgestrel (MIRENA) 20 MCG/24HR IUD 1 each by Intrauterine route once.   No facility-administered medications prior to visit.    Review of Systems  HENT: Positive for ear pain.       Objective    There were no vitals taken for this visit.   Physical Exam Constitutional:      Appearance: Normal appearance.  Pulmonary:     Effort: Pulmonary effort is normal. No respiratory distress.  Neurological:     Mental Status: She is alert.  Psychiatric:        Mood and Affect: Mood normal.        Behavior: Behavior normal.        Assessment & Plan    1.  Acute swimmer's ear of left side  - ofloxacin (OCUFLOX) 0.3 % ophthalmic solution; 10 drops into affected ear once daily for 7 days.  Dispense: 1.4 mL; Refill: 0    No follow-ups on file.     I discussed the assessment and treatment plan with the patient. The patient was provided an opportunity to ask questions and all were answered. The patient agreed with the plan and demonstrated an understanding of the instructions.   The patient was advised to call back or seek an in-person evaluation if the symptoms worsen or if the condition fails to improve as anticipated.   ITrey Sailors, PA-C, have reviewed all documentation for this visit. The documentation on 05/08/20 for the exam, diagnosis, procedures, and orders are all accurate and complete.   Maryella Shivers [facility] [phone] (phone) [phone] (fax)  [facility]

## 2020-05-31 DIAGNOSIS — H60391 Other infective otitis externa, right ear: Secondary | ICD-10-CM | POA: Diagnosis not present

## 2020-07-05 ENCOUNTER — Encounter: Payer: Self-pay | Admitting: Emergency Medicine

## 2020-07-05 ENCOUNTER — Emergency Department: Payer: Medicaid Other

## 2020-07-05 ENCOUNTER — Other Ambulatory Visit: Payer: Self-pay

## 2020-07-05 DIAGNOSIS — R0789 Other chest pain: Secondary | ICD-10-CM | POA: Diagnosis not present

## 2020-07-05 DIAGNOSIS — R079 Chest pain, unspecified: Secondary | ICD-10-CM | POA: Insufficient documentation

## 2020-07-05 DIAGNOSIS — R0602 Shortness of breath: Secondary | ICD-10-CM | POA: Insufficient documentation

## 2020-07-05 DIAGNOSIS — J45909 Unspecified asthma, uncomplicated: Secondary | ICD-10-CM | POA: Diagnosis not present

## 2020-07-05 LAB — BASIC METABOLIC PANEL
Anion gap: 10 (ref 5–15)
BUN: 18 mg/dL (ref 6–20)
CO2: 28 mmol/L (ref 22–32)
Calcium: 10 mg/dL (ref 8.9–10.3)
Chloride: 101 mmol/L (ref 98–111)
Creatinine, Ser: 0.8 mg/dL (ref 0.44–1.00)
GFR calc Af Amer: >60 mL/min (ref >60)
GFR calc non Af Amer: >60 mL/min (ref >60)
Glucose, Bld: 108 mg/dL — ABNORMAL HIGH (ref 70–99)
Potassium: 3.4 mmol/L — ABNORMAL LOW (ref 3.5–5.1)
Sodium: 139 mmol/L (ref 135–145)

## 2020-07-05 LAB — URINALYSIS, COMPLETE (UACMP) WITH MICROSCOPIC
Bacteria, UA: NONE SEEN
Bilirubin Urine: NEGATIVE
Glucose, UA: NEGATIVE mg/dL
Hgb urine dipstick: NEGATIVE
Ketones, ur: NEGATIVE mg/dL
Leukocytes,Ua: NEGATIVE
Nitrite: NEGATIVE
Protein, ur: NEGATIVE mg/dL
Specific Gravity, Urine: 1.018 (ref 1.005–1.030)
pH: 5 (ref 5.0–8.0)

## 2020-07-05 LAB — CBC
HCT: 37.3 % (ref 36.0–46.0)
Hemoglobin: 13.1 g/dL (ref 12.0–15.0)
MCH: 30.7 pg (ref 26.0–34.0)
MCHC: 35.1 g/dL (ref 30.0–36.0)
MCV: 87.4 fL (ref 80.0–100.0)
Platelets: 317 10*3/uL (ref 150–400)
RBC: 4.27 MIL/uL (ref 3.87–5.11)
RDW: 12.1 % (ref 11.5–15.5)
WBC: 11 10*3/uL — ABNORMAL HIGH (ref 4.0–10.5)
nRBC: 0 % (ref 0.0–0.2)

## 2020-07-05 LAB — TROPONIN I (HIGH SENSITIVITY): Troponin I (High Sensitivity): 4 ng/L (ref <18)

## 2020-07-05 NOTE — ED Triage Notes (Signed)
t arrives POV and ambulatory to triage with chest pain x2 weeks. Pt also states that she feels SOB. Pt is in NAD.

## 2020-07-06 ENCOUNTER — Emergency Department
Admission: EM | Admit: 2020-07-06 | Discharge: 2020-07-06 | Disposition: A | Discharge status: Home / Self Care | Payer: Medicaid Other | Attending: Emergency Medicine | Admitting: Emergency Medicine

## 2020-07-06 DIAGNOSIS — R079 Chest pain, unspecified: Secondary | ICD-10-CM

## 2020-07-06 LAB — PREGNANCY, URINE: Preg Test, Ur: NEGATIVE

## 2020-07-06 LAB — TROPONIN I (HIGH SENSITIVITY): Troponin I (High Sensitivity): 8 ng/L (ref <18)

## 2020-07-06 NOTE — Discharge Instructions (Addendum)
Follow-up with your primary care provider if any continued problems or concerns.  Return to the emergency department if any severe worsening of your symptoms.  Also a list of potassium rich foods was listed on your discharge papers.  Your potassium was 1/10 of a point low and it is not necessary for you to take medication by mouth.

## 2020-07-06 NOTE — ED Provider Notes (Signed)
[facility] Emergency Department Provider Note  ____________________________________________   First MD Initiated Contact with Patient 07/06/20 [phone]     (approximate)  I have reviewed the triage vital signs and the nursing notes.   HISTORY  Chief Complaint Chest Pain   HPI Madison Dixon is a 24 y.o. female presents to the emergency department with complaint of anterior chest wall pain for last 2 weeks.  Patient states that she began feeling slightly shortness of breath and became anxious.  She is also worried that she was "prediabetic".  She denies any symptoms of Covid such as cough, congestion, fever, chills, nausea or vomiting.  Patient has not been diagnosed with Covid.  Patient states that she has anxiety issues and was worried about diabetes and heart disease.  Rates her pain as 6 out of 10.       Past Medical History:  Diagnosis Date  . Anxiety   . Asthma    sports induced  . Bipolar disorder ([facility])   . Chronic pelvic pain in female 09/09/2016  . Depression   . Endometriosis determined by laparoscopy 09/09/2016  . Ovarian cyst   . Pilonidal cyst   . PONV (postoperative nausea and vomiting)     Patient Active Problem List   Diagnosis Date Noted  . Inverted nipple 02/07/2020  . Pregnancy 07/20/2019  . Uterine contractions during pregnancy 07/20/2019  . Postpartum care following vaginal delivery 07/20/2019  . Indication for care in labor and delivery, antepartum 07/11/2019  . Premature uterine contractions 06/19/2019  . Abdominal pain during pregnancy in third trimester 06/15/2019  . Supervision of normal first pregnancy 12/19/2018  . Chronic pelvic pain in female 09/09/2016  . Endometriosis determined by laparoscopy 09/09/2016  . Allergic rhinitis 05/10/2008    Past Surgical History:  Procedure Laterality Date  . COLONOSCOPY WITH ESOPHAGOGASTRODUODENOSCOPY (EGD)    . LAPAROSCOPY N/A 09/09/2016   Procedure: LAPAROSCOPY DIAGNOSTIC,  FULGERATION OF ENDOMETRIOSIS;  Surgeon: Conard Novak, MD;  Location: [facility] ORS;  Service: Gynecology;  Laterality: N/A;  . PILONIDAL CYST EXCISION N/A 04/10/2015   Procedure: CYST EXCISION PILONIDAL EXTENSIVE;  Surgeon: Kieth Brightly, MD;  Location: [facility] ORS;  Service: General;  Laterality: N/A;    Prior to Admission medications   Medication Sig Start Date End Date Taking? Authorizing Provider  levonorgestrel (MIRENA) 20 MCG/24HR IUD 1 each by Intrauterine route once.    [provider]  ofloxacin (OCUFLOX) 0.3 % ophthalmic solution 10 drops into affected ear once daily for 7 days. 05/08/20   Trey Sailors, PA-C    Allergies Aripiprazole and Vicodin [hydrocodone-acetaminophen]  Family History  Problem Relation Age of Onset  . Hyperlipidemia Father   . GER disease Father     Social History Social History   Tobacco Use  . Smoking status: Never Smoker  . Smokeless tobacco: Never Used  Vaping Use  . Vaping Use: Never used  Substance Use Topics  . Alcohol use: Not Currently    Alcohol/week: 0.0 standard drinks    Comment: drinks on friday nights  . Drug use: Not Currently    Types: Marijuana    Review of Systems Constitutional: No fever/chills Eyes: No visual changes. ENT: No sore throat. Cardiovascular: Positive chest pain. Respiratory: Positive shortness of breath.  Negative for cough. Gastrointestinal: No abdominal pain.  No nausea, no vomiting.  No diarrhea.  No constipation. Musculoskeletal: Negative for back pain. Skin: Negative for rash. Neurological: Negative for headaches, focal weakness or numbness. Psychological:  History of bipolar, anxiety, depression. ____________________________________________   PHYSICAL EXAM:  VITAL SIGNS: ED Triage Vitals [07/05/20 2127]  Enc Vitals Group     BP 127/80     Pulse Rate 99     Resp 18     Temp 98.4 F (36.9 C)     Temp Source Oral     SpO2 100 %     Weight 171 lb (77.6 kg)     Height 5\' 1"   (1.549 m)     Head Circumference      Peak Flow      Pain Score 6     Pain Loc      Pain Edu?      Excl. in GC?     Constitutional: Alert and oriented. Well appearing and in no acute distress.  Able to speak in complete sentences without any shortness of breath. Eyes: Conjunctivae are normal.  Head: Atraumatic. Neck: No stridor.   Cardiovascular: Normal rate, regular rhythm. Grossly normal heart sounds.  Good peripheral circulation. Respiratory: Normal respiratory effort.  No retractions. Lungs CTAB. Gastrointestinal: Soft and nontender. No distention.  Bowel sounds normoactive x4 quadrants. Musculoskeletal: Moves upper and lower extremities with any difficulty.  Normal gait was noted.. Neurologic:  Normal speech and language. No gross focal neurologic deficits are appreciated. No gait instability. Skin:  Skin is warm, dry and intact. No rash noted. Psychiatric: Mood and affect are normal. Speech and behavior are normal.  ____________________________________________   LABS (all labs ordered are listed, but only abnormal results are displayed)  Labs Reviewed  BASIC METABOLIC PANEL - Abnormal; Notable for the following components:      Result Value   Potassium 3.4 (*)    Glucose, Bld 108 (*)    All other components within normal limits  CBC - Abnormal; Notable for the following components:   WBC 11.0 (*)    All other components within normal limits  URINALYSIS, COMPLETE (UACMP) WITH MICROSCOPIC - Abnormal; Notable for the following components:   Color, Urine YELLOW (*)    APPearance CLEAR (*)    All other components within normal limits  PREGNANCY, URINE  POC URINE PREG, ED  TROPONIN I (HIGH SENSITIVITY)  TROPONIN I (HIGH SENSITIVITY)   ____________________________________________  EKG Was reviewed by doctor on major ED Normal sinus rhythm with ventricular rate with sinus arrhythmia. Ventricular rate  69.  ____________________________________________  RADIOLOGY   Official radiology report(s): DG Chest 2 View  Result Date: 07/05/2020 CLINICAL DATA:  Chest pain for 2 weeks.  Shortness of breath. EXAM: CHEST - 2 VIEW COMPARISON:  None. FINDINGS: The heart size and mediastinal contours are within normal limits. Both lungs are clear. The visualized skeletal structures are unremarkable. IMPRESSION: No active cardiopulmonary disease. Electronically Signed   By: 09/04/2020 M.D.   On: 07/05/2020 22:01    ____________________________________________   PROCEDURES  Procedure(s) performed (including Critical Care):  Procedures   ____________________________________________   INITIAL IMPRESSION / ASSESSMENT AND PLAN / ED COURSE  As part of my medical decision making, I reviewed the following data within the electronic MEDICAL RECORD NUMBER Notes from prior ED visits and [place] Controlled Substance Database  24 year old female presents to the ED with complaint of shortness of breath and anxiety.  Patient has a history of chest pain for 2 weeks.  She denies any GI or respiratory symptoms.  She denies any exposure to Covid or symptoms of such.  Patient was reassured that lab work was within normal limits with  the exception of a minuscule decrease in her potassium.  She was given a list of foods rich in potassium to include in her diet.  Patient was reassured with EKG, troponin and chest x-ray unremarkable.  Patient will follow up with her PCP if any continued problems.  She also is aware that she can return to the emergency department if any worsening of her symptoms. ____________________________________________   FINAL CLINICAL IMPRESSION(S) / ED DIAGNOSES  Final diagnoses:  Nonspecific chest pain     ED Discharge Orders    None      *Please note:  Madison Dixon was evaluated in Emergency Department on 07/06/2020 for the symptoms described in the history of present illness. She was  evaluated in the context of the global COVID-19 pandemic, which necessitated consideration that the patient might be at risk for infection with the SARS-CoV-2 virus that causes COVID-19. Institutional protocols and algorithms that pertain to the evaluation of patients at risk for COVID-19 are in a state of rapid change based on information released by regulatory bodies including the CDC and federal and state organizations. These policies and algorithms were followed during the patient's care in the ED.  Some ED evaluations and interventions may be delayed as a result of limited staffing during and the pandemic.*   Note:  This document was prepared using Dragon voice recognition software and may include unintentional dictation errors.    Tommi Rumps, PA-C 07/06/20 1058    Dionne Bucy, MD 07/06/20 1120

## 2020-07-06 NOTE — ED Notes (Signed)
Pt called from main ED waiting room for vitals, no answer. 

## 2020-07-08 LAB — POCT PREGNANCY, URINE: Preg Test, Ur: NEGATIVE

## 2020-07-18 DIAGNOSIS — F900 Attention-deficit hyperactivity disorder, predominantly inattentive type: Secondary | ICD-10-CM | POA: Diagnosis not present

## 2020-07-24 DIAGNOSIS — F331 Major depressive disorder, recurrent, moderate: Secondary | ICD-10-CM | POA: Diagnosis not present

## 2020-07-24 DIAGNOSIS — F900 Attention-deficit hyperactivity disorder, predominantly inattentive type: Secondary | ICD-10-CM | POA: Diagnosis not present

## 2020-09-01 ENCOUNTER — Telehealth: Payer: Self-pay

## 2020-09-01 DIAGNOSIS — Z3201 Encounter for pregnancy test, result positive: Secondary | ICD-10-CM

## 2020-09-01 NOTE — Telephone Encounter (Signed)
Pt aware, transferred to [facility] to schedule lab appt. 

## 2020-09-01 NOTE — Telephone Encounter (Signed)
Pt called triage reporting she had a faint line on her pregnancy test and the front office told her to have you put in labs for HCG levels. Can you order this or should she just schedule a nob

## 2020-09-01 NOTE — Telephone Encounter (Signed)
Order placed. Pls notify pt.

## 2020-09-02 ENCOUNTER — Other Ambulatory Visit: Payer: Self-pay

## 2020-09-02 ENCOUNTER — Other Ambulatory Visit: Payer: Medicaid Other

## 2020-09-02 DIAGNOSIS — Z3201 Encounter for pregnancy test, result positive: Secondary | ICD-10-CM | POA: Diagnosis not present

## 2020-09-03 LAB — BETA HCG QUANT (REF LAB): hCG Quant: <1 m[IU]/mL

## 2020-09-17 ENCOUNTER — Telehealth: Payer: Medicaid Other | Admitting: Nurse Practitioner

## 2020-09-17 DIAGNOSIS — J0100 Acute maxillary sinusitis, unspecified: Secondary | ICD-10-CM

## 2020-09-17 MED ORDER — AMOXICILLIN-POT CLAVULANATE 875-125 MG PO TABS: 1.0000 | ORAL_TABLET | Freq: Two times a day (BID) | ORAL | 0 refills | Status: DC

## 2020-09-17 NOTE — Progress Notes (Signed)

## 2020-10-14 ENCOUNTER — Encounter: Payer: Self-pay | Admitting: Obstetrics and Gynecology

## 2020-10-14 ENCOUNTER — Other Ambulatory Visit: Payer: Self-pay

## 2020-10-14 ENCOUNTER — Ambulatory Visit (INDEPENDENT_AMBULATORY_CARE_PROVIDER_SITE_OTHER): Payer: Medicaid Other | Admitting: Obstetrics and Gynecology

## 2020-10-14 VITALS — BP 100/70 | Ht 62.0 in | Wt 168.0 lb

## 2020-10-14 DIAGNOSIS — Z124 Encounter for screening for malignant neoplasm of cervix: Secondary | ICD-10-CM | POA: Diagnosis not present

## 2020-10-14 DIAGNOSIS — N930 Postcoital and contact bleeding: Secondary | ICD-10-CM

## 2020-10-14 DIAGNOSIS — N926 Irregular menstruation, unspecified: Secondary | ICD-10-CM | POA: Diagnosis not present

## 2020-10-14 DIAGNOSIS — N898 Other specified noninflammatory disorders of vagina: Secondary | ICD-10-CM | POA: Diagnosis not present

## 2020-10-14 DIAGNOSIS — Z113 Encounter for screening for infections with a predominantly sexual mode of transmission: Secondary | ICD-10-CM

## 2020-10-14 LAB — POCT URINE PREGNANCY: Preg Test, Ur: NEGATIVE

## 2020-10-14 LAB — POCT WET PREP WITH KOH
Clue Cells Wet Prep HPF POC: NEGATIVE
KOH Prep POC: NEGATIVE
Trichomonas, UA: NEGATIVE
Yeast Wet Prep HPF POC: NEGATIVE

## 2020-10-14 NOTE — Progress Notes (Signed)
Margaretann Loveless, PA-C   Chief Complaint  Patient presents with  . Vaginal Bleeding    And cramping after intercourse x 2 months, late cycle this month, neg/faint UPT at home   . [employer] Employee    HPI:      Ms. Kamirah Shugrue Day is a 24 y.o. G1P1001 whose LMP was Patient's last menstrual period was 09/03/2020 (exact date)., presents today for postcoital bleeding every time, with occas cramping, for past 2 months. Notices small amt red blood/pink d/c with wiping only, then resolves. No new sex partners. Trying to conceive for past few months. Has also had increased mucousy d/c recently, no fishy odor, no irritation. Neg STD testing 8/20.  Menses are monthly, lasting 6 days, no BTB, min dysmen. Hx of endometriosis with tx. Sx resolved. Late for menses this cycle, now with increased breast tenderness, bloating and cramping. Neg serum beta 09/02/20, faint positive UPT at home. No GI or urin sx, no fevers. Has had LBP.  IUD removed 4/21.  Past Medical History:  Diagnosis Date  . Anxiety   . Asthma    sports induced  . Bipolar disorder ([facility])   . Chronic pelvic pain in female 09/09/2016  . Depression   . Endometriosis determined by laparoscopy 09/09/2016  . Ovarian cyst   . Pilonidal cyst   . PONV (postoperative nausea and vomiting)     Past Surgical History:  Procedure Laterality Date  . COLONOSCOPY WITH ESOPHAGOGASTRODUODENOSCOPY (EGD)    . LAPAROSCOPY N/A 09/09/2016   Procedure: LAPAROSCOPY DIAGNOSTIC, FULGERATION OF ENDOMETRIOSIS;  Surgeon: Conard Novak, MD;  Location: [facility] ORS;  Service: Gynecology;  Laterality: N/A;  . PILONIDAL CYST EXCISION N/A 04/10/2015   Procedure: CYST EXCISION PILONIDAL EXTENSIVE;  Surgeon: Kieth Brightly, MD;  Location: [facility] ORS;  Service: General;  Laterality: N/A;    Family History  Problem Relation Age of Onset  . Hyperlipidemia Father   . GER disease Father     Social History   Socioeconomic History  . Marital status:  Married    Spouse name: Brett Canales  . Number of children: Not on file  . Years of education: Not on file  . Highest education level: Not on file  Occupational History  . Not on file  Tobacco Use  . Smoking status: Never Smoker  . Smokeless tobacco: Never Used  Vaping Use  . Vaping Use: Never used  Substance and Sexual Activity  . Alcohol use: Not Currently    Alcohol/week: 0.0 standard drinks    Comment: drinks on friday nights  . Drug use: Not Currently    Types: Marijuana  . Sexual activity: Yes    Birth control/protection: None  Other Topics Concern  . Not on file  Social History Narrative  . Not on file   Social Determinants of Health   Financial Resource Strain: Not on file  Food Insecurity: Not on file  Transportation Needs: Not on file  Physical Activity: Not on file  Stress: Not on file  Social Connections: Not on file  Intimate Partner Violence: Not on file    Outpatient Medications Prior to Visit  Medication Sig Dispense Refill  . MULTIPLE VITAMINS PO Take by mouth.    Marland Kitchen amoxicillin-clavulanate (AUGMENTIN) 875-125 MG tablet Take 1 tablet by mouth 2 (two) times daily. 14 tablet 0  . levonorgestrel (MIRENA) 20 MCG/24HR IUD 1 each by Intrauterine route once.    Marland Kitchen ofloxacin (OCUFLOX) 0.3 % ophthalmic solution 10 drops into affected ear  once daily for 7 days. 1.4 mL 0   No facility-administered medications prior to visit.      ROS:  Review of Systems  Constitutional: Negative for fever, malaise/fatigue and weight loss.  Gastrointestinal: Negative for blood in stool, constipation, diarrhea, nausea and vomiting.  Genitourinary: Positive for menstrual problem, vaginal bleeding and vaginal discharge. Negative for dyspareunia, dysuria, flank pain, frequency, hematuria, urgency and vaginal pain.  Musculoskeletal: Negative for back pain.  Skin: Negative for itching and rash.    OBJECTIVE:   Vitals:  BP 100/70   Ht 5\' 2"  (1.575 m)   Wt 168 lb (76.2 kg)   LMP  09/03/2020 (Exact Date)   Breastfeeding No   BMI 30.73 kg/m   Physical Exam Vitals reviewed.  Constitutional:      Appearance: She is well-developed.  Pulmonary:     Effort: Pulmonary effort is normal.  Genitourinary:    General: Normal vulva.     Pubic Area: No rash.      Labia:        Right: No rash, tenderness or lesion.        Left: No rash, tenderness or lesion.      Vagina: Vaginal discharge present. No erythema or tenderness.     Cervix: No friability.     Uterus: Normal. Tender. Not enlarged.      Adnexa:        Right: Tenderness present. No mass.         Left: Tenderness present. No mass.    Musculoskeletal:        General: Normal range of motion.     Cervical back: Normal range of motion.  Skin:    General: Skin is warm and dry.  Neurological:     General: No focal deficit present.     Mental Status: She is alert and oriented to person, place, and time.  Psychiatric:        Mood and Affect: Mood normal.        Behavior: Behavior normal.        Thought Content: Thought content normal.        Judgment: Judgment normal.     Results: Results for orders placed or performed in visit on 10/14/20 (from the past 24 hour(s))  POCT urine pregnancy     Status: Normal   Collection Time: 10/14/20 10:09 AM  Result Value Ref Range   Preg Test, Ur Negative Negative  POCT Wet Prep with KOH     Status: Normal   Collection Time: 10/14/20 10:33 AM  Result Value Ref Range   Trichomonas, UA Negative    Clue Cells Wet Prep HPF POC neg    Epithelial Wet Prep HPF POC     Yeast Wet Prep HPF POC neg    Bacteria Wet Prep HPF POC     RBC Wet Prep HPF POC     WBC Wet Prep HPF POC     KOH Prep POC Negative Negative     Assessment/Plan: Postcoital bleeding--neg exam, cx not friable. Rule out STDs. If neg, will treat with Rx doxy. Will f/u with results.   Cervical cancer screening - Plan: IGP, rfx Aptima HPV ASCU  Screening for STD (sexually transmitted disease) - Plan:  Chlamydia/Gonococcus/Trichomonas, NAA,   Vaginal discharge - Plan: POCT Wet Prep with KOH; neg wet prep, rule out STDs. If neg, reassurance. Could be due to late cycle.  Late menses - Plan: POCT urine pregnancy; neg UPT. Reassurance. See if menses resumes  1/22. Having period sx/cramping and will feel better after menses.   Return if symptoms worsen or fail to improve.  Yolanda Huffstetler B. Fareeha Evon, PA-C 10/14/2020 10:35 AM

## 2020-10-14 NOTE — Patient Instructions (Signed)
I value your feedback and entrusting us with your care. If you get a [facility] patient survey, I would appreciate you taking the time to let us know about your experience today. Thank you!  As of October 04, 2019, your lab results will be released to your MyChart immediately, before I even have a chance to see them. Please give me time to review them and contact you if there are any abnormalities. Thank you for your patience.  

## 2020-10-16 LAB — IGP, RFX APTIMA HPV ASCU

## 2020-10-17 LAB — CHLAMYDIA/GONOCOCCUS/TRICHOMONAS, NAA
Chlamydia by NAA: NEGATIVE
Gonococcus by NAA: NEGATIVE
Trich vag by NAA: NEGATIVE

## 2020-10-17 MED ORDER — DOXYCYCLINE HYCLATE 100 MG PO CAPS: 100.0000 mg | ORAL_CAPSULE | Freq: Two times a day (BID) | ORAL | 0 refills | Status: DC

## 2020-10-17 NOTE — Addendum Note (Signed)
Addended by: Althea Grimmer B on: 10/17/2020 03:01 PM   Modules accepted: Orders

## 2020-10-30 ENCOUNTER — Encounter: Payer: Self-pay | Admitting: Obstetrics and Gynecology

## 2020-11-05 ENCOUNTER — Other Ambulatory Visit: Payer: Self-pay

## 2020-11-05 ENCOUNTER — Ambulatory Visit (INDEPENDENT_AMBULATORY_CARE_PROVIDER_SITE_OTHER): Payer: Medicaid Other | Admitting: Advanced Practice Midwife

## 2020-11-05 ENCOUNTER — Encounter: Payer: Self-pay | Admitting: Advanced Practice Midwife

## 2020-11-05 ENCOUNTER — Encounter: Payer: Medicaid Other | Admitting: Obstetrics and Gynecology

## 2020-11-05 VITALS — BP 121/70 | HR 76 | Wt 168.5 lb

## 2020-11-05 DIAGNOSIS — N926 Irregular menstruation, unspecified: Secondary | ICD-10-CM

## 2020-11-05 DIAGNOSIS — O99211 Obesity complicating pregnancy, first trimester: Secondary | ICD-10-CM

## 2020-11-05 DIAGNOSIS — Z3481 Encounter for supervision of other normal pregnancy, first trimester: Secondary | ICD-10-CM

## 2020-11-05 DIAGNOSIS — Z3490 Encounter for supervision of normal pregnancy, unspecified, unspecified trimester: Secondary | ICD-10-CM | POA: Insufficient documentation

## 2020-11-05 DIAGNOSIS — Z113 Encounter for screening for infections with a predominantly sexual mode of transmission: Secondary | ICD-10-CM

## 2020-11-05 LAB — POCT URINE PREGNANCY: Preg Test, Ur: POSITIVE — AB

## 2020-11-05 NOTE — Patient Instructions (Signed)

## 2020-11-06 ENCOUNTER — Encounter: Payer: Self-pay | Admitting: Advanced Practice Midwife

## 2020-11-06 NOTE — Progress Notes (Signed)
New Obstetric Patient H&P    Chief Complaint: "Desires prenatal care"   History of Present Illness: Patient is a 25 y.o. G2P1001 Not Hispanic or Latino female, presents with amenorrhea and positive home pregnancy test. Patient's last menstrual period was 09/03/2020 (exact date). and based on her  LMP, her EDD is Estimated Date of Delivery: 06/10/21 and her EGA is [redacted]w[redacted]d. Cycles are 6 days, regular, and occur approximately every : 28 days. Her last pap smear was 1 month ago and was no abnormalities.    She had a urine pregnancy test which was positive 1 week(s)  ago. Her last menstrual period was normal and lasted for  6 day(s). Since her LMP she claims she has experienced breast tenderness, fatigue, nausea. She denies vaginal bleeding. Her past medical history is noncontributory. Her prior pregnancies are notable for G1 2020 FT SVD 6#11oz female  Since her LMP, she admits to the use of tobacco products  no She claims she has gained a few pounds since the start of her pregnancy.  There are cats in the home in the home  no  She admits close contact with children on a regular basis  yes  She has had chicken pox in the past no She has had Tuberculosis exposures, symptoms, or previously tested positive for TB   no Current or past history of domestic violence. no  Genetic Screening/Teratology Counseling: (Includes patient, baby's father, or anyone in either family with:)   1. Patient's age >/= 92 at [facility]  no 2. Thalassemia ([place], [place], Mediterranean, or Asian background): MCV<80  no 3. Neural tube defect (meningomyelocele, spina bifida, anencephaly)  no 4. Congenital heart defect  no  5. Down syndrome  no 6. Tay-Sachs (Jewish, [place]  no 7. Canavan's Disease  no 8. Sickle cell disease or trait (African)  no  9. Hemophilia or other blood disorders  no  10. Muscular dystrophy  no  11. Cystic fibrosis  no  12. Huntington's Chorea  no  13. Mental retardation/autism  no 14. Other  inherited genetic or chromosomal disorder  no 15. Maternal metabolic disorder (DM, PKU, etc)  no 16. Patient or FOB with a child with a birth defect not listed above no  16a. Patient or FOB with a birth defect themselves no 17. Recurrent pregnancy loss, or stillbirth  no  18. Any medications since LMP other than prenatal vitamins (include vitamins, supplements, OTC meds, drugs, alcohol)  no 19. Any other genetic/environmental exposure to discuss  no  Infection History:   1. Lives with someone with TB or TB exposed  no  2. Patient or partner has history of genital herpes  no 3. Rash or viral illness since LMP  no 4. History of STI (GC, CT, HPV, syphilis, HIV)  no 5. History of recent travel :  no  Other pertinent information:  no     Review of Systems:10 point review of systems negative unless otherwise noted in HPI  Past Medical History:  Patient Active Problem List   Diagnosis Date Noted  . Supervision of normal pregnancy 11/05/2020    Clinic [facility] Prenatal Labs  Dating  Blood type:     Genetic Screen 1 Screen:    AFP:     Quad:     NIPS: Antibody:   Anatomic [place]  Rubella:    Varicella: @VZVIGG @  GTT Early:                Third trimester:  RPR:  Rhogam  HBsAg:     Vaccines TDAP:                       Flu Shot: Covid: HIV:     Baby Food Breast                       GBS:   GC/CT:  Contraception  Pap: 10/14/20 negative  CBB     CS/VBAC NA   Support Person Brett Canales       . Inverted nipple 02/07/2020  . Pregnancy 07/20/2019  . Uterine contractions during pregnancy 07/20/2019  . Postpartum care following vaginal delivery 07/20/2019  . Indication for care in labor and delivery, antepartum 07/11/2019  . Premature uterine contractions 06/19/2019  . Abdominal pain during pregnancy in third trimester 06/15/2019  . Supervision of normal first pregnancy 12/19/2018    [facility]inic [facility] Prenatal Labs  Dating 7wk2d ultrasound Blood type: A/Positive/-- (02/25 1218)   Genetic  Screen 1 Screen:    AFP:     Quad:     NIPS: diploid XX Antibody:Negative (02/25 1218)  Anatomic [place] Normal, anterior placenta Rubella: 2.70 (02/25 1218) Varicella:Nonimmune  GTT Early:               Third trimester: 126 RPR: Non Reactive (02/25 1218)   Rhogam  HBsAg: Negative (02/25 1218)   TDaP vaccine      Declines                 Flu Shot: HIV: Non Reactive (02/25 1218)   Baby Food                                GBS: negative  Contraception  Pap:NIL  CBB     CS/VBAC    Support Person         . Chronic pelvic pain in female 09/09/2016  . Endometriosis determined by laparoscopy 09/09/2016  . Allergic rhinitis 05/10/2008    Past Surgical History:  Past Surgical History:  Procedure Laterality Date  . COLONOSCOPY WITH ESOPHAGOGASTRODUODENOSCOPY (EGD)    . LAPAROSCOPY N/A 09/09/2016   Procedure: LAPAROSCOPY DIAGNOSTIC, FULGERATION OF ENDOMETRIOSIS;  Surgeon: Conard Novak, MD;  Location: [facility] ORS;  Service: Gynecology;  Laterality: N/A;  . PILONIDAL CYST EXCISION N/A 04/10/2015   Procedure: CYST EXCISION PILONIDAL EXTENSIVE;  Surgeon: Kieth Brightly, MD;  Location: [facility] ORS;  Service: General;  Laterality: N/A;    Gynecologic History: Patient's last menstrual period was 09/03/2020 (exact date).  Obstetric History: G2P1001  Family History:  Family History  Problem Relation Age of Onset  . Hyperlipidemia Father   . GER disease Father     Social History:  Social History   Socioeconomic History  . Marital status: Single    Spouse name: Brett Canales  . Number of children: Not on file  . Years of education: Not on file  . Highest education level: Not on file  Occupational History  . Not on file  Tobacco Use  . Smoking status: Never Smoker  . Smokeless tobacco: Never Used  Vaping Use  . Vaping Use: Never used  Substance and Sexual Activity  . Alcohol use: Not Currently    Alcohol/week: 0.0 standard drinks    Comment: drinks on friday nights  . Drug use: Not  Currently    Types: Marijuana  . Sexual activity: Yes    Birth control/protection: None  Other Topics Concern  . Not on file  Social History Narrative   ** Merged History Encounter **       Social Determinants of Health   Financial Resource Strain: Not on file  Food Insecurity: Not on file  Transportation Needs: Not on file  Physical Activity: Not on file  Stress: Not on file  Social Connections: Not on file  Intimate Partner Violence: Not on file    Allergies:  Allergies  Allergen Reactions  . Aripiprazole Nausea And Vomiting  . Vicodin [Hydrocodone-Acetaminophen] Nausea And Vomiting    Medications: Prior to Admission medications   Medication Sig Start Date End Date Taking? Authorizing Provider  Prenatal Vit-Fe Fumarate-FA (MULTIVITAMIN-PRENATAL) 27-0.8 MG TABS tablet Take 1 tablet by mouth daily at 12 noon.   Yes [provider]  doxycycline (VIBRAMYCIN) 100 MG capsule Take 1 capsule (100 mg total) by mouth 2 (two) times daily. Patient not taking: Reported on 11/05/2020 10/17/20   Copland, Ilona SorrelAlicia B, PA-C  MULTIPLE VITAMINS PO Take by mouth.    [provider]    Physical Exam Vitals: Blood pressure 121/70, pulse 76, weight 168 lb 8 oz (76.4 kg), last menstrual period 09/03/2020  General: NAD HEENT: normocephalic, anicteric Thyroid: no enlargement, no palpable nodules Pulmonary: No increased work of breathing, CTAB Cardiovascular: RRR, distal pulses 2+ Abdomen: NABS, soft, non-tender, non-distended.  Umbilicus without lesions.  No hepatomegaly, splenomegaly or masses palpable. No evidence of hernia  Genitourinary: deferred for recent aptima labs done/PAP interval Extremities: no edema, erythema, or tenderness Neurologic: Grossly intact Psychiatric: mood appropriate, affect full   The following were addressed during this visit:  Breastfeeding Education - Early initiation of breastfeeding    Comments: Keeps milk supply adequate, helps contract  uterus and slow bleeding, and early milk is the perfect first food and is easy to digest.   - The importance of exclusive breastfeeding    Comments: Provides antibodies, Lower risk of breast and ovarian cancers, and type-2 diabetes,Helps your body recover, Reduced chance of SIDS.   - Risks of giving your baby anything other than breast milk if you are breastfeeding    Comments: Make the baby less content with breastfeeds, may make my baby more susceptible to illness, and may reduce my milk supply.   - The importance of early skin-to-skin contact    Comments: Keeps baby warm and secure, helps keep baby's blood sugar up and breathing steady, easier to bond and breastfeed, and helps calm baby.  - Rooming-in on a 24-hour basis    Comments: Easier to learn baby's feeding cues, easier to bond and get to know each other, and encourages milk production.   - Feeding on demand or baby-led feeding    Comments: Helps prevent breastfeeding complications, helps bring in good milk supply, prevents under or overfeeding, and helps baby feel content and satisfied   - Frequent feeding to help assure optimal milk production    Comments: Making a full supply of milk requires frequent removal of milk from breasts, infant will eat 8-12 times in 24 hours, if separated from infant use breast massage, hand expression and/ or pumping to remove milk from breasts.   - Effective positioning and attachment    Comments: Helps my baby to get enough breast milk, helps to produce an adequate milk supply, and helps prevent nipple pain and damage   - Exclusive breastfeeding for the first 6 months    Comments: Builds a healthy milk supply and keeps it up, protects baby  from sickness and disease, and breastmilk has everything your baby needs for the first 6 months.  - Individualized Education    Comments: Contraindications to breastfeeding and other special medical conditions Patient has experience with breastfeeding- 2  months with first baby   Assessment: 23 y.o. G2P1001 at [redacted]w[redacted]d presenting to initiate prenatal care  Plan: 1) Avoid alcoholic beverages. 2) Patient encouraged not to smoke.  3) Discontinue the use of all non-medicinal drugs and chemicals.  4) Take prenatal vitamins daily.  5) Nutrition, food safety (fish, cheese advisories, and high nitrite foods) and exercise discussed. 6) Hospital and practice style discussed with cross coverage system.  7) Genetic Screening, such as with 1st Trimester Screening, cell free fetal DNA, AFP testing, and Ultrasound, as well as with amniocentesis and CVS as appropriate, is discussed with patient. At the conclusion of today's visit patient requested genetic testing 8) Patient is asked about travel to areas at risk for the Zika virus, and counseled to avoid travel and exposure to mosquitoes or sexual partners who may have themselves been exposed to the virus. Testing is discussed, and will be ordered as appropriate.  9) Urine culture done today 10) Return to clinic in 1 week for dating, early 1 hr gtt, NOB panel, rob   Tresea Mall, CNM [facility] 11/06/2020, 11:19 AM

## 2020-11-07 LAB — URINE CULTURE

## 2020-11-12 ENCOUNTER — Ambulatory Visit (INDEPENDENT_AMBULATORY_CARE_PROVIDER_SITE_OTHER): Payer: Medicaid Other | Admitting: Obstetrics and Gynecology

## 2020-11-12 ENCOUNTER — Other Ambulatory Visit: Payer: Self-pay | Admitting: Advanced Practice Midwife

## 2020-11-12 ENCOUNTER — Encounter: Payer: Self-pay | Admitting: Obstetrics and Gynecology

## 2020-11-12 ENCOUNTER — Other Ambulatory Visit: Payer: Medicaid Other

## 2020-11-12 ENCOUNTER — Other Ambulatory Visit: Payer: Self-pay

## 2020-11-12 ENCOUNTER — Ambulatory Visit (INDEPENDENT_AMBULATORY_CARE_PROVIDER_SITE_OTHER): Payer: Medicaid Other

## 2020-11-12 VITALS — BP 118/70 | Ht 62.0 in | Wt 170.0 lb

## 2020-11-12 DIAGNOSIS — Z3A10 10 weeks gestation of pregnancy: Secondary | ICD-10-CM

## 2020-11-12 DIAGNOSIS — Z3481 Encounter for supervision of other normal pregnancy, first trimester: Secondary | ICD-10-CM

## 2020-11-12 DIAGNOSIS — Z113 Encounter for screening for infections with a predominantly sexual mode of transmission: Secondary | ICD-10-CM

## 2020-11-12 DIAGNOSIS — O99211 Obesity complicating pregnancy, first trimester: Secondary | ICD-10-CM

## 2020-11-12 LAB — POCT URINALYSIS DIPSTICK OB
Glucose, UA: NEGATIVE
POC,PROTEIN,UA: NEGATIVE

## 2020-11-12 NOTE — Progress Notes (Signed)
Routine Prenatal Care Visit  Subjective  Madison Dixon is a 25 y.o. G2P1001 at [redacted]w[redacted]d being seen today for ongoing prenatal care.  She is currently monitored for the following issues for this low-risk pregnancy and has Allergic rhinitis; Chronic pelvic pain in female; Endometriosis determined by laparoscopy; Inverted nipple; and Supervision of normal pregnancy on their problem list.  ----------------------------------------------------------------------------------- Patient reports no complaints.    . Vag. Bleeding: None.   . Denies leaking of fluid.  ----------------------------------------------------------------------------------- The following portions of the patient's history were reviewed and updated as appropriate: allergies, current medications, past family history, past medical history, past social history, past surgical history and problem list. Problem list updated.    Objective  Blood pressure 118/70, height 5\' 2"  (1.575 m), weight 170 lb (77.1 kg), last menstrual period 09/03/2020, not currently breastfeeding. Pregravid weight 165 lb (74.8 kg) Total Weight Gain 5 lb (2.268 kg) Urinalysis:      Fetal Status:           Dating 13/07/2020: There is a single yolk sac and gestational sac seen within the uterus. No fetal pole or heartbeat is seen at this time. (U/S) EDD is not consistent with Clinically established EDD of 06/10/2021. A repeat ultrasound is needed in two to three weeks to check viability and dating.   General:  Alert, oriented and cooperative. Patient is in no acute distress.  Skin: Skin is warm and dry. No rash noted.   Cardiovascular: Normal heart rate noted  Respiratory: Normal respiratory effort, no problems with respiration noted  Abdomen: Soft, gravid, appropriate for gestational age. Pain/Pressure: Absent     Pelvic:  Cervical exam deferred        Extremities: Normal range of motion.     ental Status: Normal mood and affect. Normal behavior. Normal judgment and  thought content.     Assessment   25 y.o. G2P1001 at [redacted]w[redacted]d by  06/10/2021, by Last Menstrual Period presenting for routine prenatal visit  Plan   pregnancy 2 Problems (from 11/05/20 to present)    Problem Noted Resolved   Supervision of normal pregnancy 11/05/2020 by 01/03/2021, CNM No   Overview Addendum 11/12/2020 11:11 AM by 11/14/2020, CNM      Nursing Staff Provider  Office Location  [facility] Dating   LMP - needs f/u dating scan (no fetal pole)  Language  English Anatomy Zipporah Plants    Flu Vaccine    Genetic Screen  NIPS:     TDaP vaccine    Hgb A1C or  GTT Early :  Third trimester :   Rhogam     LAB RESULTS   Feeding Plan  breast Blood Type   A+  Contraception  Antibody    Circumcision  Rubella    Pediatrician   RPR     Support Person  HBsAg     Prenatal Classes  HIV      Varicella   BTL Consent  GBS  (For PCN allergy, check sensitivities)        VBAC Consent  n/a Pap  10/14/20 NILM    Hgb Electro      CF      SMA               Previous Version      -Patient denies cramping or vaginal bleeding today -Reviewed dating 10/16/20 - patient stated understanding and will return for follow-up in two weeks -NOB labs and 1h GTT collected today  First trimester precautions  including but not limited to vaginal bleeding, contractions, leaking of fluid and fetal movement were reviewed in detail with the patient.    Return in 2 weeks (on 11/26/2020) for ROB with MD and dating [place] (must be two weeks or more from today).  Zipporah Plants, CNM, MSN [facility], [facility] 11/12/2020, 11:12 AM

## 2020-11-13 LAB — RPR+RH+ABO+RUB AB+AB SCR+CB...
Antibody Screen: NEGATIVE
HIV Screen 4th Generation wRfx: NONREACTIVE
Hematocrit: 38.3 % (ref 34.0–46.6)
Hemoglobin: 12.7 g/dL (ref 11.1–15.9)
Hepatitis B Surface Ag: NEGATIVE
MCH: 30.8 pg (ref 26.6–33.0)
MCHC: 33.2 g/dL (ref 31.5–35.7)
MCV: 93 fL (ref 79–97)
Platelets: 324 10*3/uL (ref 150–450)
RBC: 4.12 x10E6/uL (ref 3.77–5.28)
RDW: 13.1 % (ref 11.7–15.4)
RPR Ser Ql: NONREACTIVE
Rh Factor: POSITIVE
Rubella Antibodies, IGG: 2.25 index (ref >0.99)
Varicella zoster IgG: <135 index — ABNORMAL LOW (ref >165)
WBC: 8.6 10*3/uL (ref 3.4–10.8)

## 2020-11-13 LAB — GLUCOSE, 1 HOUR GESTATIONAL: Gestational Diabetes Screen: 87 mg/dL (ref 65–139)

## 2020-11-30 ENCOUNTER — Other Ambulatory Visit: Payer: Self-pay

## 2020-11-30 ENCOUNTER — Emergency Department: Payer: Medicaid Other

## 2020-11-30 ENCOUNTER — Encounter: Payer: Self-pay | Admitting: Emergency Medicine

## 2020-11-30 ENCOUNTER — Emergency Department
Admission: EM | Admit: 2020-11-30 | Discharge: 2020-11-30 | Disposition: A | Discharge status: Home / Self Care | Payer: Medicaid Other | Attending: Emergency Medicine | Admitting: Emergency Medicine

## 2020-11-30 DIAGNOSIS — O26891 Other specified pregnancy related conditions, first trimester: Secondary | ICD-10-CM | POA: Insufficient documentation

## 2020-11-30 DIAGNOSIS — O4690 Antepartum hemorrhage, unspecified, unspecified trimester: Secondary | ICD-10-CM

## 2020-11-30 DIAGNOSIS — Z3A01 Less than 8 weeks gestation of pregnancy: Secondary | ICD-10-CM | POA: Diagnosis not present

## 2020-11-30 DIAGNOSIS — J45909 Unspecified asthma, uncomplicated: Secondary | ICD-10-CM | POA: Diagnosis not present

## 2020-11-30 DIAGNOSIS — O209 Hemorrhage in early pregnancy, unspecified: Secondary | ICD-10-CM | POA: Insufficient documentation

## 2020-11-30 DIAGNOSIS — Z3A08 8 weeks gestation of pregnancy: Secondary | ICD-10-CM | POA: Diagnosis not present

## 2020-11-30 DIAGNOSIS — R1032 Left lower quadrant pain: Secondary | ICD-10-CM | POA: Diagnosis not present

## 2020-11-30 DIAGNOSIS — Z3A12 12 weeks gestation of pregnancy: Secondary | ICD-10-CM | POA: Diagnosis not present

## 2020-11-30 LAB — URINALYSIS, COMPLETE (UACMP) WITH MICROSCOPIC
Bacteria, UA: NONE SEEN
Bilirubin Urine: NEGATIVE
Glucose, UA: NEGATIVE mg/dL
Ketones, ur: NEGATIVE mg/dL
Leukocytes,Ua: NEGATIVE
Nitrite: NEGATIVE
Protein, ur: NEGATIVE mg/dL
Specific Gravity, Urine: 1.016 (ref 1.005–1.030)
pH: 6 (ref 5.0–8.0)

## 2020-11-30 LAB — ABO/RH: ABO/RH(D): A POS

## 2020-11-30 LAB — CBC
HCT: 36.7 % (ref 36.0–46.0)
Hemoglobin: 12.3 g/dL (ref 12.0–15.0)
MCH: 30.3 pg (ref 26.0–34.0)
MCHC: 33.5 g/dL (ref 30.0–36.0)
MCV: 90.4 fL (ref 80.0–100.0)
Platelets: 299 10*3/uL (ref 150–400)
RBC: 4.06 MIL/uL (ref 3.87–5.11)
RDW: 12.4 % (ref 11.5–15.5)
WBC: 11.5 10*3/uL — ABNORMAL HIGH (ref 4.0–10.5)
nRBC: 0 % (ref 0.0–0.2)

## 2020-11-30 LAB — BASIC METABOLIC PANEL
Anion gap: 10 (ref 5–15)
BUN: 9 mg/dL (ref 6–20)
CO2: 21 mmol/L — ABNORMAL LOW (ref 22–32)
Calcium: 9.6 mg/dL (ref 8.9–10.3)
Chloride: 105 mmol/L (ref 98–111)
Creatinine, Ser: 0.51 mg/dL (ref 0.44–1.00)
GFR, Estimated: >60 mL/min (ref >60)
Glucose, Bld: 92 mg/dL (ref 70–99)
Potassium: 3.4 mmol/L — ABNORMAL LOW (ref 3.5–5.1)
Sodium: 136 mmol/L (ref 135–145)

## 2020-11-30 LAB — HCG, QUANTITATIVE, PREGNANCY: hCG, Beta Chain, Quant, S: 157417 m[IU]/mL — ABNORMAL HIGH (ref <5)

## 2020-11-30 NOTE — ED Notes (Signed)
Pt verbalized understanding of d/c instructions at this time. Pt denies further questions at this time.   Pt ambulatory to lobby, NAD noted, RR even and unlabored, steady gait noted.

## 2020-11-30 NOTE — ED Notes (Signed)
Pt to ED c/o vaginal bleeding and uterine cramping that started about 3 hours ago. Pt denies clots but states has seen "small pieces of tissue". Pt states is about [redacted] weeks pregnant per ultrasound that was performed at 5wk 1d pregnancy. Pt in NAD.  ED provider at bedside.

## 2020-11-30 NOTE — ED Triage Notes (Addendum)
Pt via POV from home. Pt c/o vaginal bleeding that started approx 1 hour ago, pt states it started off pink-tinged and now it has progressed to bright red blood, pt states there are small clots in it. Pt also c/o bilateral flank pain. Pt is [redacted] weeks pregnant. Denies any urinary symptoms. Pt is A&Ox4 and NAD.   Pt states that her pregnancy has been confirmed by a doctor and has a follow-up US on Tuesday.

## 2020-11-30 NOTE — Discharge Instructions (Signed)
Pelvic rest for 1 week Return if worsening Follow up with your regular gyn doctor

## 2020-11-30 NOTE — ED Provider Notes (Signed)
[facility] Emergency Department Provider Note  ____________________________________________   Event Date/Time   First MD Initiated Contact with Patient 11/30/20 1727     (approximate)  I have reviewed the triage vital signs and the nursing notes.   HISTORY  Chief Complaint Vaginal Bleeding and Flank Pain    HPI Madison Dixon is a 25 y.o. female presents emergency department complaining of vaginal bleeding and cramping.  Patient states it started 3 to 4 hours ago.  She states it is calmed down since arriving here.  Patient is approximately [redacted] weeks pregnant.  She had a confirmed pregnancy at her doctor's office.    Past Medical History:  Diagnosis Date  . Anxiety   . Asthma    sports induced  . Bipolar disorder (HCC)   . Chronic pelvic pain in female 09/09/2016  . Depression   . Endometriosis determined by laparoscopy 09/09/2016  . Ovarian cyst   . Pilonidal cyst   . PONV (postoperative nausea and vomiting)     Patient Active Problem List   Diagnosis Date Noted  . Supervision of normal pregnancy 11/05/2020  . Inverted nipple 02/07/2020  . Chronic pelvic pain in female 09/09/2016  . Endometriosis determined by laparoscopy 09/09/2016  . Allergic rhinitis 05/10/2008    Past Surgical History:  Procedure Laterality Date  . COLONOSCOPY WITH ESOPHAGOGASTRODUODENOSCOPY (EGD)    . LAPAROSCOPY N/A 09/09/2016   Procedure: LAPAROSCOPY DIAGNOSTIC, FULGERATION OF ENDOMETRIOSIS;  Surgeon: Conard Novak, MD;  Location: [facility] ORS;  Service: Gynecology;  Laterality: N/A;  . PILONIDAL CYST EXCISION N/A 04/10/2015   Procedure: CYST EXCISION PILONIDAL EXTENSIVE;  Surgeon: Kieth Brightly, MD;  Location: [facility] ORS;  Service: General;  Laterality: N/A;    Prior to Admission medications   Medication Sig Start Date End Date Taking? Authorizing Provider  doxycycline (VIBRAMYCIN) 100 MG capsule Take 1 capsule (100 mg total) by mouth 2 (two) times  daily. Patient not taking: Reported on 11/05/2020 10/17/20   Copland, Ilona Sorrel, PA-C  MULTIPLE VITAMINS PO Take by mouth.    [provider]  Prenatal Vit-Fe Fumarate-FA (MULTIVITAMIN-PRENATAL) 27-0.8 MG TABS tablet Take 1 tablet by mouth daily at 12 noon.    [provider]    Allergies Aripiprazole and Vicodin [hydrocodone-acetaminophen]  Family History  Problem Relation Age of Onset  . Hyperlipidemia Father   . GER disease Father     Social History Social History   Tobacco Use  . Smoking status: Never Smoker  . Smokeless tobacco: Never Used  Vaping Use  . Vaping Use: Never used  Substance Use Topics  . Alcohol use: Not Currently    Alcohol/week: 0.0 standard drinks    Comment: drinks on friday nights  . Drug use: Not Currently    Types: Marijuana    Review of Systems  Constitutional: No fever/chills Eyes: No visual changes. ENT: No sore throat. Respiratory: Denies cough Cardiovascular: Denies chest pain Gastrointestinal: Denies abdominal pain Genitourinary: Negative for dysuria.  Positive vaginal bleeding Musculoskeletal: Negative for back pain. Skin: Negative for rash. Psychiatric: no mood changes,     ____________________________________________   PHYSICAL EXAM:  VITAL SIGNS: ED Triage Vitals  Enc Vitals Group     BP 11/30/20 1609 111/69     Pulse Rate 11/30/20 1609 68     Resp 11/30/20 1609 20     Temp 11/30/20 1609 98 F (36.7 C)     Temp Source 11/30/20 1609 Oral     SpO2 11/30/20 1609  97 %     Weight 11/30/20 1610 165 lb (74.8 kg)     Height 11/30/20 1610 5\' 2"  (1.575 m)     Head Circumference --      Peak Flow --      Pain Score 11/30/20 1609 5     Pain Loc --      Pain Edu? --      Excl. in GC? --     Constitutional: Alert and oriented. Well appearing and in no acute distress. Eyes: Conjunctivae are normal.  Head: Atraumatic. Nose: No congestion/rhinnorhea. Mouth/Throat: Mucous membranes are moist.  Neck:  supple  no lymphadenopathy noted Cardiovascular: Normal rate, regular rhythm. Respiratory: Normal respiratory effort.  No retractions, Abd: Abdomen is slightly tender in the left lower quadrant, no rebound tenderness, bowel sounds normal all 4 quadrants GU: deferred Musculoskeletal: FROM all extremities, warm and well perfused Neurologic:  Normal speech and language.  Skin:  Skin is warm, dry and intact. No rash noted. Psychiatric: Mood and affect are normal. Speech and behavior are normal.  ____________________________________________   LABS (all labs ordered are listed, but only abnormal results are displayed)  Labs Reviewed  CBC - Abnormal; Notable for the following components:      Result Value   WBC 11.5 (*)    All other components within normal limits  BASIC METABOLIC PANEL - Abnormal; Notable for the following components:   Potassium 3.4 (*)    CO2 21 (*)    All other components within normal limits  URINALYSIS, COMPLETE (UACMP) WITH MICROSCOPIC - Abnormal; Notable for the following components:   Color, Urine YELLOW (*)    APPearance HAZY (*)    Hgb urine dipstick MODERATE (*)    All other components within normal limits  HCG, QUANTITATIVE, PREGNANCY - Abnormal; Notable for the following components:   hCG, Beta Chain, Quant, S 157,417 (*)    All other components within normal limits  ABO/RH   ____________________________________________   ____________________________________________  RADIOLOGY  Ultrasound OB less than 14 weeks  ____________________________________________   PROCEDURES  Procedure(s) performed: No  Procedures    ____________________________________________   INITIAL IMPRESSION / ASSESSMENT AND PLAN / ED COURSE  Pertinent labs & imaging results that were available during my care of the patient were reviewed by me and considered in my medical decision making (see chart for details).   Patient is 25 year old female presents emergency department  with vaginal bleeding.  Patient is [redacted] weeks pregnant.  See HPI physical exam shows patient to appear stable.  DDx: Vaginal bleeding in pregnancy, subchorionic hemorrhage, miscarriage  CBC is normal, basic metabolic panel is normal, urinalysis has moderate amount of hemoglobin, ABO/Rh is A+, beta hCG is 157,417  Ultrasound OB less than 14 weeks ordered.  Ultrasound was reviewed by me and confirmed by radiology.   Ultrasound OB less than 14 weeks shows a viable IUP.  I did explain the findings to the patient.  She is to follow-up with Dr. 25 at [facility] for her next appointment.  Call his office for earlier recheck if worsening.  She may also return emergency department if worsening.  Pelvic rest for 1 week.  She is discharged in stable condition.  Madison Dixon was evaluated in Emergency Department on 11/30/2020 for the symptoms described in the history of present illness. She was evaluated in the context of the global COVID-19 pandemic, which necessitated consideration that the patient might be at risk for infection with the SARS-CoV-2 virus that causes COVID-19.  Institutional protocols and algorithms that pertain to the evaluation of patients at risk for COVID-19 are in a state of rapid change based on information released by regulatory bodies including the CDC and federal and state organizations. These policies and algorithms were followed during the patient's care in the ED.    As part of my medical decision making, I reviewed the following data within the electronic MEDICAL RECORD NUMBER Nursing notes reviewed and incorporated, Labs reviewed , Old chart reviewed, Radiograph reviewed , Notes from prior ED visits and [place] Controlled Substance Database  ____________________________________________   FINAL CLINICAL IMPRESSION(S) / ED DIAGNOSES  Final diagnoses:  Vaginal bleeding in pregnancy      NEW MEDICATIONS STARTED DURING THIS VISIT:  New Prescriptions   No medications on file      Note:  This document was prepared using Dragon voice recognition software and may include unintentional dictation errors.    Faythe Ghee, PA-C 11/30/20 Remonia Richter, MD 11/30/20 [phone]

## 2020-12-01 ENCOUNTER — Telehealth: Payer: Self-pay

## 2020-12-01 NOTE — Telephone Encounter (Signed)
Pt calling; states she is 8wks preg; started cramping and bleeding; brown to pink to heavy red; went to ED; was told baby is okay; heart rate was okay; couldn't figure out what was wrong; today she is still having a lot of pain, bleeding and a little bit of tissue is coming out.  Has appt Thurs; should she come in sooner?  [phone]

## 2020-12-01 NOTE — Telephone Encounter (Signed)
Per SDJ pt can be seen tomorrow; if saturates pad q34min-1hr for 2hrs to go to ED.  Pt awrae. Tx'd to [place] for rescheduling.

## 2020-12-02 ENCOUNTER — Encounter: Payer: Medicaid Other | Admitting: Obstetrics

## 2020-12-02 NOTE — Telephone Encounter (Signed)
Called and left voicemail about cancelling appointment for today. Asked patient to call back to confirm scheduled appointment for 12/04/20

## 2020-12-04 ENCOUNTER — Other Ambulatory Visit: Payer: Self-pay

## 2020-12-04 ENCOUNTER — Ambulatory Visit (INDEPENDENT_AMBULATORY_CARE_PROVIDER_SITE_OTHER): Payer: Medicaid Other

## 2020-12-04 ENCOUNTER — Encounter: Payer: Self-pay | Admitting: Obstetrics & Gynecology

## 2020-12-04 ENCOUNTER — Other Ambulatory Visit: Payer: Self-pay | Admitting: Obstetrics and Gynecology

## 2020-12-04 ENCOUNTER — Ambulatory Visit (INDEPENDENT_AMBULATORY_CARE_PROVIDER_SITE_OTHER): Payer: Medicaid Other | Admitting: Obstetrics & Gynecology

## 2020-12-04 VITALS — BP 120/80 | Wt 166.0 lb

## 2020-12-04 DIAGNOSIS — Z3481 Encounter for supervision of other normal pregnancy, first trimester: Secondary | ICD-10-CM

## 2020-12-04 DIAGNOSIS — Z3A08 8 weeks gestation of pregnancy: Secondary | ICD-10-CM

## 2020-12-04 LAB — POCT URINALYSIS DIPSTICK OB
Glucose, UA: NEGATIVE
POC,PROTEIN,UA: NEGATIVE

## 2020-12-04 NOTE — Addendum Note (Signed)
Addended by: Cornelius Moras D on: 12/04/2020 10:55 AM   Modules accepted: Orders

## 2020-12-04 NOTE — Progress Notes (Signed)
  Subjective  No pain or bleeding Min nausea  Objective  BP 120/80   Wt 166 lb (75.3 kg)   LMP 09/03/2020 (Exact Date)   BMI 30.36 kg/m  General: NAD Pumonary: no increased work of breathing Abdomen: gravid, non-tender Extremities: no edema Psychiatric: mood appropriate, affect full  Review of ULTRASOUND.    I have personally reviewed images and report of recent ultrasound done at [facility].    Plan of management to be discussed with patient. EDC established today at 07/13/2021; good FHT  Assessment  24 y.o. G2P1001 at [redacted]w[redacted]d by  07/13/2021, by Ultrasound presenting for routine prenatal visit  Plan   Problem List Items Addressed This Visit      Other   Supervision of normal pregnancy    Other Visit Diagnoses    [redacted] weeks gestation of pregnancy    -  Primary      pregnancy 2 Problems (from 11/05/20 to present)    Problem Noted Resolved   Supervision of normal pregnancy 11/05/2020 by Tresea Mall, CNM No   Overview Addendum 12/04/2020 10:43 AM by Nadara Mustard, MD      Nursing Staff Provider  Office Location  [facility] Dating   LMP - needs f/u dating scan (no fetal pole), [place] 8 weeks w [facility] 07/13/21  Language  English Anatomy US    Flu Vaccine    Genetic Screen  NIPS:     TDaP vaccine    Hgb A1C or  GTT Early :  Third trimester :   Rhogam  n/a   LAB RESULTS   Feeding Plan  breast Blood Type --/--/A POS Performed at [facility], [address] [place], [place] [address]  [phone]) A+  Contraception  Antibody Negative (01/19 1025)  Circumcision  Rubella 2.25 (01/19 1025)  Pediatrician   RPR Non Reactive (01/19 1025)   Support Person  HBsAg Negative (01/19 1025)   Prenatal Classes  HIV Non Reactive (01/19 1025)    Varicella   BTL Consent  GBS  (For PCN allergy, check sensitivities)        VBAC Consent  n/a Pap  10/14/20 NILM    Hgb Electro      CF      SMA            Maternity 21 nv     Annamarie Major, MD, [facility], [facility] 12/04/2020  10:51 AM

## 2020-12-04 NOTE — Patient Instructions (Signed)
Genetic Testing During Pregnancy Why is genetic testing done? Genetic testing during pregnancy is also called prenatal genetic testing. This type of testing can determine if your baby is at risk of being born with a disorder caused by abnormal genes or chromosomes (genetic disorder). Chromosomes contain genes that control how your baby will develop in your womb. There are many different genetic disorders. Examples of genetic disorders that may be found through genetic testing include Down syndrome and cystic fibrosis. Gene changes (mutations) can be passed down through families. Genetic testing is offered to women during pregnancy. You can choose whether to have genetic testing. Having genetic testing allows you to:  Discuss your test results and options with your health care provider.  Prepare for a baby that may be born with a genetic disorder. Learning about the disorder ahead of time helps you be better prepared to manage it. Your health care providers can also be prepared in case your baby requires special care before or after birth.  Consider whether you want to continue with the pregnancy. In some cases, genetic testing may be done to learn about the traits a child will inherit. Types of genetic tests There are two basic types of genetic testing. Screening tests indicate whether your developing baby (fetus) is at higher risk for a genetic disorder. Diagnostic tests check actual fetal cells to diagnose a genetic disorder. Screening tests Screening tests will not harm your baby. They are recommended for all pregnant women. Types of screening tests include:  Carrier screening. This test involves checking genes from both parents by testing their blood or saliva. The test checks to find out if the parents carry a genetic mutation that may be passed to a baby. In most cases, both parents must carry the mutation for a baby to be at risk.  First trimester screening. This test combines a blood test  with sound wave imaging of your baby (fetal ultrasound). This screening test checks for a risk of Down syndrome or other defects caused by having extra chromosomes. The ultrasound also checks for defects of the heart, abdomen, or skeleton.  Second trimester screening also combines a blood test with a fetal ultrasound exam. This test checks for a risk of Down syndrome or other defects caused by having extra chromosomes. The ultrasound allows your health care provider to look for genetic defects of the face, brain, spine, heart, or limbs. Some women may choose to only have an ultrasound exam without a blood test.  Combined or sequential screening. This type of testing combines the results of first and second trimester screening. This type of testing may be more accurate than first or second trimester screening alone.  Cell-free DNA testing. This is a blood test that detects cells released by the placenta that get into the mother's blood. It can be used to check for a risk of Down syndrome, other extra chromosome syndromes, and disorders caused by abnormal numbers of sex chromosomes. This test can be done any time after 10 weeks of pregnancy.      Diagnostic tests Diagnostic tests carry slight risks of problems, including bleeding, infection, and loss of the pregnancy. These tests are done only if your baby is at risk for a genetic disorder. Your health care provider will discuss the risks and benefits of having diagnostic tests before performing these types of tests. Examples of diagnostic tests include:  Chorionic villus sampling (CVS). This involves a procedure to remove and test a sample of cells taken from the   placenta. The procedure may be done between 10 and 12 weeks of pregnancy.  Amniocentesis. This involves a procedure to remove and test a sample of fluid (amniotic fluid) and cells from the sac that surrounds the developing baby. The procedure may be done any time during the pregnancy, but it is  usually done between 15 and 20 weeks of pregnancy. What do the results mean? For a screening test:  If the results are negative, it often means that your child is not at higher risk. There is still a slight chance your child could have a genetic disorder.  If the results are positive, it does not mean your child will have a genetic disorder. It may mean that your child has a higher-than-normal risk for a genetic disorder. In that case, you should talk with your health care provider about whether you should have diagnostic genetic tests. For a diagnostic test:  If the result is negative, it is unlikely that your child will have a genetic disorder.  If the test is positive for a genetic disorder, it is likely that your child will have the disorder. The test may not tell how severe the disorder will be. Talk with your health care provider about your options. Talk with your health care provider about what your results mean. Questions to ask your health care provider Before talking to your health care provider about genetic testing, find out if there is a history of genetic disorders in your family. It may also help to know your family's ethnic origins. Then ask your health care provider the following questions:  Is my baby at risk for a genetic disorder?  What are the benefits of having genetic screening?  What tests are best for me and my baby?  What are the risks of each test?  If I get a positive result on a screening test, what is the next step?  Should I meet with a genetic counselor?  Should my partner or other members of my family be tested?  How much do the tests cost? Will my insurance cover the testing? Summary  Genetic testing is done during pregnancy to find out whether your child is at risk for a genetic disorder.  Genetic testing is offered to women during pregnancy. You can choose whether to have genetic testing.  There are two basic types of genetic testing. Screening  tests indicate whether your developing baby (fetus) is at higher risk for a genetic disorder. Diagnostic tests check actual fetal cells to diagnose a genetic disorder.  If a diagnostic genetic test is positive, talk with your health care provider about your options. This information is not intended to replace advice given to you by your health care provider. Make sure you discuss any questions you have with your health care provider. Document Revised: 05/02/2020 Document Reviewed: 05/02/2020 Elsevier Patient Education  2021 Elsevier Inc.  

## 2020-12-05 ENCOUNTER — Encounter: Payer: Medicaid Other | Admitting: Obstetrics and Gynecology

## 2020-12-05 ENCOUNTER — Ambulatory Visit: Payer: Medicaid Other

## 2020-12-17 ENCOUNTER — Telehealth: Payer: Self-pay

## 2020-12-17 ENCOUNTER — Other Ambulatory Visit: Payer: Self-pay

## 2020-12-17 ENCOUNTER — Ambulatory Visit (INDEPENDENT_AMBULATORY_CARE_PROVIDER_SITE_OTHER): Payer: Medicaid Other | Admitting: Obstetrics and Gynecology

## 2020-12-17 ENCOUNTER — Encounter: Payer: Self-pay | Admitting: Obstetrics and Gynecology

## 2020-12-17 VITALS — BP 112/68 | Wt 163.0 lb

## 2020-12-17 DIAGNOSIS — Z1379 Encounter for other screening for genetic and chromosomal anomalies: Secondary | ICD-10-CM

## 2020-12-17 DIAGNOSIS — Z3A10 10 weeks gestation of pregnancy: Secondary | ICD-10-CM

## 2020-12-17 DIAGNOSIS — Z3481 Encounter for supervision of other normal pregnancy, first trimester: Secondary | ICD-10-CM | POA: Diagnosis not present

## 2020-12-17 NOTE — Progress Notes (Signed)
    Routine Prenatal Care Visit  Subjective  Madison Dixon is a 25 y.o. G2P1001 at [redacted]w[redacted]d being seen today for ongoing prenatal care.  She is currently monitored for the following issues for this low-risk pregnancy and has Allergic rhinitis; Chronic pelvic pain in female; Endometriosis determined by laparoscopy; Inverted nipple; and Supervision of normal pregnancy on their problem list.  ----------------------------------------------------------------------------------- Patient reports no complaints.  Reports she is currently managing nausea well.  . Vag. Bleeding: None.   . Denies leaking of fluid.  ----------------------------------------------------------------------------------- The following portions of the patient's history were reviewed and updated as appropriate: allergies, current medications, past family history, past medical history, past social history, past surgical history and problem list. Problem list updated.   Objective  Blood pressure 112/68, weight 163 lb (73.9 kg), last menstrual period 09/03/2020, not currently breastfeeding. Pregravid weight 165 lb (74.8 kg) Total Weight Gain -2 lb (-0.907 kg) Urinalysis:      Fetal Status: Fetal Heart Rate (bpm): present         General:  Alert, oriented and cooperative. Patient is in no acute distress.  Skin: Skin is warm and dry. No rash noted.   Cardiovascular: Normal heart rate noted  Respiratory: Normal respiratory effort, no problems with respiration noted  Abdomen: Soft, gravid, appropriate for gestational age. Pain/Pressure: Absent     Pelvic:  Cervical exam deferred        Extremities: Normal range of motion.     ental Status: Normal mood and affect. Normal behavior. Normal judgment and thought content.     Assessment   25 y.o. G2P1001 at [redacted]w[redacted]d by  07/13/2021, by Ultrasound presenting for routine prenatal visit  Plan   pregnancy 2 Problems (from 11/05/20 to present)    Problem Noted Resolved   Supervision of  normal pregnancy 11/05/2020 by Tresea Mall, CNM No   Overview Addendum 12/17/2020  3:00 PM by Zipporah Plants, CNM      Nursing Staff Provider  Office Location  [facility] Dating   8w [place]  Language  English Anatomy US    Flu Vaccine   declined Genetic Screen  NIPS:  Collected 12/17/20   TDaP vaccine    Hgb A1C or  GTT Early : 87 Third trimester :   Rhogam  n/a   LAB RESULTS   Feeding Plan  breast Blood Type --/--/A POS Performed at [facility], [address] [place], [place] [address]  [phone]) A+  Contraception  IUD-LNG Antibody Negative (01/19 1025)  Circumcision  Rubella 2.25 (01/19 1025)  Pediatrician   RPR Non Reactive (01/19 1025)   Support Person  Brett Canales HBsAg Negative (01/19 1025)   Prenatal Classes  HIV Non Reactive (01/19 1025)    Varicella  non-immune  BTL Consent  n/a GBS  (For PCN allergy, check sensitivities)        VBAC Consent  n/a Pap  10/14/20 NILM    Hgb Electro   n/a    CF       SMA                Previous Version       -NIPS today  First trimester precautions including but not limited to vaginal bleeding, contractions, leaking of fluid and fetal movement were reviewed in detail with the patient.    Return in about 4 weeks (around 01/14/2021) for ROB.  Zipporah Plants, CNM, MSN [facility], [facility] 12/17/2020, 3:00 PM

## 2020-12-17 NOTE — Telephone Encounter (Signed)
Spoke w/patient. Advised her apt tomorrow is with provider for ROB and labs. Patient would like to be seen today. Apt r/s to 2:30 today w/Kate.

## 2020-12-17 NOTE — Telephone Encounter (Signed)
Patient needs to r/s her 12/18/20 3:30pm lab apt. She is going out of town at noon. Cb#[id number]

## 2020-12-18 ENCOUNTER — Encounter: Payer: Medicaid Other | Admitting: Obstetrics and Gynecology

## 2020-12-22 LAB — MATERNIT 21 PLUS CORE, BLOOD
Fetal Fraction: 12
Result (T21): NEGATIVE
Trisomy 13 (Patau syndrome): NEGATIVE
Trisomy 18 (Edwards syndrome): NEGATIVE
Trisomy 21 (Down syndrome): NEGATIVE

## 2020-12-26 ENCOUNTER — Other Ambulatory Visit (HOSPITAL_COMMUNITY)
Admission: RE | Admit: 2020-12-26 | Discharge: 2020-12-26 | Disposition: A | Discharge status: Home / Self Care | Payer: Medicaid Other | Source: Ambulatory Visit | Attending: Obstetrics & Gynecology | Admitting: Obstetrics & Gynecology

## 2020-12-26 ENCOUNTER — Ambulatory Visit (INDEPENDENT_AMBULATORY_CARE_PROVIDER_SITE_OTHER): Payer: Medicaid Other | Admitting: Obstetrics & Gynecology

## 2020-12-26 ENCOUNTER — Encounter: Payer: Self-pay | Admitting: Obstetrics & Gynecology

## 2020-12-26 ENCOUNTER — Other Ambulatory Visit: Payer: Self-pay

## 2020-12-26 VITALS — BP 110/70 | Ht 62.0 in | Wt 168.0 lb

## 2020-12-26 DIAGNOSIS — N898 Other specified noninflammatory disorders of vagina: Secondary | ICD-10-CM

## 2020-12-26 DIAGNOSIS — R300 Dysuria: Secondary | ICD-10-CM | POA: Diagnosis not present

## 2020-12-26 LAB — POCT URINALYSIS DIPSTICK
Bilirubin, UA: NEGATIVE
Blood, UA: NEGATIVE
Glucose, UA: NEGATIVE
Ketones, UA: NEGATIVE
Nitrite, UA: NEGATIVE
Protein, UA: NEGATIVE
Spec Grav, UA: 1.01 (ref 1.010–1.025)
Urobilinogen, UA: 0.2 E.U./dL
pH, UA: 5 (ref 5.0–8.0)

## 2020-12-26 MED ORDER — NITROFURANTOIN MONOHYD MACRO 100 MG PO CAPS: 100.0000 mg | ORAL_CAPSULE | Freq: Two times a day (BID) | ORAL | 0 refills | Status: DC

## 2020-12-26 NOTE — Progress Notes (Signed)
HPI:      Madison Dixon is a 25 y.o. G2P1001 who LMP was Patient's last menstrual period was 09/03/2020 (exact date)., presents today for a problem visit.  She is also [redacted] weeks pregnant.  Urinary Tract Infection: Patient complains of abnormal smelling urine and burning in the vaginal area (with and without voids) . She has had symptoms for 1 day. Patient also complains of back pain.   She has some recent worsening in amount of vaginal discharge and has had some d/c throughout pregnancy.  No bleeding.  Patient denies fever and headache. Patient does not have a history of recurrent UTI.  Patient does not have a history of pyelonephritis.   PMHx: She  has a past medical history of Anxiety, Asthma, Bipolar disorder (HCC), Chronic pelvic pain in female (09/09/2016), Depression, Endometriosis determined by laparoscopy (09/09/2016), Ovarian cyst, Pilonidal cyst, and PONV (postoperative nausea and vomiting). Also,  has a past surgical history that includes Pilonidal cyst excision (N/A, 04/10/2015); Colonoscopy with esophagogastroduodenoscopy (egd); and laparoscopy (N/A, 09/09/2016)., family history includes GER disease in her father; Hyperlipidemia in her father.,  reports that she has never smoked. She has never used smokeless tobacco. She reports previous alcohol use. She reports previous drug use. Drug: Marijuana.  She has a current medication list which includes the following prescription(s): nitrofurantoin (macrocrystal-monohydrate) and multivitamin-prenatal. Also, is allergic to aripiprazole and vicodin [hydrocodone-acetaminophen].  Review of Systems  Constitutional: Negative for chills, fever and malaise/fatigue.  HENT: Negative for congestion, sinus pain and sore throat.   Eyes: Negative for blurred vision and pain.  Respiratory: Negative for cough and wheezing.   Cardiovascular: Negative for chest pain and leg swelling.  Gastrointestinal: Negative for abdominal pain, constipation, diarrhea,  heartburn, nausea and vomiting.  Genitourinary: Negative for dysuria, frequency, hematuria and urgency.  Musculoskeletal: Negative for back pain, joint pain, myalgias and neck pain.  Skin: Negative for itching and rash.  Neurological: Negative for dizziness, tremors and weakness.  Endo/Heme/Allergies: Does not bruise/bleed easily.  Psychiatric/Behavioral: Negative for depression. The patient is not nervous/anxious and does not have insomnia.     Objective: BP 110/70   Ht 5\' 2"  (1.575 m)   Wt 168 lb (76.2 kg)   LMP 09/03/2020 (Exact Date)   BMI 30.73 kg/m  Physical Exam Constitutional:      General: She is not in acute distress.    Appearance: She is well-developed.  Genitourinary:     Vagina and uterus normal.     No vaginal erythema or bleeding.      Right Adnexa: not tender and no mass present.    Left Adnexa: not tender and no mass present.    No cervical motion tenderness, discharge, polyp or nabothian cyst.     Uterus is mobile.     Uterus is not enlarged.     No uterine mass detected.    Uterus is midaxial.     Pelvic exam was performed with patient supine.  HENT:     Head: Normocephalic and atraumatic.     Nose: Nose normal.  Abdominal:     General: There is no distension.     Palpations: Abdomen is soft.     Tenderness: There is no abdominal tenderness.  Musculoskeletal:        General: Normal range of motion.  Neurological:     Mental Status: She is alert and oriented to person, place, and time.     Cranial Nerves: No cranial nerve deficit.  Skin:  General: Skin is warm and dry.  Psychiatric:        Attention and Perception: Attention normal.        Mood and Affect: Mood and affect normal.        Speech: Speech normal.        Behavior: Behavior normal.        Thought Content: Thought content normal.        Judgment: Judgment normal.    UA- Leuk+  ASSESSMENT/PLAN:   UTI, Possible BV  Problem List Items Addressed This Visit   None   Visit  Diagnoses    Dysuria    -  Primary   Relevant Orders   POCT urinalysis dipstick (Completed)   Vaginal discharge       Relevant Orders   Cervicovaginal ancillary only    Will treat UTI w Macrobid Will await vag testing for BV, STD and treat accordingly.  A total of 20 minutes were spent face-to-face with the patient as well as preparation, review, communication, and documentation during this encounter.   Annamarie Major, MD, [facility], [facility] 12/26/2020  3:20 PM

## 2020-12-26 NOTE — Addendum Note (Signed)
Addended by: Nadara Mustard on: 12/26/2020 04:29 PM   Modules accepted: Orders

## 2020-12-29 LAB — URINE CULTURE

## 2020-12-31 ENCOUNTER — Other Ambulatory Visit: Payer: Self-pay | Admitting: Obstetrics & Gynecology

## 2020-12-31 LAB — CERVICOVAGINAL ANCILLARY ONLY
Bacterial Vaginitis (gardnerella): POSITIVE — AB
Candida Glabrata: NEGATIVE
Candida Vaginitis: NEGATIVE
Chlamydia: NEGATIVE
Comment: NEGATIVE
Comment: NEGATIVE
Comment: NEGATIVE
Comment: NEGATIVE
Comment: NEGATIVE
Comment: NORMAL
Neisseria Gonorrhea: NEGATIVE
Trichomonas: NEGATIVE

## 2020-12-31 MED ORDER — METRONIDAZOLE 500 MG PO TABS: 500.0000 mg | ORAL_TABLET | Freq: Two times a day (BID) | ORAL | 0 refills | Status: DC

## 2021-01-08 ENCOUNTER — Ambulatory Visit (INDEPENDENT_AMBULATORY_CARE_PROVIDER_SITE_OTHER): Payer: Medicaid Other | Admitting: Obstetrics

## 2021-01-08 ENCOUNTER — Other Ambulatory Visit: Payer: Self-pay | Admitting: Obstetrics

## 2021-01-08 ENCOUNTER — Encounter: Payer: Self-pay | Admitting: Obstetrics and Gynecology

## 2021-01-08 ENCOUNTER — Other Ambulatory Visit: Payer: Self-pay

## 2021-01-08 ENCOUNTER — Ambulatory Visit
Admission: RE | Admit: 2021-01-08 | Discharge: 2021-01-08 | Disposition: A | Discharge status: Home / Self Care | Payer: Medicaid Other | Source: Ambulatory Visit | Attending: Obstetrics | Admitting: Obstetrics

## 2021-01-08 VITALS — BP 100/60 | Wt 164.0 lb

## 2021-01-08 DIAGNOSIS — Z3A13 13 weeks gestation of pregnancy: Secondary | ICD-10-CM

## 2021-01-08 DIAGNOSIS — Z3481 Encounter for supervision of other normal pregnancy, first trimester: Secondary | ICD-10-CM

## 2021-01-08 DIAGNOSIS — N898 Other specified noninflammatory disorders of vagina: Secondary | ICD-10-CM

## 2021-01-08 DIAGNOSIS — O99891 Other specified diseases and conditions complicating pregnancy: Secondary | ICD-10-CM

## 2021-01-08 DIAGNOSIS — Z3689 Encounter for other specified antenatal screening: Secondary | ICD-10-CM | POA: Diagnosis not present

## 2021-01-08 NOTE — Progress Notes (Signed)
Routine Prenatal Care Visit  Subjective  Madison Dixon is a 25 y.o. G2P1001 at [redacted]w[redacted]d being seen today for ongoing prenatal care.  She is currently monitored for the following issues for this low-risk pregnancy and has Allergic rhinitis; Chronic pelvic pain in female; Endometriosis determined by laparoscopy; Inverted nipple; and Supervision of normal pregnancy on their problem list.  ----------------------------------------------------------------------------------- Patient reports no leaking and vaginal irritation.  She has recently been treated for Bv and still feels like she has sxs. C/o malodor.  . Vag. Bleeding: None.   . Leaking Fluid denies.  ----------------------------------------------------------------------------------- The following portions of the patient's history were reviewed and updated as appropriate: allergies, current medications, past family history, past medical history, past social history, past surgical history and problem list. Problem list updated.  Objective  Blood pressure 100/60, weight 164 lb (74.4 kg), last menstrual period 09/03/2020, not currently breastfeeding. Pregravid weight 165 lb (74.8 kg) Total Weight Gain -1 lb (-0.454 kg) Urinalysis: Urine Protein    Urine Glucose    Fetal Status:         Unable to auscultate FHTs via hand held doppler or with the office ultrasound abdominally.  General:  Alert, oriented and cooperative. Patient is in no acute distress.  Skin: Skin is warm and dry. No rash noted.   Cardiovascular: Normal heart rate noted  Respiratory: Normal respiratory effort, no problems with respiration noted  Abdomen: Soft, gravid, appropriate for gestational age. Pain/Pressure: Absent     Pelvic:  Cervical exam performed      sterile spec exam to retrieve Aptima swab sample.cervix is closed.  Extremities: Normal range of motion.  Edema: None  Mental Status: Normal mood and affect. Normal behavior. Normal judgment and thought content.    Assessment   25 y.o. G2P1001 at [redacted]w[redacted]d by  07/13/2021, by Ultrasound presenting for routine prenatal visit  Plan   pregnancy 2 Problems (from 11/05/20 to present)    Problem Noted Resolved   Supervision of normal pregnancy 11/05/2020 by Tresea Mall, CNM No   Overview Addendum 12/24/2020  2:27 PM by Zipporah Plants, CNM      Nursing Staff Provider  Office Location  [facility] Dating   8w [place]  Language  English Anatomy US    Flu Vaccine   declined Genetic Screen  NIPS:  Neg x3, XX  TDaP vaccine    Hgb A1C or  GTT Early : 87 Third trimester :   Rhogam  n/a   LAB RESULTS   Feeding Plan  breast Blood Type --/--/A POS Performed at [facility], [address] [place], [place] [address]  [phone]) A+  Contraception  IUD-LNG Antibody Negative (01/19 1025)  Circumcision  n/a Rubella 2.25 (01/19 1025)  Pediatrician   RPR Non Reactive (01/19 1025)   Support Person  Brett Canales HBsAg Negative (01/19 1025)   Prenatal Classes  HIV Non Reactive (01/19 1025)    Varicella  non-immune  BTL Consent  n/a GBS  (For PCN allergy, check sensitivities)        VBAC Consent  n/a Pap  10/14/20 NILM    Hgb Electro   n/a    CF       SMA                Previous Version        With the office ultrasound attempt made to obtain FHTS abdominally- not successful. Dr. Bjorn Pippin also unable to visualize fetus and obtain FHTS. Patient now sent to [facility] for  STAT transabdominal ultrasound for FHTS. Dr. Bonney Aid, on call at the hospital is made aware of patient's outpatient ultrasound.  refer to After Visit Summary for other counseling recommendations.   Return in about 4 weeks (around 02/05/2021) for return OB.  Mirna Mires, CNM  01/08/2021 4:48 PM

## 2021-01-08 NOTE — Progress Notes (Signed)
US OB LESS THAN 14 WEEKS WITH OB TRANSVAGINAL  Result Date: 01/08/2021 CLINICAL DATA:  Thirteen week gestation, unable to hear fetal heart tones in office EXAM: OBSTETRIC <14 WK ULTRASOUND TECHNIQUE: Transabdominal ultrasound was performed for evaluation of the gestation as well as the maternal uterus and adnexal regions. COMPARISON:  12/04/2020 FINDINGS: Intrauterine gestational sac: Present, single, irregular Yolk sac:  Not identified Embryo:  Present Cardiac Activity: Not identified Heart Rate: N/A bpm CRL:   22 mm   8 w 5 d                  [place] EDC: 08/15/2021 Subchorionic hemorrhage:  None visualized. Maternal uterus/adnexae: Gestational sac is irregular in appearance and with diffuse low level internal echogenicity. No fetal cardiac activity identified on either M-mode tracing or on cine analysis. Maternal uterus otherwise unremarkable, anteverted. RIGHT ovary normal size and morphology, 3.2 x 1.8 x 2.5 cm. LEFT ovary normal size and morphology, 3.3 x 3.3 x 2.4 cm. No free pelvic fluid or adnexal masses. IMPRESSION: Single intrauterine gestation is identified within an irregular gestational sac that contains debris. No fetal cardiac activity identified. Findings meet definitive criteria for failed pregnancy. This follows SRU consensus guidelines: Diagnostic Criteria for Nonviable Pregnancy Early in the First Trimester. [facility] J Med [phone]. These results will be called to the ordering clinician or representative by the Radiologist Assistant, and communication documented in the PACS or [employer]. Electronically Signed   By: Ulyses Southward M.D.   On: 01/08/2021 17:20    Spoke with Tawnya Crook via phone.  Condolences were offered to the patient and her family.  I stressed that while emotionally difficult, that this did not occur because of an actions or inactions by the patient.  Somewhere between 10-20% of identified first trimester pregnancies will unfortunately end in miscarriage.  Given this  relatively high incidence rate, further diagnostic testing such as chromosome analysis is generally not clinically relevant nor recommended.  Although the chromosomal abnormalities have been implicated at rates as high as 70% in some studies, these are generally random and do not infer and increased risk of recurrence with subsequent pregnancies.  However, 3 or more consecutive first trimester losses are relatively uncommon, and these patient generally do benefit from additional work up to determine a potential modifiable etiology.   We briefly discussed management options including expectant management, medical management, and surgical management as well as their relative success rates and complications. Approximately 80% of first trimester miscarriages will pass successfully but may require a time frame of up to 8 weeks (ACOG Practice Bulletin 150 May 2015 "Early Pregnancy Loss").  Medical management using of misoprostil administered every 3-hrs as needed for up to 3 doses speeds up the time frame to completion significantly, has literature supporting its use up to 63 days or [redacted]w[redacted]d gestation and results in a passage rate of 84-85% (ACOG Practice Bulletin 143 March 2014 "Medical Management of First-Trimester Abortion").  Dilation and curettage has the highest rate of uterine evacuation, but carries with is operative cost, surgical and anesthetic risk.  While these risk are relatively small they nevertheless include infection, bleeding, uterine perforation, formation of uterine synechia, and in rare cases death.   We discussed repeat ultrasound and or trending HCG levels if the patient wishes to pursue these prior to making her decision.  Clinically I am confident of the diagnosis, but I do not want any doubts in the patient's mind regarding the plan of management she chooses to  adopt.  I will allow the patient and her family to discuss management options and she was advised to contact the office to arrange  final disposition one she has made her decision or should she have any follow up questions for myself.    The patient is A POS Performed at [facility], [address] [place], [place] [address] and rhogam is therefore not indicated.    Will arrange clinic follow up tomorrow with MD to further discuss

## 2021-01-08 NOTE — Progress Notes (Signed)
Sour odor, little discharge, no itchiness or irritation

## 2021-01-09 ENCOUNTER — Ambulatory Visit (INDEPENDENT_AMBULATORY_CARE_PROVIDER_SITE_OTHER): Payer: Medicaid Other | Admitting: Obstetrics and Gynecology

## 2021-01-09 VITALS — BP 90/60 | Wt 164.0 lb

## 2021-01-09 DIAGNOSIS — O021 Missed abortion: Secondary | ICD-10-CM

## 2021-01-09 NOTE — Progress Notes (Signed)
Notified about absent FHTs by Dr. Bonney Aid late 01/08/2021. Patient has a follow up appointment with Dr. Bjorn Pippin today on the 18th. Will discuss options for care at this appointment. Mirna Mires, CNM  01/09/2021 2:12 PM

## 2021-01-09 NOTE — Patient Instructions (Addendum)
[facility]  Madison Dixon [phone]   Please accept our heartfelt condolences and call us if you need an ear to listen.  Web Resources: www.babycenter.com www.acog.org [email or web address] [email or web address] This site has an excellent pamphlet that you can download on early pregnancy loss.  Books: Miscarriage: Women Sharing from the Heart by Marie Allen and Shelly Marks. Published by [employer]. Summarizes reactions of 100 women who experience miscarriage.  Madison Dixon [place] by Janice Cohn. Published by [employer]. Children's book on miscarriage.  Miscarriage: A Man's Book by Rick Wheat. Published by [employer] at [phone]. Written to help men understand reactions of both parents to miscarriage.  Miscarriage: A Shattered Dream by Sherokee Ilse and Linda Hammer Burns.  I Never Held You: A book about miscarriage, healing and recovery by Ellen M. DuBois.   MISCARRIAGE OR NEONATAL LOSS.a word to parents By Gretchen Gross, MSW  For those who experience a miscarriage or fetal loss, the world has turned upside down. Regardless of whether the pregnancy was planned and wanted, or unplanned and the parents were ambivalent, there are feelings of sadness, grief and a loss of equilibrium which effects both parents. Though we allow for these feelings when other deaths occur, we are remiss in acknowledging and allowing parents to grieve their pregnancy loss or miscarriage. We wrongly equate the depth of the loss to the size of the casket.  With pregnancy loss or death before birth, there is little guidance or expectation of what is normal grief. Others, who minimize or misunderstand the loss, may expect that you "get back to normal" quickly. With pressures to overlook the impact of the loss, healthy grieving processes can get stunted, overlooked, or stopped all together. I hope that this handout will  allow you to acknowledge and express whatever loss you feel, to share with others your sorrow and will encourage you and others to take a moment to understand and experience the depth of your emotions at this time.  A baby represents so much. It may represent hope, immortality, fulfillment of dreams, or an outward sign of a loving relationship. At the same time, pregnancy may bring on anxiety, fear, and an increase in commitment and responsibility. Understandably, few pregnant parents experience only one or the other of these emotions. Despite the type of emotional response to the pregnancy, as the process continues, what is central to almost all pregnancies is that over time, the bond between parent and child grows. Each day brings an expansion of the world. What was once routine now becomes more important. Diet, lifestyle, connection to family, time commitments, relationships with friends, spouse, others.nothing is as it was. These are the normal changes that a pregnant woman and her partner experience. When a baby dies, nothing that made sense before makes any sense after. We are left emotionally raw. Often our bodies respond in ways that assure us that we are crazy, but we are not. We are grieving a life that we never fully knew, a relationship that was too short. A part of us has died Too.  Mothers and fathers become more attached to a pregnancy as each day, week and month go by. Mothers have physical contact with the pregnancy, daily reminders of the growth and change occurring. They connect by talking to their bellies, by wondering what life will be like this time next year, look for clothes, cribs, pediatricians and more. Fathers often become attached in different ways: considering buying a new car or safe car   seat, working more to earn a little more money before the baby arrives, or by reconsidering the family's financial status. However bonding occurs, it generally increases as  the baby grows. For family members, co-workers and friends, the pregnancy may be less real. They may only know of the pregnancy from a physical or emotional distance. Since many of us are so uncomfortable with death and we work hard to deny it. With a prenatal loss, the lack of contact with the unborn baby may encourage others to minimize the loss, or even suggest that it was something to feel thankful for at this time suggesting that the pain felt now would be significantly less than at any other time in relationship to this child.  That is why it feels so crazy to be in such pain when others might not understand. You might feel that you are still standing at an emotional graveside and the world wonders "when you'll be ready to go back to work" or "why you still cry. It's been four months already." Be assured that it is the rest of the world that is, in this case, acting crazy. When a living child or adult dies, we have understandings, behaviors, rituals that though painful, enable us to feel cared for, comforted, acknowledged in our loss, and encourage healing. Unfortunately, too little of this is the case when there is a miscarriage or neonatal loss.  There are many difficult dilemmas that you might face in the wake of the loss. Do you have a funeral? How do you tell people? Do you publish an obituary? What do you do with the remains, both physical and emotional? I encourage people to find a formalized way to acknowledge the loss of the pregnancy and to honor the relationship that did and does still exist and will for the rest of your life. It is your choice as to whether this happens in a funeral service, in a memorial service, in a letter to the child who you did not meet or in some manner that makes sense to you religiously, spiritually or emotionally. It is not silly to ask friends or family to participate nor is it improper to keep this intensely personal and private. Grief, however,  needs to be a public process. We must grieve among others and with our loved ones. We do not all need to feel the same amount of pain in the loss, but we need to act as a loving community to support those most hurt at this time. Healthy grieving involves a strengthening of the bonds with others who we love and who love us. Some people ask a close friend or relative to contact others and inform them of the loss to minimize the telling of the story. Others find relief in telling friends, family and colleagues about the loss, in that it reminds us that the pregnancy was real and that this pain is real. Where one connection is broken, another is reinforced and strengthened. Grieving in isolation can be detrimental. By inviting important others to a funeral, tree planting, or sunset service and by taking them up on an offer to help will help your healing.  Some people have described aspects of grief after a pregnancy loss that are quite different from those present after the loss of an adult relative or friend. Some things that might seem "weird" or "abnormal" are indeed, quite normal following this kind of loss. G.W. Davidson described her experience in the book "Understanding: Death of the Wished-for Child." "  I kept searching for something. I wasn't sure what it was. All of a sudden one day in the kitchen, I spontaneously got out my kitchen scales and started weighing fruits and vegetables. I realized I was trying to find something that had the identical weight that the baby did.I found myself weighing my rolling pin.it happened to be the identical length and weight that the baby was." This describes the process of building memories. With miscarriage and neonatal loss, there are too few memories of the pregnancy or the baby itself. Parents may not ever be able to see or hold a baby in these cases, especially in the earlier trimesters, so saving mementos and gathering and providing information  are especially important. Minta Balsam described a parent who needs to see and feel something that had similar dimensions as the child. This is profoundly different than the grieving that happens when a 25 year old person dies, when we have years of memories, concrete possessions, photos and history. Though it is a different aspect of grief, it is normal, expected and healthy. Your grief will not make you crazy.  Also, do not be frightened if you feel this loss in a very physical manner. Your arms or chest may ache, convincing you that your heart has broken. Women have described this as a feeling of "empty arms". They also describe a similar feeling of an emptiness in their bellies, which was previously full. Some women also swear that they still feel the baby moving in their bellies. Many people report being woken to the sound of a crying baby, having vivid dreams about babies, trying to find their lost babies, or graphic dreams of injuries happening to themselves or others. Breasts may still feel as if they are filling and it may seem as if the body is unaware of the loss. These things can feel quite confusing. If you experience any of these responses, please tell someone about them. They are not a sign of insanity, they are aspects of grief. Keeping them to yourself increases the feelings of isolation and disequilibrium. Be reassured that these are normal experiences after the loss of a pregnancy.   A WORD ABOUT DIFFERENCES BETWEEN MEN'S AND WOMEN'S GRIEF  If most people do not fully understand the pain of a mother's grief through pregnancy loss, there is an even greater denial on society's part that the father is in pain. I have seen men in great pain and despair after the loss of a pregnancy and they say that nobody has ever asked them about their loss. People may only ask about their wife/partner's pain. To put it simply, one father, head bowed, shoulders heaving as he cried said  "if one more person asks me how Opal Sidles is, I'll fly apart. Don't they know I lost a child too? Don't they see the circles under my eyes, my face, my pain? Don't I count?" Couples often move through the grief process in different ways and at different times. Do not assume that your partner is better because they are wanting to take in a movie or go out to eat. This may just be a diversion, a need for a change of scenery, and a yearning for normalcy. Women are usually more comfortable crying and men find safety in action. For this reason, friction can develop between partners, when one wants the other to start behaving like they did before the loss or to cry along with them. Respect one another's process. Talk to your partner about  the specifics. If asked, "How are you today?" many people will just say "fine". Specifically relate that you have had a hard time with the holiday, or seeing a friend's new baby, and ask your partner how they felt in that moment. Sex is very often a very difficult prospect after a miscarriage or loss. It might remind a person of the conception, raise fears of another pregnancy and future children, or seem like too much pleasure to experience when one is otherwise in pain. Many men reconnect with their partner by being sexual and feel increasingly isolated when a partner does not respond. Talk. There may be a comfortable middle ground with each other. If the general patterns of the relationship shift toward uncomfortable dynamics, such as decreased communication, blaming one another, or increased absences from the home, it is important to seek counseling with a professional. This is not an easy time, you have had a loss. Be patient with yourself. Seek help and support from loved ones, friends, professionals. The grief will evolve with time and you will not always be in such immediate or raw pain. Be assured that you may never forget your pregnancy or your baby. Healing  takes time. I have compiled a list of books on grief and death that might be useful.  *Anna: A Daughter's Life. Eartha Inch, [place], 1993. A father's account of his grief at the loss of his infant daughter. *Explaining Death to Children. Claudie Leach, [place]. A valuable guide to help adults/parents explain death to children. *When Pregnancy Fails: Families Coping with Miscarriage, Ectopic Pregnancy, Stillbirth, and Infant Death. Erline Levine and Rocco Serene, [employer], 1989. A good resource to understand pregnancy loss, including some information on support for the family. *Hour of Gold, Hour of Lead: Diaries and Letters of Burt Knack. [place]. Danne Harbor writes of the pain and loss in the wake of their young child's abduction. *A Child Dies: A Portrait of Family Grief. Rande Lawman and Penelope [address] [place], [place]. Dealing with death of unborn children, infants, and children of all ages. Revised by Candiss Norse. Fredirick Maudlin, MS November, 2008    [place] WITH A MISCARRIAGE Sheppard Plumber, R.N., M.A.T.  You have had a miscarriage. You have lost a pregnancy. This is undoubtedly a difficult and sad time for you. Your loss may be hard to believe and it is frustrating to know you are helpless to change anything. "This can't possibly be happening to me. Why me? Why not someone else" I promise I'll be more careful if I can only have my baby back." These are common reactions to experiencing a miscarriage. Feelings of disbelief and anger, helplessness and guilt, depression and perhaps failure, are real and understandable. You may tell yourself that the guilt you feel is irrational, unreasonable or inappropriate, but it is there. "What did I do to cause this? Why wasn't I more careful? If only I had known. Is this a punishment for something I did?" You may find yourself recounting the days or weeks  before your miscarriage, searching for clues that you feel sure you must have missed; searching for valid reasons for how or why this happened. Having something "taken away" that you cherish feels shocking and unbelievable. You ask your doctor or midwife for an explanation; friends volunteer their opinions. In many cases, your questions of "how" or "why" are not satisfactorily answered. You may have some of the above thoughts or feelings and they  may be present in different combinations, differing degrees, and in some confusion. They are understandable and part of the process of beginning to cope with a significant loss. With any death, it is healthy and essential to allow yourself to experience grief whether you are a man or woman. The grieving process is not something begun and completed in a day, a week or a month. It takes time, understanding, support and acceptance. In the case of a miscarriage, this process is often more difficult because the death is not publicly acknowledged. There is no funeral to arrange and attend, no outward recognition of a loss. This may tend to increase feelings of isolation and loneliness. The term miscarriage is used here instead of the more medically correct term spontaneous abortion to avoid confusion with elective abortion. Some couples feel they must keep their grief invisible and "get over this quickly." Most employers will freely give time off to attend a family funeral, but many do not understand the reasonable and justified request for time off after a miscarriage. Loss of a longed-for pregnancy, regardless of how early, is a significant bereavement. Men and women may react to the miscarriage differently. Men may feel more helpless and frustrated than their wives because they often assume a passive-observer role. Waiting and watching can be harder than actively participating, regardless of the outcome. Men do experience a miscarriage in spite of not  actively participating in the physical loss. People have different ways of coping with a loss and you need to choose what is most helpful to you. Too often "being strong" is looked upon as a healthy way of coping, when, in fact, repressing and ignoring very real feelings is not helpful and contributes to serious coping problems. Some feelings and questions that men and women express and ask are:  It hangs over me like a black cloud. Though I've tried, I honestly don't know how to work this through. How do I say goodbye?  I feel extremely guilty. I guess I really didn't want this pregnancy. I feel relieved that it's over, but so guilty because I'm relieved. This isn't right.  I'm fine. Life must go on and I'm a strong person. There's really nothing to grieve for anyway, is there?  I feel pressure not to be sad. So I tend to cover up a lot.  How do I respond to well meaning but frustrating platitudes like, "Try again soon, dear. Another pregnancy will help." I would like to be pregnant again, but I believe a pregnancy should happen at a time of strength, not sadness.  How do I respond to silence and awkward attempts to avoid the whole issue? (You may be the one that decides to bring up the topic. Friends, relatives and acquaintances often do not know how to respond, so they say nothing).  I'm fine until my period comes. It is such a start reminder of the part of me that is lost.  My husband/wife and I always enjoyed a good sex life. Since my miscarriage, I have a hard time allowing myself to feel loving and sensuous. After all if such a loving and pleasurable experience as intercourse started something that ended in such a tragedy, it's too scary to think it might happen again. Why doesn't my husband/wife understand?  I have to face reality. Should I just force myself to attend my friend's baby shower?  Everyone was so supportive and caring when I was in the hospital. That was four months  ago.   I can't burden others with my sadness now. Why am I still sad? I should be over this thing by now. Knowing common facts about miscarriage probably will not blot out your negative feelings - denial, anger, disappointment, sadness - but facts can hold some comfort and can help cushion the sadness and guilt. For those wanting a child, it is frustrating and intensely disappointing to experience any miscarriage. However, most pregnancies that end in the first three months are imperfect in some way and, regardless of any precaution or intervention, are incapable of surviving and growing beyond approximately 14 weeks. This fact may not ease your sense of emptiness and sadness, but it may help ease your guilt and sense of assumed responsibility. Knowing clearly that you did not cause this to happen, that you are not directly responsible of your miscarriage by what you did or did not do, can bring a sense of relief and relaxation to your anxieties or guilt. A pregnancy that ends in later months is uniquely difficult also. Your protruding abdomen, your child's movements and heartbeat, are concrete proof of your expectant parenthood. A miscarriage occurring at 6 weeks or at 6 months causes a sudden change of events and feelings. Understandably, you may feel robbed or cheated of a promise of what was to be. It's easy to think that everyone else can have children. Surely I've been tricked or badly treated, you think. A fact not commonly know is that at least one in five pregnancies end in miscarriage, which indicates you are not alone. In a room of 25 couples, the odds are that 4 have experienced a miscarriage. Of these 8 people, many have shared your experience and feelings. Of course, this is not an easy topic to talk about, therefore it can appear that miscarriages rarely happen and that you are the only one coping with this loss. Realizing you are note alone can perhaps ease some of the shock and  disbelief of "WHY Me?"  Three actions will help: 1. Giver yourself permission to be sad. It is possible to be sad without becoming chronically depressed. Set limits for yourself if you are concerned about becoming overly depressed. If the sadness begins to wash over you while at work at 2 p.m., tell yourself that you cannot deal with it then, but make an agreement with yourself that at 8 p.m. that night you will. And then do it. Many individuals report a beginning sense of control over their grief when they realize they can create appropriate and private times to be sad, alone, or with a significant other person. If you think you may be severely depressed (i.e. having difficulty getting out of bed in the morning, withdrawal from friends and relatives, insomnia, extended loss of appetite or overeating, loss of the ability to enjoy anything), this problem is probably not something you can handle yourself. It is important to seek out professional help at this time, or at any time when you are feeling particularly discouraged about your Coping.  2. Find a supportive, trusted person and talk about your feelings and your questions. Build a support system, whether it be your spouse, partner, friend, relative, colleague, clergyman, support group or professional counselor. You may still feel some pangs of sadness when attending a baby shower, celebrating a holiday, seeing other pregnant couples or passing the date of your expected delivery, but once you work through your loss, coping with your situation will become easier.  3. Also part of the process of life   itself, is understanding and accepting incongruent or seemingly contradictory feelings. As one insightful women expressed, "Being happy for a friend who has just had a baby (or twins!) is a genuine feeling, but also resentment, anger and frustration are there, all wrapped into one package;" two apparently conflicting feelings, but both can  exist, be recognized, and accepted.  A miscarriage is an unexpected, often unexplained, death; with any loss you need to give yourself permission to grieve, to be angry, sad or relieved. There is no one appropriate reaction. Acknowledging your feelings that are real and understandable, though they may not be logical or rational, is part of the grieving process. Allowing the grieving process to happen is a positive way of coping and leads to health resolve and strength to look ahead. There may be times when you may not be able to put words to your feelings, but that does not need to stop you from seeking out and talking with an understanding person. Building your own support system will help you regain confidence and strength within yourself.  RECOMMENDED READING The following materials are all available through [employer], [address], [place], [place] [address]; phone [phone]. For Adults: Empty Arms by Peri Maris Empty Cradle, Broken Heart by Doyne Keel Ended Beginnings by C. Panuthos & C. Romeo Miscarriage a Probation officer. Resource Miscarriage - A Shattered Dream by Peri Maris Newborn Death a Probation officer. Resource Still To Be Born by [employer] When a Doctor, hospital by [employer]. Church et al (TCF) When [employer] Goodbye by P. Schwiebert and Mort Sawyers When Pregnancy Fails by Kathie Rhodes Borg & Edwyna Ready For Helping Other Children How Do We Tell the Children? by Shaefer & Nunzio Cory No New Baby by [employer] Talking About Death by Freddi Che Where's Jess?? A [employer]. Resource Other helpful materials available locally at [employer]: The Fall of Freddy the Leaf by Reggie Pile, PhD, [employer]. (for children) When Filbert Berthold is Forever - Learning to Live Again After the Loss of a Child by Carlyon Prows, [employer] of pelvic anatomy and gynecologic surgery (4th ed., pp. 205-212). [place], [place]: Elsevier.">  Dilation and Curettage or Vacuum  Curettage Dilation and curettage (D&C) and vacuum curettage are minor procedures. A D&C involves stretching the cervix (dilation) and scraping the inside lining of the uterus with surgical instruments (curettage). During a D&C, tissue is gently scraped from the lining of the uterus (endometrium), starting from the top portion of the uterus down to the lowest part of the uterus. During a vacuum curettage, the lining and tissue in the uterus are removed with the use of gentle suction. Curettage may be performed to either diagnose or treat a problem. For diagnosis A diagnostic curettage may be done if you have:  Irregular bleeding in the uterus.  Bleeding with the development of clots.  Spotting between menstrual periods.  Prolonged menstrual periods or other abnormal bleeding.  Bleeding after menopause.  No menstrual period (amenorrhea).  A change in size and shape of the uterus.  Abnormal endometrial cells discovered during a Pap test. For treatment Curettage may be done:  To remove an IUD (intrauterine device).  To remove remaining placenta after giving birth.  During an abortion.  During a miscarriage.  To remove growths in the lining of the uterus.  To remove some rare types of non-cancerous lumps (fibroids). Tell a health care provider about:  Any allergies you have, including allergies to prescribed medicine or latex.  All medicines you  are taking, including vitamins, herbs, eye drops, creams, and over-the-counter medicines.  Any blood-thinning medicine you may be taking.  Any problems you or family members have had with anesthetic medicines.  Any blood disorders you have.  Any surgeries you have had.  Your medical history and any medical conditions you have.  Whether you are pregnant or may be pregnant.  Recent vaginal infections you have had.  Recent menstrual periods, bleeding problems you have had, and what form of birth control (contraception) you  use. What are the risks? Generally, this is a safe procedure. However, problems may occur, including:  Infection.  Heavy vaginal bleeding.  Allergic reactions to medicines.  Damage to the cervix or other structures or organs.  Development of scar tissue (adhesions) inside the uterus. This can cause abnormal periods and may make it harder to get pregnant.  A hole (perforation) in the wall of the uterus. This is rare. What happens before the procedure? Staying hydrated Follow instructions from your health care provider about hydration, which may include:  Up to 2 hours before the procedure - you may continue to drink clear liquids, such as water, clear fruit juice, black coffee, and plain tea.   Eating and drinking restrictions Follow instructions from your health care provider about eating and drinking, which may include:  8 hours before the procedure - stop eating heavy meals or foods, such as meat, fried foods, or fatty foods.  6 hours before the procedure - stop eating light meals or foods, such as toast or cereal.  6 hours before the procedure - stop drinking milk or drinks that contain milk.  2 hours before the procedure - stop drinking clear liquids. If your health care provider told you to take your medicine(s) on the day of your procedure, take them with only a sip of water. Medicines  Ask your health care provider about: ? Changing or stopping your regular medicines. This is especially important if you are taking diabetes medicines or blood thinners. ? Taking medicines such as aspirin and ibuprofen. These medicines can thin your blood. Do not take these medicines unless your health care provider tells you to take them. ? Taking over-the-counter medicines, vitamins, herbs, and supplements.  You may be given a medicine to soften the cervix in order to help with dilation. Surgery safety Ask your health care provider what steps will be taken to help prevent infection. These  may include:  Removing hair at the surgery site.  Washing skin with a germ-killing soap.  Taking antibiotic medicine. General instructions  Do not use any products that contain nicotine or tobacco for at least 4 weeks before the procedure. These products include cigarettes, e-cigarettes, and chewing tobacco. If you need help quitting, ask your health care provider.  For 24 hours before your procedure, do not: ? Douche. ? Use tampons. ? Use medicines, creams, or suppositories in the vagina. ? Have sex.  You may be given a pregnancy test on the day of the procedure.  You may have a blood or urine sample taken.  Plan to have someone take you home from the hospital or clinic.  If you will be going home right after the procedure, plan to have someone with you for 24 hours. What happens during the procedure?  An IV will be inserted into one of your veins.  You will be given one of the following: ? A medicine that numbs the area in and around the cervix (local anesthetic). ? A medicine to  make you fall asleep (general anesthetic).  You will lie down on your back, with your feet in foot rests (stirrups).  The size and position of your uterus will be checked.  A lubricated instrument (speculum or Sims retractor) will be inserted into the back side of your vagina. The speculum will be used to hold apart the walls of your vagina so your health care provider can see your cervix.  A tool (tenaculum) will be attached to the lip of the cervix to stabilize it.  Your cervix will be softened and dilated. This may be done by: ? Taking medicine, either orally or vaginally. ? Having thin rods (laminaria) or gradual widening instruments (tapered dilators) inserted into your cervix.  A small, sharp, curved instrument (curette) will be used to scrape a small amount of tissue or cells from the endometrium or cervical canal. In some cases, gentle suction is applied with the curette.  The curette  will then be removed.  The cells will be taken to a lab for testing. The procedure may vary among health care providers and hospitals.   What happens after the procedure?  Your blood pressure, heart rate, breathing rate, and blood oxygen level will be monitored until you leave the hospital or clinic.  You may have mild cramping, backache, pain, and light bleeding or spotting. You may pass small blood clots from your vagina.  You may have to wear compression stockings. These stockings help to prevent blood clots and reduce swelling in your legs. Summary  Dilation and curettage (D&C) involves stretching (dilating) the cervix and scraping the inside lining of the uterus (curettage).  Follow your health care provider's instructions about when to stop eating and drinking, and whether to stop or change any medicines.  After the procedure, you may have mild cramping, backache, pain, and light bleeding or spotting. You may pass small blood clots from your vagina.  Plan to have someone take you home from the hospital or clinic. This information is not intended to replace advice given to you by your health care provider. Make sure you discuss any questions you have with your health care provider. Document Revised: 11/13/2019 Document Reviewed: 11/13/2019 Elsevier Patient Education  2021 [employer].

## 2021-01-09 NOTE — Progress Notes (Signed)
Patient ID: Madison Dixon, female   DOB: 1996-05-02, 25 y.o.   MRN: [id number]  Reason for Consult: Follow-up   Referred by Margaretann Loveless, Demetrius Charity*  Subjective:     HPI:  Madison Dixon is a 25 y.o. female. She was unfortunately diagnosed with a missed abortion yesterday. She denies cramping or vaginal bleeding   Past Medical History:  Diagnosis Date  . Anxiety   . Asthma    sports induced  . Bipolar disorder (HCC)   . Chronic pelvic pain in female 09/09/2016  . Depression   . Endometriosis determined by laparoscopy 09/09/2016  . Ovarian cyst   . Pilonidal cyst   . PONV (postoperative nausea and vomiting)    Family History  Problem Relation Age of Onset  . Hyperlipidemia Father   . GER disease Father    Past Surgical History:  Procedure Laterality Date  . COLONOSCOPY WITH ESOPHAGOGASTRODUODENOSCOPY (EGD)    . LAPAROSCOPY N/A 09/09/2016   Procedure: LAPAROSCOPY DIAGNOSTIC, FULGERATION OF ENDOMETRIOSIS;  Surgeon: Conard Novak, MD;  Location: [facility] ORS;  Service: Gynecology;  Laterality: N/A;  . PILONIDAL CYST EXCISION N/A 04/10/2015   Procedure: CYST EXCISION PILONIDAL EXTENSIVE;  Surgeon: Kieth Brightly, MD;  Location: [facility] ORS;  Service: General;  Laterality: N/A;    Short Social History:  Social History   Tobacco Use  . Smoking status: Never Smoker  . Smokeless tobacco: Never Used  Substance Use Topics  . Alcohol use: Not Currently    Alcohol/week: 0.0 standard drinks    Comment: drinks on friday nights    Allergies  Allergen Reactions  . Aripiprazole Nausea And Vomiting  . Vicodin [Hydrocodone-Acetaminophen] Nausea And Vomiting    Current Outpatient Medications  Medication Sig Dispense Refill  . Prenatal Vit-Fe Fumarate-FA (MULTIVITAMIN-PRENATAL) 27-0.8 MG TABS tablet Take 1 tablet by mouth daily at 12 noon.     No current facility-administered medications for this visit.    Review of Systems  Constitutional: Negative for chills,  fatigue, fever and unexpected weight change.  HENT: Negative for trouble swallowing.  Eyes: Negative for loss of vision.  Respiratory: Negative for cough, shortness of breath and wheezing.  Cardiovascular: Negative for chest pain, leg swelling, palpitations and syncope.  GI: Negative for abdominal pain, blood in stool, diarrhea, nausea and vomiting.  GU: Negative for difficulty urinating, dysuria, frequency and hematuria.  Musculoskeletal: Negative for back pain, leg pain and joint pain.  Skin: Negative for rash.  Neurological: Negative for dizziness, headaches, light-headedness, numbness and seizures.  Psychiatric: Negative for behavioral problem, confusion, depressed mood and sleep disturbance.        Objective:  Objective   Vitals:   01/09/21 1452  BP: 90/60  Weight: 164 lb (74.4 kg)   Body mass index is 30 kg/m.  Physical Exam Vitals and nursing note reviewed. Exam conducted with a chaperone present.  Constitutional:      Appearance: Normal appearance.  HENT:     Head: Normocephalic and atraumatic.  Eyes:     Extraocular Movements: Extraocular movements intact.     Pupils: Pupils are equal, round, and reactive to light.  Cardiovascular:     Rate and Rhythm: Normal rate and regular rhythm.  Pulmonary:     Effort: Pulmonary effort is normal.     Breath sounds: Normal breath sounds.  Abdominal:     General: Abdomen is flat.     Palpations: Abdomen is soft.  Musculoskeletal:     Cervical back: Normal range of  motion.  Skin:    General: Skin is warm and dry.  Neurological:     General: No focal deficit present.     Mental Status: She is alert and oriented to person, place, and time.  Psychiatric:        Behavior: Behavior normal.        Thought Content: Thought content normal.        Judgment: Judgment normal.     Assessment/Plan:     25 yo with missed abortion Patient provided with grief resources. Condolences for loss were offered.   The patient was  counseled that eghty percent (80%) of people who have a miscarriage will place a pregnancy completely in 8 weeks without intervention. Taking cytotec will increase this percentage to 85-90%.  Taking cytotec at home can be associated with uterine bleeding and cramping which the patient will need to be prepared to have within 1-4 hours of taking the medication. A support person should be present in case of an emergency. This is an affordable and safe alternative to a dilation and evacuation if precautions are taken.  A patient will need to follow up 2-4 weeks after taking cytotec to make sure that the pregnancy has completely passed. Another option for management of a incomplete or missed abortion is a dilation and evacuation. Having a dilation and evacuation of the pregnancy will result in a nearly 100% removal of the tissue. Complications such as hemorrhage, infection, uterine perforation, retained tissue or uterine scarring are possible but not common. Risks and benefits of each option discussed. Patient elected for Dilation and evacuation with Dr. Bonney Aid.    Note sent to schedule this procedure  A POS Performed at [facility], [address] [place], [place] [address]  More than 20 minutes were spent face to face with the patient in the room, reviewing the medical record, labs and images, and coordinating care for the patient. The plan of management was discussed in detail and counseling was provided.   Adelene Idler MD, [facility], [facility] 01/09/2021 3:35 PM

## 2021-01-11 LAB — NUSWAB VAGINITIS (VG)
Candida albicans, NAA: NEGATIVE
Candida glabrata, NAA: NEGATIVE
Trich vag by NAA: NEGATIVE

## 2021-01-12 ENCOUNTER — Telehealth: Payer: Self-pay

## 2021-01-12 ENCOUNTER — Other Ambulatory Visit
Admission: RE | Admit: 2021-01-12 | Discharge: 2021-01-12 | Disposition: A | Discharge status: Home / Self Care | Payer: Medicaid Other | Source: Ambulatory Visit | Attending: Obstetrics and Gynecology | Admitting: Obstetrics and Gynecology

## 2021-01-12 ENCOUNTER — Other Ambulatory Visit: Payer: Self-pay

## 2021-01-12 DIAGNOSIS — Z20822 Contact with and (suspected) exposure to covid-19: Secondary | ICD-10-CM | POA: Insufficient documentation

## 2021-01-12 DIAGNOSIS — Z01812 Encounter for preprocedural laboratory examination: Secondary | ICD-10-CM | POA: Insufficient documentation

## 2021-01-12 NOTE — Telephone Encounter (Signed)
She didn't I placed them

## 2021-01-12 NOTE — Telephone Encounter (Signed)
Called patient to schedule suction d&c w Bonney Aid  DOS 3/22  H&P N/A  Covid testing 3/21 @ [facility], [address]. Advised pt to quarantine until DOS.  Advised that pt may also receive calls from the hospital pharmacy and pre-service center.  Confirmed pt has [employer] as Editor, commissioning. No secondary insurance.

## 2021-01-12 NOTE — Telephone Encounter (Signed)
-----   Message from Natale Milch, MD sent at 01/09/2021  3:23 PM EDT ----- Surgery Booking Request Patient Full Name:  Madison Dixon  MRN: [id number]  DOB: April 06, 1996  Surgeon: Vena Austria MD Requested Surgery Date and Time: 01/13/2021 Primary Diagnosis AND Code: missed abortion Secondary Diagnosis and Code:  Surgical Procedure: Suction D&C RNFA Requested?: No L&D Notification: No Admission Status: same day surgery Length of Surgery: 75 min Special Case Needs: No H&P: No Phone Interview???:  Yes Interpreter: No Medical Clearance:  No Special Scheduling Instructions: No Any known health/anesthesia issues, diabetes, sleep apnea, latex allergy, defibrillator/pacemaker?: No Acuity: P3   (P1 highest, P2 delay may cause harm, P3 low, elective gyn, P4 lowest)

## 2021-01-13 ENCOUNTER — Encounter: Payer: Self-pay | Admitting: Obstetrics and Gynecology

## 2021-01-13 ENCOUNTER — Ambulatory Visit: Payer: Medicaid Other | Admitting: Anesthesiology

## 2021-01-13 ENCOUNTER — Encounter
Admission: EM | Disposition: A | Discharge status: Home / Self Care | Payer: Self-pay | Source: Home / Self Care | Attending: Obstetrics and Gynecology

## 2021-01-13 ENCOUNTER — Ambulatory Visit
Admission: EM | Admit: 2021-01-13 | Discharge: 2021-01-13 | Disposition: A | Discharge status: Home / Self Care | Payer: Medicaid Other | Attending: Obstetrics and Gynecology | Admitting: Obstetrics and Gynecology

## 2021-01-13 DIAGNOSIS — O019 Hydatidiform mole, unspecified: Secondary | ICD-10-CM | POA: Diagnosis not present

## 2021-01-13 DIAGNOSIS — O021 Missed abortion: Secondary | ICD-10-CM | POA: Insufficient documentation

## 2021-01-13 DIAGNOSIS — Z885 Allergy status to narcotic agent status: Secondary | ICD-10-CM | POA: Diagnosis not present

## 2021-01-13 HISTORY — PX: DILATION AND EVACUATION: SHX1459

## 2021-01-13 LAB — TYPE AND SCREEN
ABO/RH(D): A POS
Antibody Screen: NEGATIVE

## 2021-01-13 LAB — CBC
HCT: 36.3 % (ref 36.0–46.0)
Hemoglobin: 12.6 g/dL (ref 12.0–15.0)
MCH: 30.7 pg (ref 26.0–34.0)
MCHC: 34.7 g/dL (ref 30.0–36.0)
MCV: 88.3 fL (ref 80.0–100.0)
Platelets: 263 10*3/uL (ref 150–400)
RBC: 4.11 MIL/uL (ref 3.87–5.11)
RDW: 11.6 % (ref 11.5–15.5)
WBC: 8.1 10*3/uL (ref 4.0–10.5)
nRBC: 0 % (ref 0.0–0.2)

## 2021-01-13 LAB — SARS CORONAVIRUS 2 (TAT 6-24 HRS): SARS Coronavirus 2: NEGATIVE

## 2021-01-13 SURGERY — DILATION AND EVACUATION, UTERUS
Anesthesia: Monitor Anesthesia Care

## 2021-01-13 MED ORDER — PROPOFOL 500 MG/50ML IV EMUL
INTRAVENOUS | Status: DC | PRN
  Administered 2021-01-13: 150 ug/kg/min via INTRAVENOUS

## 2021-01-13 MED ORDER — ONDANSETRON HCL 4 MG/2ML IJ SOLN
INTRAMUSCULAR | Status: AC
  Filled 2021-01-13: qty 2

## 2021-01-13 MED ORDER — KETOROLAC TROMETHAMINE 30 MG/ML IJ SOLN
INTRAMUSCULAR | Status: DC | PRN
  Administered 2021-01-13: 15 mg via INTRAVENOUS

## 2021-01-13 MED ORDER — SODIUM CHLORIDE 0.9 % IV SOLN
INTRAVENOUS | Status: DC | PRN
  Administered 2021-01-13: 200 mg via INTRAVENOUS

## 2021-01-13 MED ORDER — CHLORHEXIDINE GLUCONATE 0.12 % MT SOLN
OROMUCOSAL | Status: AC
  Administered 2021-01-13: 15 mL
  Filled 2021-01-13: qty 15

## 2021-01-13 MED ORDER — IBUPROFEN 600 MG PO TABS: 600.0000 mg | ORAL_TABLET | Freq: Four times a day (QID) | ORAL | 3 refills | Status: DC | PRN

## 2021-01-13 MED ORDER — PROPOFOL 10 MG/ML IV BOLUS
INTRAVENOUS | Status: DC | PRN
  Administered 2021-01-13: 80 mg via INTRAVENOUS
  Administered 2021-01-13: 40 mg via INTRAVENOUS

## 2021-01-13 MED ORDER — KETOROLAC TROMETHAMINE 30 MG/ML IJ SOLN
INTRAMUSCULAR | Status: AC
  Filled 2021-01-13: qty 1

## 2021-01-13 MED ORDER — LACTATED RINGERS IV SOLN: INTRAVENOUS | Status: DC

## 2021-01-13 MED ORDER — PROMETHAZINE HCL 25 MG/ML IJ SOLN: 6.2500 mg | INTRAMUSCULAR | Status: DC | PRN

## 2021-01-13 MED ORDER — SCOPOLAMINE 1 MG/3DAYS TD PT72
MEDICATED_PATCH | TRANSDERMAL | Status: AC
  Filled 2021-01-13: qty 1

## 2021-01-13 MED ORDER — POVIDONE-IODINE 10 % EX SWAB
2.0000 "application " | Freq: Once | CUTANEOUS | Status: AC
  Administered 2021-01-13: 2 via TOPICAL

## 2021-01-13 MED ORDER — ONDANSETRON HCL 4 MG/2ML IJ SOLN
INTRAMUSCULAR | Status: DC | PRN
  Administered 2021-01-13: 4 mg via INTRAVENOUS

## 2021-01-13 MED ORDER — MIDAZOLAM HCL 2 MG/2ML IJ SOLN
INTRAMUSCULAR | Status: DC | PRN
  Administered 2021-01-13: 2 mg via INTRAVENOUS

## 2021-01-13 MED ORDER — DEXMEDETOMIDINE (PRECEDEX) IN NS 20 MCG/5ML (4 MCG/ML) IV SYRINGE
PREFILLED_SYRINGE | INTRAVENOUS | Status: AC
  Filled 2021-01-13: qty 5

## 2021-01-13 MED ORDER — TRAMADOL HCL 50 MG PO TABS
50.0000 mg | ORAL_TABLET | Freq: Once | ORAL | Status: AC
Start: 2021-01-13 — End: 2021-01-13

## 2021-01-13 MED ORDER — FENTANYL CITRATE (PF) 100 MCG/2ML IJ SOLN
INTRAMUSCULAR | Status: AC
  Filled 2021-01-13: qty 2

## 2021-01-13 MED ORDER — FENTANYL CITRATE (PF) 100 MCG/2ML IJ SOLN
25.0000 ug | INTRAMUSCULAR | Status: DC | PRN
Start: 2021-01-13 — End: 2021-01-13

## 2021-01-13 MED ORDER — MEPERIDINE HCL 50 MG/ML IJ SOLN
6.2500 mg | INTRAMUSCULAR | Status: DC | PRN
Start: 2021-01-13 — End: 2021-01-13

## 2021-01-13 MED ORDER — TRAMADOL HCL 50 MG PO TABS: 50.0000 mg | ORAL_TABLET | Freq: Four times a day (QID) | ORAL | 0 refills | Status: DC | PRN

## 2021-01-13 MED ORDER — CHLORHEXIDINE GLUCONATE 0.12 % MT SOLN: 15.0000 mL | Freq: Once | OROMUCOSAL | Status: AC

## 2021-01-13 MED ORDER — ORAL CARE MOUTH RINSE: 15.0000 mL | Freq: Once | OROMUCOSAL | Status: AC

## 2021-01-13 MED ORDER — SCOPOLAMINE 1 MG/3DAYS TD PT72
1.0000 | MEDICATED_PATCH | TRANSDERMAL | Status: DC
  Administered 2021-01-13: 1.5 mg via TRANSDERMAL

## 2021-01-13 MED ORDER — PROPOFOL 500 MG/50ML IV EMUL
INTRAVENOUS | Status: AC
  Filled 2021-01-13: qty 50

## 2021-01-13 MED ORDER — ONDANSETRON 4 MG PO TBDP: 4.0000 mg | ORAL_TABLET | Freq: Four times a day (QID) | ORAL | 0 refills | Status: DC | PRN

## 2021-01-13 MED ORDER — DOXYCYCLINE HYCLATE 100 MG IV SOLR
200.0000 mg | INTRAVENOUS | Status: AC
  Administered 2021-01-13: 200 mg via INTRAVENOUS
  Filled 2021-01-13: qty 200

## 2021-01-13 MED ORDER — DEXAMETHASONE SODIUM PHOSPHATE 10 MG/ML IJ SOLN
INTRAMUSCULAR | Status: AC
  Filled 2021-01-13: qty 1

## 2021-01-13 MED ORDER — FENTANYL CITRATE (PF) 100 MCG/2ML IJ SOLN
INTRAMUSCULAR | Status: DC | PRN
  Administered 2021-01-13 (×2): 50 ug via INTRAVENOUS

## 2021-01-13 MED ORDER — MIDAZOLAM HCL 2 MG/2ML IJ SOLN
INTRAMUSCULAR | Status: AC
  Filled 2021-01-13: qty 2

## 2021-01-13 MED ORDER — TRAMADOL HCL 50 MG PO TABS
ORAL_TABLET | ORAL | Status: AC
  Administered 2021-01-13: 50 mg via ORAL
  Filled 2021-01-13: qty 1

## 2021-01-13 MED ORDER — DEXAMETHASONE SODIUM PHOSPHATE 10 MG/ML IJ SOLN
INTRAMUSCULAR | Status: DC | PRN
  Administered 2021-01-13: 6 mg via INTRAVENOUS

## 2021-01-13 MED ORDER — DEXMEDETOMIDINE HCL IN NACL 200 MCG/50ML IV SOLN
INTRAVENOUS | Status: DC | PRN
  Administered 2021-01-13: 8 ug via INTRAVENOUS

## 2021-01-13 MED ORDER — SILVER NITRATE-POT NITRATE 75-25 % EX MISC
CUTANEOUS | Status: DC | PRN
  Administered 2021-01-13: 1

## 2021-01-13 SURGICAL SUPPLY — 20 items
CATH ROBINSON RED A/P 16FR (CATHETERS) ×2 IMPLANT
COVER WAND RF STERILE (DRAPES) ×2 IMPLANT
FILTER UTR ASPR SPEC (MISCELLANEOUS) ×1 IMPLANT
FLTR UTR ASPR SPEC (MISCELLANEOUS) ×2
GLOVE SURG ENC MOIS LTX SZ7 (GLOVE) ×2 IMPLANT
GOWN STRL REUS W/ TWL LRG LVL3 (GOWN DISPOSABLE) ×2 IMPLANT
GOWN STRL REUS W/TWL LRG LVL3 (GOWN DISPOSABLE) ×4
KIT BERKELEY 1ST TRIMESTER 3/8 (MISCELLANEOUS) ×2 IMPLANT
KIT TURNOVER CYSTO (KITS) ×2 IMPLANT
MANIFOLD NEPTUNE II (INSTRUMENTS) IMPLANT
NS IRRIG 500ML POUR BTL (IV SOLUTION) ×2 IMPLANT
PACK DNC HYST (MISCELLANEOUS) ×2 IMPLANT
PAD OB MATERNITY 4.3X12.25 (PERSONAL CARE ITEMS) ×2 IMPLANT
PAD PREP 24X41 OB/GYN DISP (PERSONAL CARE ITEMS) ×2 IMPLANT
SET BERKELEY SUCTION TUBING (SUCTIONS) ×2 IMPLANT
TOWEL OR 17X26 4PK STRL BLUE (TOWEL DISPOSABLE) ×2 IMPLANT
VACURETTE 10 RIGID CVD (CANNULA) IMPLANT
VACURETTE 12 RIGID CVD (CANNULA) IMPLANT
VACURETTE 8 RIGID CVD (CANNULA) IMPLANT
VACURETTE 8MM F TIP (MISCELLANEOUS) ×2 IMPLANT

## 2021-01-13 NOTE — Progress Notes (Signed)
Patient requested to be discharged ASAP, as she wanted to see her 3 month old daughter. Patient and caregiver verbalized understanding of discharge instructions, need to schedule visit one week in [place] office, and to call MD office and or Hospital with any questions or concerns.

## 2021-01-13 NOTE — Op Note (Signed)
Preoperative Diagnosis: 1) 24 y.o. with 8 week missed abortion  Postoperative Diagnosis: 1) 24 y.o.  with 8 week missed abortion  Operation Performed: Suction dilation and curettage  Indication: Missed abortion electing for surgical management  Anesthesia: .Monitor Anesthesia Care  Primary Surgeon: Vena Austria, MD  Assistant: none  Preoperative Antibiotics: 200mg  IV Doxycyline  Estimated Blood Loss: 100 mL  Urine Output:: ~53mL straight cath  Drains or Tubes: none  Implants: none  Specimens Removed: Products of conception  Complications: none  Intraoperative Findings:  10 week size anteverted uterus, cervix closed  Patient Condition: stable  Procedure in Detail:  Patient was taken to the operating room were she was administered general endotracheal anesthesia.  She was positioned in the dorsal lithotomy position utilizing Allen stirups, prepped and draped in the usual sterile fashion.  Uterus was noted to be 10 week in size, anteverted.   Prior to proceeding with the case a time out was performed.  Attention was turned to the patient's pelvis.  A red rubber catheter was used to empty the patient's bladder.  An operative speculum was placed to allow visualization of the cervix.  The anterior lip of the cervix was grasped with a single tooth tenaculum and the cervix was sequentially dilated using pratt dilators.  A size 8 flexible suction curette was then advanced to the uterine fundus.  Several passes were undertaken to clear the uterine contents.  Sharp curettage was then  performed nothing good uterine cry throughout the cavity.  A final pass of the suction curette was then undertaken.  The resulting specimen was sent to pathology.    The single tooth tenaculum was removed from the cervix.  The tenaculum sites and cervix were noted to be  Hemostatic before removing the operative speculum.  Sponge needle and instrument counts were corrects times two.  The patient tolerated  the procedure well and was taken to the recovery room in stable condition.

## 2021-01-13 NOTE — Anesthesia Preprocedure Evaluation (Signed)
Anesthesia Evaluation  Patient identified by MRN, date of birth, ID band Patient awake    Reviewed: Allergy & Precautions, NPO status , Patient's Chart, lab work & pertinent test results  History of Anesthesia Complications (+) PONV and history of anesthetic complications  Airway Mallampati: II  TM Distance: >3 FB Neck ROM: Full    Dental no notable dental hx.    Pulmonary asthma (exercise induced) ,    breath sounds clear to auscultation- rhonchi (-) wheezing      Cardiovascular Exercise Tolerance: Good (-) hypertension(-) CAD, (-) Past MI, (-) Cardiac Stents and (-) CABG  Rhythm:Regular Rate:Normal - Systolic murmurs and - Diastolic murmurs    Neuro/Psych neg Seizures PSYCHIATRIC DISORDERS Anxiety Depression Bipolar Disorder negative neurological ROS     GI/Hepatic negative GI ROS, Neg liver ROS,   Endo/Other  negative endocrine ROSneg diabetes  Renal/GU negative Renal ROS     Musculoskeletal negative musculoskeletal ROS (+)   Abdominal (+) + obese,   Peds  Hematology negative hematology ROS (+)   Anesthesia Other Findings Past Medical History: No date: Anxiety No date: Asthma     Comment:  sports induced No date: Bipolar disorder (HCC) 09/09/2016: Chronic pelvic pain in female No date: Depression 09/09/2016: Endometriosis determined by laparoscopy No date: Ovarian cyst No date: Pilonidal cyst No date: PONV (postoperative nausea and vomiting)   Reproductive/Obstetrics                             Anesthesia Physical Anesthesia Plan  ASA: II  Anesthesia Plan: General   Post-op Pain Management:    Induction: Intravenous  PONV Risk Score and Plan: 3 and Propofol infusion  Airway Management Planned: Natural Airway  Additional Equipment:   Intra-op Plan:   Post-operative Plan:   Informed Consent: I have reviewed the patients History and Physical, chart, labs and  discussed the procedure including the risks, benefits and alternatives for the proposed anesthesia with the patient or authorized representative who has indicated his/her understanding and acceptance.     Dental advisory given  Plan Discussed with: CRNA and Anesthesiologist  Anesthesia Plan Comments:         Anesthesia Quick Evaluation

## 2021-01-13 NOTE — H&P (Signed)
Obstetrics & Gynecology Surgery H&P    Chief Complaint: Scheduled Surgery   History of Present Illness: Patient is a 25 y.o. G2P1001 presenting for scheduled suction D&C, for the treatment or further evaluation of missed abortion.   Prior Treatments prior to proceeding with surgery include: ultrasound evalution  Preoperative Pap: 10/14/2020 NILM Preoperative Endometrial biopsy:  N/A Preoperative Ultrasound: 01/08/2021 CRL [redacted]w[redacted]d with no fetal heartones no interval fetal growth from ultrasound 12/04/2020   Review of Systems:10 point review of systems  Past Medical History:  Patient Active Problem List   Diagnosis Date Noted  . Supervision of normal pregnancy 11/05/2020      Nursing Staff Provider  Office Location  [facility] Dating   8w [place]  Language  English Anatomy US    Flu Vaccine   declined Genetic Screen  NIPS:  Neg x3, XX  TDaP vaccine    Hgb A1C or  GTT Early : 87 Third trimester :   Rhogam  n/a   LAB RESULTS   Feeding Plan  breast Blood Type --/--/A POS Performed at [facility], [address] [place], [place] [address]  [phone]) A+  Contraception  IUD-LNG Antibody Negative (01/19 1025)  Circumcision  n/a Rubella 2.25 (01/19 1025)  Pediatrician   RPR Non Reactive (01/19 1025)   Support Person  Brett Canales HBsAg Negative (01/19 1025)   Prenatal Classes  HIV Non Reactive (01/19 1025)    Varicella  non-immune  BTL Consent  n/a GBS  (For PCN allergy, check sensitivities)        VBAC Consent  n/a Pap  10/14/20 NILM    Hgb Electro   n/a    CF       SMA             . Inverted nipple 02/07/2020  . Chronic pelvic pain in female 09/09/2016  . Endometriosis determined by laparoscopy 09/09/2016  . Allergic rhinitis 05/10/2008    Past Surgical History:  Past Surgical History:  Procedure Laterality Date  . COLONOSCOPY WITH ESOPHAGOGASTRODUODENOSCOPY (EGD)    . LAPAROSCOPY N/A 09/09/2016   Procedure: LAPAROSCOPY DIAGNOSTIC, FULGERATION OF ENDOMETRIOSIS;   Surgeon: Conard Novak, MD;  Location: [facility] ORS;  Service: Gynecology;  Laterality: N/A;  . PILONIDAL CYST EXCISION N/A 04/10/2015   Procedure: CYST EXCISION PILONIDAL EXTENSIVE;  Surgeon: Kieth Brightly, MD;  Location: [facility] ORS;  Service: General;  Laterality: N/A;    Family History:  Family History  Problem Relation Age of Onset  . Hyperlipidemia Father   . GER disease Father     Social History:  Social History   Socioeconomic History  . Marital status: Single    Spouse name: Brett Canales  . Number of children: Not on file  . Years of education: Not on file  . Highest education level: Not on file  Occupational History  . Not on file  Tobacco Use  . Smoking status: Never Smoker  . Smokeless tobacco: Never Used  Vaping Use  . Vaping Use: Never used  Substance and Sexual Activity  . Alcohol use: Not Currently    Alcohol/week: 0.0 standard drinks    Comment: drinks on friday nights  . Drug use: Not Currently    Types: Marijuana  . Sexual activity: Yes    Birth control/protection: None  Other Topics Concern  . Not on file  Social History Narrative   ** Merged History Encounter **       Social Determinants of Corporate investment banker  Strain: Not on file  Food Insecurity: Not on file  Transportation Needs: Not on file  Physical Activity: Not on file  Stress: Not on file  Social Connections: Not on file  Intimate Partner Violence: Not on file    Allergies:  Allergies  Allergen Reactions  . Aripiprazole Nausea And Vomiting  . Vicodin [Hydrocodone-Acetaminophen] Nausea And Vomiting    Medications: Prior to Admission medications   Medication Sig Start Date End Date Taking? Authorizing Provider  Prenatal Vit-Fe Fumarate-FA (MULTIVITAMIN-PRENATAL) 27-0.8 MG TABS tablet Take 1 tablet by mouth in the morning. Patient not taking: Reported on 01/13/2021    [provider]    Physical Exam Vitals: Blood pressure 109/62, pulse 76, temperature (!) 97.5  F (36.4 C), temperature source Tympanic, resp. rate 16, height 5\' 2"  (1.575 m), weight 74.4 kg, last menstrual period 09/03/2020, SpO2 100 %, not currently breastfeeding. General: NAD HEENT: normocephalic, anicteric Pulmonary: No increased work of breathing Cardiovascular: RRR, distal pulses 2+ Genitourinary: deferred to OR Extremities: no edema, erythema, or tenderness Neurologic: Grossly intact Psychiatric: mood appropriate, affect full  Imaging 13/07/2020 OB LESS THAN 14 WEEKS WITH OB TRANSVAGINAL  Result Date: 01/08/2021 CLINICAL DATA:  Thirteen week gestation, unable to hear fetal heart tones in office EXAM: OBSTETRIC <14 WK ULTRASOUND TECHNIQUE: Transabdominal ultrasound was performed for evaluation of the gestation as well as the maternal uterus and adnexal regions. COMPARISON:  12/04/2020 FINDINGS: Intrauterine gestational sac: Present, single, irregular Yolk sac:  Not identified Embryo:  Present Cardiac Activity: Not identified Heart Rate: N/A bpm CRL:   22 mm   8 w 5 d                  02/01/2021 EDC: 08/15/2021 Subchorionic hemorrhage:  None visualized. Maternal uterus/adnexae: Gestational sac is irregular in appearance and with diffuse low level internal echogenicity. No fetal cardiac activity identified on either M-mode tracing or on cine analysis. Maternal uterus otherwise unremarkable, anteverted. RIGHT ovary normal size and morphology, 3.2 x 1.8 x 2.5 cm. LEFT ovary normal size and morphology, 3.3 x 3.3 x 2.4 cm. No free pelvic fluid or adnexal masses. IMPRESSION: Single intrauterine gestation is identified within an irregular gestational sac that contains debris. No fetal cardiac activity identified. Findings meet definitive criteria for failed pregnancy. This follows SRU consensus guidelines: Diagnostic Criteria for Nonviable Pregnancy Early in the First Trimester. 08/17/2021 J Med [phone]. These results will be called to the ordering clinician or representative by the Radiologist Assistant,  and communication documented in the PACS or [id number]. Electronically Signed   By: [employer] M.D.   On: 01/08/2021 17:20    Assessment: 25 y.o. G2P1001 presenting for scheduled scution D&C for missed abortion  Plan: 1) We briefly discussed management options including expectant management, medical management, and surgical management as well as their relative success rates and complications. Approximately 80% of first trimester miscarriages will pass successfully but may require a time frame of up to 8 weeks (ACOG Practice Bulletin 150 May 2015 "Early Pregnancy Loss").  Medical management using 05-26-2002 of misoprostil administered every 3-hrs as needed for up to 3 doses speeds up the time frame to completion significantly, has literature supporting its use up to 63 days or [redacted]w[redacted]d gestation and results in a passage rate of 84-85% (ACOG Practice Bulletin 143 March 2014 "Medical Management of First-Trimester Abortion").  Dilation and curettage has the highest rate of uterine evacuation, but carries with is operative cost, surgical and anesthetic risk.  While these risk  are relatively small they nevertheless include infection, bleeding, uterine perforation, formation of uterine synechia, and in rare cases death.    The patient is A POS  and rhogam is therefore not indicated.  Opts to proceed with D&C for surgical management   2) Routine postoperative instructions were reviewed with the patient and her family in detail today including the expected length of recovery and likely postoperative course.  The patient concurred with the proposed plan, giving informed written consent for the surgery today.  Patient instructed on the importance of being NPO after midnight prior to her procedure.  If warranted preoperative prophylactic antibiotics and SCDs ordered on call to the OR to meet SCIP guidelines and adhere to recommendation laid forth in ACOG Practice Bulletin Number 104 May 2009  "Antibiotic Prophylaxis  for Gynecologic Procedures".     Vena Austria, MD, [facility], [facility] 01/13/2021, 10:55 AM

## 2021-01-13 NOTE — Discharge Instructions (Signed)

## 2021-01-13 NOTE — Transfer of Care (Signed)
Immediate Anesthesia Transfer of Care Note  Patient: Madison Dixon  Procedure(s) Performed: Suction D&C (N/A )  Patient Location: PACU  Anesthesia Type:MAC  Level of Consciousness: awake, alert  and oriented  Airway & Oxygen Therapy: Patient Spontanous Breathing  Post-op Assessment: Report given to RN and Post -op Vital signs reviewed and stable  Post vital signs: Reviewed and stable  Last Vitals:  Vitals Value Taken Time  BP 96/71 01/13/21 1148  Temp    Pulse 81 01/13/21 1154  Resp 12 01/13/21 1154  SpO2 98 % 01/13/21 1154  Vitals shown include unvalidated device data.  Last Pain:  Vitals:   01/13/21 0851  TempSrc: Tympanic  PainSc: 0-No pain      Patients Stated Pain Goal: 0 (29/57/47 3403)  Complications: No complications documented.

## 2021-01-14 ENCOUNTER — Encounter: Payer: Medicaid Other | Admitting: Obstetrics and Gynecology

## 2021-01-14 ENCOUNTER — Encounter: Payer: Self-pay | Admitting: Obstetrics and Gynecology

## 2021-01-14 NOTE — Anesthesia Postprocedure Evaluation (Signed)
Anesthesia Post Note  Patient: Madison Dixon  Procedure(s) Performed: Suction D&C (N/A )  Patient location during evaluation: PACU Anesthesia Type: MAC Level of consciousness: awake and alert and oriented Pain management: pain level controlled Vital Signs Assessment: post-procedure vital signs reviewed and stable Respiratory status: spontaneous breathing, nonlabored ventilation and respiratory function stable Cardiovascular status: blood pressure returned to baseline and stable Postop Assessment: no signs of nausea or vomiting Anesthetic complications: no   No complications documented.   Last Vitals:  Vitals:   01/13/21 1230 01/13/21 1251  BP: 98/68 (!) 102/58  Pulse: 62 (!) 53  Resp: 17 16  Temp: 36.4 C 36.4 C  SpO2: 99% 99%    Last Pain:  Vitals:   01/14/21 0819  TempSrc:   PainSc: 0-No pain                 Selah Klang

## 2021-01-15 ENCOUNTER — Telehealth: Payer: Self-pay

## 2021-01-15 NOTE — Telephone Encounter (Signed)
Patient is scheduled for Mirena placement on 01/22/21 with AMS in [facility]

## 2021-01-15 NOTE — Telephone Encounter (Signed)
Pt calling to see if FMLA has been faxed to [employer].  [phone]

## 2021-01-15 NOTE — Telephone Encounter (Signed)
Waiting to get AMS sig tomorrow when hes in office. Can you let pt know?

## 2021-01-15 NOTE — Telephone Encounter (Signed)
Pt aware.

## 2021-01-19 NOTE — Telephone Encounter (Signed)
Noted  

## 2021-01-22 ENCOUNTER — Ambulatory Visit (INDEPENDENT_AMBULATORY_CARE_PROVIDER_SITE_OTHER): Payer: Medicaid Other | Admitting: Obstetrics and Gynecology

## 2021-01-22 ENCOUNTER — Encounter: Payer: Self-pay | Admitting: Obstetrics and Gynecology

## 2021-01-22 ENCOUNTER — Other Ambulatory Visit: Payer: Self-pay

## 2021-01-22 VITALS — BP 120/70 | Ht 62.0 in | Wt 165.0 lb

## 2021-01-22 DIAGNOSIS — Z30430 Encounter for insertion of intrauterine contraceptive device: Secondary | ICD-10-CM

## 2021-01-22 DIAGNOSIS — O011 Incomplete and partial hydatidiform mole: Secondary | ICD-10-CM | POA: Diagnosis not present

## 2021-01-22 MED ORDER — BUPROPION HCL ER (XL) 150 MG PO TB24: 150.0000 mg | ORAL_TABLET | Freq: Every day | ORAL | 2 refills | Status: DC

## 2021-01-22 NOTE — Progress Notes (Signed)
Postoperative Follow-up Patient presents post op from suction D&C 1weeks ago for missed abortion.  Subjective: Patient reports some improvement in her preop symptoms. Eating a regular diet without difficulty. Pain is controlled without any medications.  Activity: normal activities of daily living. Bleeding stopped yesterday, still having some light cramping  Objective: Last menstrual period 09/03/2020, not currently breastfeeding.  General: NAD Pulmonary: no increased work of breathing Abdomen: soft, non-tender, non-distended, incision(s) D/C/I GU: normal external female genitalia normal cervix, no CMT, uterus normal in shape and contour, no adnexal tenderness or masses Extremities: no edema Neurologic: normal gait    Admission on 01/13/2021, Discharged on 01/13/2021  Component Date Value Ref Range Status  . ABO/RH(D) 01/13/2021 A POS   Final  . Antibody Screen 01/13/2021 NEG   Final  . Sample Expiration 01/13/2021    Final                   Value:01/16/2021,2359 Performed at [facility], [address] [place], [place] [address]   . WBC 01/13/2021 8.1  4.0 - 10.5 K/uL Final  . RBC 01/13/2021 4.11  3.87 - 5.11 MIL/uL Final  . Hemoglobin 01/13/2021 12.6  12.0 - 15.0 g/dL Final  . HCT 37/16/9678 36.3  36.0 - 46.0 % Final  . MCV 01/13/2021 88.3  80.0 - 100.0 fL Final  . MCH 01/13/2021 30.7  26.0 - 34.0 pg Final  . MCHC 01/13/2021 34.7  30.0 - 36.0 g/dL Final  . RDW 93/81/0175 11.6  11.5 - 15.5 % Final  . Platelets 01/13/2021 263  150 - 400 K/uL Final  . nRBC 01/13/2021 0.0  0.0 - 0.2 % Final   Performed at [facility], [address], [place], [place] [address]  . SURGICAL PATHOLOGY 01/13/2021    Final-Edited                   Value:SURGICAL PATHOLOGY CASE: [id number] PATIENT: [facility] Surgical Pathology Report     Specimen Submitted: A. Products of conception  Clinical History: Missed abortion      DIAGNOSIS: A.  PRODUCTS  OF CONCEPTION; DILATATION AND CURETTAGE: - CHORIONIC VILLI AND FETAL TISSUE CONSISTENT WITH PRODUCTS OF CONCEPTION. - ABNORMAL VILLOUS MORPHOLOGY, SEE COMMENT.  Comment: There is a mix of fibrous and edematous chorionic villi with irregular morphology, trophoblast hyperplasia, and inclusions. 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 Material will be submitted for molecular genetic testing for further evaluation. The results will be resulted in an addendum.  GROSS DESCRIPTION: A. Labeled: Products of conception Received: Formalin Collection time: 11:30 AM on 01/13/2021 Placed into formalin time: 11:33 AM on 01/13/2021 Tissue fragment(s): Multiple Size: Aggregate, 6.5 x 6.5 x 2.6 cm Villous tissue: There are scat                         tered areas of potential villi. Fetal tissue: None grossly appreciated. Comment: Received within a white plastic mesh collection device and Telfa pads are fragments of tan-pink, soft, and lobulated tissue with scattered areas of blood clot.  There are also scattered areas of white-pink membranous-like soft tissue.  Block summary: 1 - 5 - representative soft tissue fragments      1 - potential villi  RB 01/13/2021   Final Diagnosis performed by Elijah Birk, MD.   Electronically signed 01/15/2021 11:56:23AM The electronic signature indicates that the named Attending Pathologist has evaluated the specimen  Technical component performed at [facility], [address] [place], [place] [address] Lab: [phone] Dir: Jolene Schimke, MD, MMM  Professional component performed at [facility], [facility], [address] [place], [place], [place] [address] Lab: [phone] Dir: Georgiann Cocker. Oneita Kras, MD     GYNECOLOGY OFFICE PROCEDURE NOTE  KAYAH HECKER is a 25 y.o. G2P1001 here for a Mirena  IUD insertion. No GYN concerns.  Last pap smear was on 11/29/2020 and was normal.   The indication for her IUD is  contraception/cycle control.  IUD Insertion Procedure Note Patient identified, informed consent performed, consent signed.   Discussed risks of irregular bleeding, cramping, infection, malpositioning, expulsion or uterine perforation of the IUD (1:1000 placements)  which may require further procedure such as laparoscopy.  IUD while effective at preventing pregnancy do not prevent transmission of sexually transmitted diseases and use of barrier methods for this purpose was discussed. Time out was performed.  Urine pregnancy test negative.  Speculum placed in the vagina.  Cervix visualized.  Cleaned with Betadine x 2.  Grasped anteriorly with a single tooth tenaculum.  Uterus sounded to 8 cm. IUD placed per manufacturer's recommendations.  Strings trimmed to 3 cm. Tenaculum was removed, good hemostasis noted.  Patient tolerated procedure well.   Patient was given post-procedure instructions.  She was advised to have backup contraception for one week.  Patient was also asked to check IUD strings periodically and follow up in 6 weeks for IUD check.  Depression screen [facility] 2/9 08/08/2018 05/16/2015  Decreased Interest 0 3  Down, Depressed, Hopeless 0 3  PHQ - 2 Score 0 6  Altered sleeping - 3  Tired, decreased energy - 3  Change in appetite - 1  Feeling bad or failure about yourself  - 3  Trouble concentrating - 0  Moving slowly or fidgety/restless - 1  Suicidal thoughts - 0  PHQ-9 Score - 17  Difficult doing work/chores - Extremely dIfficult   No flowsheet data found.    Edinburgh Postnatal Depression Scale - 01/22/21 1041      Edinburgh Postnatal Depression Scale:  In the Past 7 Days   I have been able to laugh and see the funny side of things. 1    I have looked forward with enjoyment to things. 3    I have blamed myself unnecessarily when things went wrong. 0    I have been anxious or worried for no good reason. 0    I have felt scared or panicky for no good reason. 0    Things have  been getting on top of me. 0    I have been so unhappy that I have had difficulty sleeping. 3    I have felt sad or miserable. 2    I have been so unhappy that I have been crying. 2    The thought of harming myself has occurred to me. 0    Edinburgh Postnatal Depression Scale Total 11            Assessment: 25 y.o. s/p suction D&C stable  Plan: Patient has done well after surgery with no apparent complications.  I have discussed the post-operative course to date, and the expected progress moving forward.  The patient understands what complications to be concerned about.  I will see the patient in routine follow up, or sooner if needed.    Activity plan: No restriction.  IUD placed today  Concern for molar pregnancy on pathology with confirmatory molecular testing pending -  repeat HCG today  Depression start Wellbutrin XR 150mg   Return in about 2 weeks (around 02/05/2021) for 2 weeks medciaton follow up (phone in person) 6 weeks IUD string check and 6 week postop.    02/07/2021, MD, Vena Austria [facility], [facility] 01/22/2021, 10:32 AM

## 2021-01-23 LAB — BETA HCG QUANT (REF LAB): hCG Quant: 1695 m[IU]/mL

## 2021-01-27 LAB — SURGICAL PATHOLOGY

## 2021-02-04 ENCOUNTER — Ambulatory Visit (INDEPENDENT_AMBULATORY_CARE_PROVIDER_SITE_OTHER): Payer: Medicaid Other | Admitting: Obstetrics and Gynecology

## 2021-02-04 ENCOUNTER — Other Ambulatory Visit: Payer: Self-pay

## 2021-02-04 ENCOUNTER — Encounter: Payer: Self-pay | Admitting: Obstetrics and Gynecology

## 2021-02-04 VITALS — BP 104/70 | HR 91 | Wt 162.0 lb

## 2021-02-04 DIAGNOSIS — F419 Anxiety disorder, unspecified: Secondary | ICD-10-CM

## 2021-02-04 DIAGNOSIS — F32A Depression, unspecified: Secondary | ICD-10-CM

## 2021-02-04 DIAGNOSIS — O020 Blighted ovum and nonhydatidiform mole: Secondary | ICD-10-CM

## 2021-02-04 MED ORDER — METRONIDAZOLE 500 MG PO TABS: 500.0000 mg | ORAL_TABLET | Freq: Two times a day (BID) | ORAL | 0 refills | Status: AC

## 2021-02-04 NOTE — Progress Notes (Signed)
Obstetrics & Gynecology Office Visit   Chief Complaint:  Chief Complaint  Patient presents with  . Follow-up    F/U medication - urine still has a odor, no other sx. Lt shoulder pain. RM 2    History of Present Illness: The patient is a 25 y.o. female presenting follow up for symptoms of anxiety and depression.  The patient is currently taking Wellbutrin XL 150mg  for the management of her symptoms.  She has not had any recent situational stressors.  She reports symptoms of anhedonia, insomnia, irritability, increased appetite, feelings of guilt and feelings of worthlessness.  She denies risk taking behavior, decreased appetite, suicidal ideation, homicidal ideation, auditory hallucinations and visual hallucinations. Symptoms have improved since last visit.     No side-effects noted since starting Wellbutrin.  The patient does have a pre-existing history of depression and anxiety.  She  does not a prior history of suicide attempts.    Has noted some vaginal discharge and odor.  Still having some brown bleeding since IUD placement two weeks ago.  Occasional right shoulder pain.  Not correlate with po intake or bowl movements.  No nausea or emesis.  Reports was present off and on throughout this recent miscarriage.     Review of Systems: Review of Systems  Constitutional: Negative.   Gastrointestinal: Negative.   Genitourinary: Negative.   Psychiatric/Behavioral: Positive for depression. Negative for hallucinations, memory loss, substance abuse and suicidal ideas. The patient is nervous/anxious and has insomnia.      Past Medical History:  Past Medical History:  Diagnosis Date  . Anxiety   . Asthma    sports induced  . Bipolar disorder (HCC)   . Chronic pelvic pain in female 09/09/2016  . Depression   . Endometriosis determined by laparoscopy 09/09/2016  . Ovarian cyst   . Pilonidal cyst   . PONV (postoperative nausea and vomiting)     Past Surgical History:  Past  Surgical History:  Procedure Laterality Date  . COLONOSCOPY WITH ESOPHAGOGASTRODUODENOSCOPY (EGD)    . DILATION AND EVACUATION N/A 01/13/2021   Procedure: Suction D&C;  Surgeon: 01/15/2021, MD;  Location: [facility] ORS;  Service: Gynecology;  Laterality: N/A;  . LAPAROSCOPY N/A 09/09/2016   Procedure: LAPAROSCOPY DIAGNOSTIC, FULGERATION OF ENDOMETRIOSIS;  Surgeon: 09/11/2016, MD;  Location: [facility] ORS;  Service: Gynecology;  Laterality: N/A;  . PILONIDAL CYST EXCISION N/A 04/10/2015   Procedure: CYST EXCISION PILONIDAL EXTENSIVE;  Surgeon: 04/12/2015, MD;  Location: [facility] ORS;  Service: General;  Laterality: N/A;    Gynecologic History: Patient's last menstrual period was 09/03/2020 (exact date).  Obstetric History: G2P1001  Family History:  Family History  Problem Relation Age of Onset  . Hyperlipidemia Father   . GER disease Father     Social History:  Social History   Socioeconomic History  . Marital status: Single    Spouse name: 13/07/2020  . Number of children: Not on file  . Years of education: Not on file  . Highest education level: Not on file  Occupational History  . Not on file  Tobacco Use  . Smoking status: Never Smoker  . Smokeless tobacco: Never Used  Vaping Use  . Vaping Use: Never used  Substance and Sexual Activity  . Alcohol use: Not Currently    Alcohol/week: 0.0 standard drinks    Comment: drinks on friday nights  . Drug use: Not Currently    Types: Marijuana  . Sexual activity: Yes  Birth control/protection: None  Other Topics Concern  . Not on file  Social History Narrative   ** Merged History Encounter **       Social Determinants of Health   Financial Resource Strain: Not on file  Food Insecurity: Not on file  Transportation Needs: Not on file  Physical Activity: Not on file  Stress: Not on file  Social Connections: Not on file  Intimate Partner Violence: Not on file    Allergies:  Allergies  Allergen Reactions  .  Aripiprazole Nausea And Vomiting  . Vicodin [Hydrocodone-Acetaminophen] Nausea And Vomiting    Medications: Prior to Admission medications   Medication Sig Start Date End Date Taking? Authorizing Provider  buPROPion (WELLBUTRIN XL) 150 MG 24 hr tablet Take 1 tablet (150 mg total) by mouth daily. 01/22/21   Vena Austria, MD  ibuprofen (ADVIL) 600 MG tablet Take 1 tablet (600 mg total) by mouth every 6 (six) hours as needed. 01/13/21   Vena Austria, MD  ondansetron (ZOFRAN ODT) 4 MG disintegrating tablet Take 1 tablet (4 mg total) by mouth every 6 (six) hours as needed for nausea. 01/13/21   Vena Austria, MD  Prenatal Vit-Fe Fumarate-FA (MULTIVITAMIN-PRENATAL) 27-0.8 MG TABS tablet Take 1 tablet by mouth in the morning. Patient not taking: Reported on 01/13/2021    [provider]  traMADol (ULTRAM) 50 MG tablet Take 1 tablet (50 mg total) by mouth every 6 (six) hours as needed. 01/13/21 01/13/22  Vena Austria, MD    Physical Exam Vitals:  Vitals:   02/04/21 0902  BP: 104/70  Pulse: 91   Patient's last menstrual period was 09/03/2020 (exact date).  General: NAD HEENT: normocephalic, anicteric Pulmonary: No increased work of breathing Neurologic: Grossly intact Psychiatric: mood appropriate, affect full    No flowsheet data found.  Depression screen [facility] 2/9 08/08/2018 05/16/2015  Decreased Interest 0 3  Down, Depressed, Hopeless 0 3  PHQ - 2 Score 0 6  Altered sleeping - 3  Tired, decreased energy - 3  Change in appetite - 1  Feeling bad or failure about yourself  - 3  Trouble concentrating - 0  Moving slowly or fidgety/restless - 1  Suicidal thoughts - 0  PHQ-9 Score - 17  Difficult doing work/chores - Extremely dIfficult    Depression screen [facility] 2/9 08/08/2018 05/16/2015  Decreased Interest 0 3  Down, Depressed, Hopeless 0 3  PHQ - 2 Score 0 6  Altered sleeping - 3  Tired, decreased energy - 3  Change in appetite - 1  Feeling bad or failure  about yourself  - 3  Trouble concentrating - 0  Moving slowly or fidgety/restless - 1  Suicidal thoughts - 0  PHQ-9 Score - 17  Difficult doing work/chores - Extremely dIfficult   PHQ9 SCORE ONLY 08/08/2018 05/16/2015  PHQ-9 Total Score 0 17     Assessment: 25 y.o. G2P1001 follow up molar pregnancy and anxiety/depression  Plan: Problem List Items Addressed This Visit   None   Visit Diagnoses    Molar pregnancy    -  Primary   Relevant Orders   Beta hCG quant (ref lab)   Anxiety and depression         Anxiety/Depression 1) Continue Wellbutrin XL 150mg  po daily  2) Thyroid and B12 screen has not been obtained previously   Molar Pregnancy 1) Repeat HCG ordered today 2) Continue to trend HCG level to negative  BV 1) Recent vaginal prep with D&C likely risk factor.  Empiric  treatment initiated with Flagyl  IUD 1) Return in about 4 weeks (around 03/04/2021) for IUD string check.        Vena Austria, MD, [facility], [facility] 02/04/2021, 9:07 AM

## 2021-02-05 ENCOUNTER — Encounter: Payer: Medicaid Other | Admitting: Obstetrics

## 2021-02-05 LAB — BETA HCG QUANT (REF LAB): hCG Quant: 1078 m[IU]/mL

## 2021-02-16 DIAGNOSIS — Z20822 Contact with and (suspected) exposure to covid-19: Secondary | ICD-10-CM | POA: Diagnosis not present

## 2021-02-25 ENCOUNTER — Telehealth: Payer: Self-pay

## 2021-02-25 NOTE — Telephone Encounter (Signed)
03/12/21 with AMS

## 2021-02-25 NOTE — Telephone Encounter (Signed)
Pt calling; has appt next week that she needs to reschedule; has been trying to call but unable to get thru.  [phone]

## 2021-03-05 ENCOUNTER — Ambulatory Visit: Payer: Medicaid Other | Admitting: Obstetrics and Gynecology

## 2021-03-12 ENCOUNTER — Ambulatory Visit: Payer: Medicaid Other | Admitting: Obstetrics and Gynecology

## 2021-03-12 NOTE — Progress Notes (Deleted)
Obstetrics & Gynecology Office Visit   Chief Complaint: No chief complaint on file.   History of Present Illness: 25 y.o. patient presenting for follow up of Mirena IUD IUD placement 01/22/2021.  The indication for her IUD was contraception.  She {Actions; denies/reports/admits to:19208} any complications since her IUD placement.  Still having some occasional spotting.  {ACTION; IS/IS BCW:[id number]} able to feel strings.    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    01/22/2021 HCG 1,659mIU/mL 02/04/2021 HCG 1,060mIU/mL  She was also started on Wellbutrin XL 150mg  daily on 01/22/2021 for symptoms of depression  Review of Systems: ROS  Past Medical History:  Past Medical History:  Diagnosis Date  . Anxiety   . Asthma    sports induced  . Bipolar disorder (HCC)   . Chronic pelvic pain in female 09/09/2016  . Depression   . Endometriosis determined by laparoscopy 09/09/2016  . Ovarian cyst   . Pilonidal cyst   . PONV (postoperative nausea and vomiting)     Past Surgical History:  Past Surgical History:  Procedure Laterality Date  . COLONOSCOPY WITH ESOPHAGOGASTRODUODENOSCOPY (EGD)    . DILATION AND EVACUATION N/A 01/13/2021   Procedure: Suction D&C;  Surgeon: 01/15/2021, MD;  Location: [facility];  Service: Gynecology;  Laterality: N/A;  . LAPAROSCOPY N/A 09/09/2016   Procedure: LAPAROSCOPY DIAGNOSTIC, FULGERATION OF ENDOMETRIOSIS;  Surgeon: 09/11/2016, MD;  Location: [facility];  Service: Gynecology;  Laterality: N/A;  . PILONIDAL CYST EXCISION N/A 04/10/2015   Procedure: CYST EXCISION PILONIDAL EXTENSIVE;  Surgeon: 04/12/2015, MD;  Location: [facility];  Service: General;  Laterality: N/A;    Gynecologic History: Patient's last menstrual period was 09/03/2020 (exact date).  Obstetric History: G2P1001  Family History:  Family History  Problem Relation Age of Onset  . Hyperlipidemia Father    . GER disease Father     Social History:  Social History   Socioeconomic History  . Marital status: Single    Spouse name: 13/07/2020  . Number of children: Not on file  . Years of education: Not on file  . Highest education level: Not on file  Occupational History  . Not on file  Tobacco Use  . Smoking status: Never Smoker  . Smokeless tobacco: Never Used  Vaping Use  . Vaping Use: Never used  Substance and Sexual Activity  . Alcohol use: Not Currently    Alcohol/week: 0.0 standard drinks    Comment: drinks on friday nights  . Drug use: Not Currently    Types: Marijuana  . Sexual activity: Yes    Birth control/protection: None  Other Topics Concern  . Not on file  Social History Narrative   ** Merged History Encounter **       Social Determinants of Health   Financial Resource Strain: Not on file  Food Insecurity: Not on file  Transportation Needs: Not on file  Physical Activity: Not on file  Stress: Not on file  Social Connections: Not on file  Intimate Partner Violence: Not on file    Allergies:  Allergies  Allergen Reactions  . Aripiprazole Nausea And Vomiting  . Vicodin [Hydrocodone-Acetaminophen] Nausea And Vomiting    Medications: Prior to Admission medications   Medication Sig Start Date End Date Taking? Authorizing Provider  buPROPion (WELLBUTRIN XL) 150 MG 24 hr tablet Take 1 tablet (150 mg total) by mouth daily. 01/22/21   01/24/21, MD  ibuprofen (ADVIL) 600 MG tablet Take 1 tablet (600 mg total) by mouth every 6 (six) hours as needed. 01/13/21   Vena Austria, MD  ondansetron (ZOFRAN ODT) 4 MG disintegrating tablet Take 1 tablet (4 mg total) by mouth every 6 (six) hours as needed for nausea. 01/13/21   Vena Austria, MD  traMADol (ULTRAM) 50 MG tablet Take 1 tablet (50 mg total) by mouth every 6 (six) hours as needed. 01/13/21 01/13/22  Vena Austria, MD    Physical Exam Last menstrual period 09/03/2020, not currently  breastfeeding. Patient's last menstrual period was 09/03/2020 (exact date).  General: NAD HEENT: normocephalic, anicteric Pulmonary: No increased work of breathing  Genitourinary:  External: Normal external female genitalia.  Normal urethral meatus, normal  Bartholin's and Skene's glands.    Vagina: Normal vaginal mucosa, no evidence of prolapse.    Cervix: Grossly normal in appearance, no bleeding, IUD strings visualized 2cm  Uterus: Non-enlarged, mobile, normal contour.  No CMT  Adnexa: ovaries non-enlarged, no adnexal masses  Rectal: deferred  Lymphatic: no evidence of inguinal lymphadenopathy Extremities: no edema, erythema, or tenderness Neurologic: Grossly intact Psychiatric: mood appropriate, affect full  Female chaperone present for pelvic and breast  portions of the physical exam  Assessment: 25 y.o. G2P1001 No problem-specific Assessment & Plan notes found for this encounter.   Plan: Problem List Items Addressed This Visit   None      1.  The patient was given instructions to check her IUD strings monthly and call with any problems or concerns.  She should call for fevers, chills, abnormal vaginal discharge, pelvic pain, or other complaints.  2.   IUDs while effective at preventing pregnancy do not prevent transmission of sexually transmitted diseases and use of barrier methods for this purpose was discussed.  Low overall incidence of failure with 99.7% efficacy rate in typical use.  The patient has not contraindication to IUD placement.  3.  She will return for a annual exam in 1 year.  All questions answered.  4) A total of 15 minutes were spent in face-to-face contact with the patient during this encounter with over half of that time devoted to counseling and coordination of care.  5) No follow-ups on file.   Vena Austria, MD, Evern [facility], [facility] 03/12/2021, 8:15 AM

## 2021-03-20 ENCOUNTER — Ambulatory Visit (INDEPENDENT_AMBULATORY_CARE_PROVIDER_SITE_OTHER): Payer: Medicaid Other | Admitting: Obstetrics & Gynecology

## 2021-03-20 ENCOUNTER — Encounter: Payer: Self-pay | Admitting: Obstetrics & Gynecology

## 2021-03-20 ENCOUNTER — Ambulatory Visit
Admission: RE | Admit: 2021-03-20 | Discharge: 2021-03-20 | Disposition: A | Discharge status: Home / Self Care | Payer: Medicaid Other | Source: Ambulatory Visit | Attending: Obstetrics & Gynecology | Admitting: Obstetrics & Gynecology

## 2021-03-20 ENCOUNTER — Other Ambulatory Visit: Payer: Self-pay

## 2021-03-20 VITALS — BP 120/80 | Wt 159.0 lb

## 2021-03-20 DIAGNOSIS — T8332XA Displacement of intrauterine contraceptive device, initial encounter: Secondary | ICD-10-CM | POA: Diagnosis not present

## 2021-03-20 DIAGNOSIS — N939 Abnormal uterine and vaginal bleeding, unspecified: Secondary | ICD-10-CM | POA: Insufficient documentation

## 2021-03-20 DIAGNOSIS — R102 Pelvic and perineal pain: Secondary | ICD-10-CM

## 2021-03-20 DIAGNOSIS — T8332XS Displacement of intrauterine contraceptive device, sequela: Secondary | ICD-10-CM

## 2021-03-20 NOTE — Progress Notes (Signed)
  History of Present Illness:  Madison Dixon is a 25 y.o. that had a Mirena IUD placed approximately 5 weeks ago. Since that time, she states that she has had no pain and irreg bleeding, until today where she woke w severe pain and heavy bleeding including clots.  Also dizziness.  She has used IUD in past without these side effects.  She has had this IUD placed after a D&C for miscarriage.  PMHx: She  has a past medical history of Anxiety, Asthma, Bipolar disorder (HCC), Chronic pelvic pain in female (09/09/2016), Depression, Endometriosis determined by laparoscopy (09/09/2016), Ovarian cyst, Pilonidal cyst, and PONV (postoperative nausea and vomiting). Also,  has a past surgical history that includes Pilonidal cyst excision (N/A, 04/10/2015); Colonoscopy with esophagogastroduodenoscopy (egd); laparoscopy (N/A, 09/09/2016); and Dilation and evacuation (N/A, 01/13/2021)., family history includes GER disease in her father; Hyperlipidemia in her father.,  reports that she has never smoked. She has never used smokeless tobacco. She reports previous alcohol use. She reports previous drug use. Drug: Marijuana. Current Meds  Medication Sig  . ibuprofen (ADVIL) 600 MG tablet Take 1 tablet (600 mg total) by mouth every 6 (six) hours as needed.  Marland Kitchen levonorgestrel (MIRENA) 20 MCG/DAY IUD 1 each by Intrauterine route once.  .  Also, is allergic to aripiprazole and vicodin [hydrocodone-acetaminophen]..  Review of Systems  All other systems reviewed and are negative.   Physical Exam:  BP 120/80   Wt 159 lb (72.1 kg)   LMP 03/18/2021   Breastfeeding Unknown   BMI 29.08 kg/m  Body mass index is 29.08 kg/m. Constitutional: Well nourished, well developed female in no acute distress.  Abdomen: diffusely mildly tender to palpation, non distended, and no masses, hernias Neuro: Grossly intact Psych:  Normal mood and affect.    Pelvic exam:  Two IUD strings present seen coming from the cervical os. EGBUS,  vaginal vault and cervix: within normal limits Tender exam Anteverted uterus  Assessment:  1. Pelvic pain 2. Abnormal uterine bleeding - US PELVIC COMPLETE WITH TRANSVAGINAL; Future 3. IUD strings present  Will assess for malposition of IUD If normal, then plan hormonal transition and monitor for resolution of these sx's Overlap w Provera or OCP as an option Prior use of IUD w good results indicates she should be able to cont w this as her contraception choice  A total of 21 minutes were spent face-to-face with the patient as well as preparation, review, communication, and documentation during this encounter.   Annamarie Major, MD, [facility], [facility] 03/20/2021  9:59 AM

## 2021-03-20 NOTE — Progress Notes (Signed)
  History of Present Illness:  Madison Dixon is a 25 y.o. that had a Mirena IUD placed approximately 2 months ago. Since that time, she states that has had pain and bleeding w clots; see note from earlier today.  She had [place] and IUD slightly malpositioned.  She is not sexually active currently.  Has used IUD in past without these effects.  The following portions of the patient's history were reviewed and updated as appropriate: allergies, current medications, past family history, past medical history, past social history, past surgical history and problem list.  Patient Active Problem List   Diagnosis Date Noted  . Missed abortion   . Inverted nipple 02/07/2020  . Chronic pelvic pain in female 09/09/2016  . Endometriosis determined by laparoscopy 09/09/2016  . Allergic rhinitis 05/10/2008   Medications:  Current Outpatient Medications on File Prior to Visit  Medication Sig Dispense Refill  . ibuprofen (ADVIL) 600 MG tablet Take 1 tablet (600 mg total) by mouth every 6 (six) hours as needed. 60 tablet 3  . levonorgestrel (MIRENA) 20 MCG/DAY IUD 1 each by Intrauterine route once.    Marland Kitchen buPROPion (WELLBUTRIN XL) 150 MG 24 hr tablet Take 1 tablet (150 mg total) by mouth daily. (Patient not taking: Reported on 03/20/2021) 30 tablet 2   No current facility-administered medications on file prior to visit.   Allergies: is allergic to aripiprazole and vicodin [hydrocodone-acetaminophen].  Physical Exam:  BP 120/80   Wt 159 lb (72.1 kg)   LMP 03/18/2021   Breastfeeding Unknown   BMI 29.08 kg/m  Body mass index is 29.08 kg/m. Constitutional: Well nourished, well developed female in no acute distress.  Abdomen: diffusely non tender to palpation, non distended, and no masses, hernias Neuro: Grossly intact Psych:  Normal mood and affect.    Pelvic exam:  Two IUD strings present seen coming from the cervical os. EGBUS, vaginal vault and cervix: within normal limits  IUD Removal Strings of  IUD identified and grasped.  IUD removed without problem.  Pt tolerated this well.  IUD noted to be intact.  Assessment: IUD Removal  Plan: IUD removed and plan for contraception is abstinence. She is not currently sexually active Will allow hormone free environment for next several weeks and see how periods resume. She was amenable to this plan.  Annamarie Major, M.D. 03/20/2021 1:25 PM

## 2021-03-25 ENCOUNTER — Ambulatory Visit: Payer: Medicaid Other | Admitting: Obstetrics and Gynecology

## 2021-04-01 ENCOUNTER — Ambulatory Visit (INDEPENDENT_AMBULATORY_CARE_PROVIDER_SITE_OTHER): Payer: Medicaid Other | Admitting: Obstetrics and Gynecology

## 2021-04-01 ENCOUNTER — Encounter: Payer: Self-pay | Admitting: Obstetrics and Gynecology

## 2021-04-01 ENCOUNTER — Other Ambulatory Visit: Payer: Self-pay

## 2021-04-01 VITALS — Wt 157.0 lb

## 2021-04-01 DIAGNOSIS — R350 Frequency of micturition: Secondary | ICD-10-CM

## 2021-04-01 DIAGNOSIS — N898 Other specified noninflammatory disorders of vagina: Secondary | ICD-10-CM

## 2021-04-01 DIAGNOSIS — Z113 Encounter for screening for infections with a predominantly sexual mode of transmission: Secondary | ICD-10-CM

## 2021-04-01 LAB — POCT URINALYSIS DIPSTICK
Bilirubin, UA: NEGATIVE
Blood, UA: NEGATIVE
Glucose, UA: NEGATIVE
Ketones, UA: NEGATIVE
Nitrite, UA: NEGATIVE
Protein, UA: NEGATIVE
Spec Grav, UA: 1.02 (ref 1.010–1.025)
Urobilinogen, UA: 0.2 E.U./dL
pH, UA: 5 (ref 5.0–8.0)

## 2021-04-01 NOTE — Patient Instructions (Signed)
.  amsge

## 2021-04-01 NOTE — Progress Notes (Signed)
Obstetrics & Gynecology Office Visit   Chief Complaint:  Chief Complaint  Patient presents with   Follow-up    IUD removed 03/20/21 by [facility], requesting STD testing, white discharge with odor, vaginal itching, urinary frequency with strong odor, discuss OCP. RM 5    History of Present Illness: Patient is a 25 y.o. G2P1011 presents for STD testing.  The patient has not noted intermenstrual spotting,  has not experienced postcoital bleeding, and does report increased vaginal discharge.  There is not a history of prior sexually transmitted infection(s).     The patient is sexually active . She currently uses  abstinence  for contraception. Recently underwent IUD removal.    Review of Systems: Review of Systems  Constitutional: Negative.   Gastrointestinal:  Negative for abdominal pain.  Genitourinary: Negative.   Skin: Negative.     Past Medical History:  Past Medical History:  Diagnosis Date   Anxiety    Asthma    sports induced   Bipolar disorder (HCC)    Chronic pelvic pain in female 09/09/2016   Depression    Endometriosis determined by laparoscopy 09/09/2016   Ovarian cyst    Pilonidal cyst    PONV (postoperative nausea and vomiting)     Past Surgical History:  Past Surgical History:  Procedure Laterality Date   COLONOSCOPY WITH ESOPHAGOGASTRODUODENOSCOPY (EGD)     DILATION AND EVACUATION N/A 01/13/2021   Procedure: Suction D&C;  Surgeon: Vena Austria, MD;  Location: [facility];  Service: Gynecology;  Laterality: N/A;   LAPAROSCOPY N/A 09/09/2016   Procedure: LAPAROSCOPY DIAGNOSTIC, FULGERATION OF ENDOMETRIOSIS;  Surgeon: Conard Novak, MD;  Location: [facility];  Service: Gynecology;  Laterality: N/A;   PILONIDAL CYST EXCISION N/A 04/10/2015   Procedure: CYST EXCISION PILONIDAL EXTENSIVE;  Surgeon: Kieth Brightly, MD;  Location: [facility];  Service: General;  Laterality: N/A;    Gynecologic History: Patient's last menstrual period was  03/18/2021.  Obstetric History: G2P1011  Family History:  Family History  Problem Relation Age of Onset   Hyperlipidemia Father    GER disease Father     Social History:  Social History   Socioeconomic History   Marital status: Single    Spouse name: Brett Canales   Number of children: Not on file   Years of education: Not on file   Highest education level: Not on file  Occupational History   Not on file  Tobacco Use   Smoking status: Never Smoker   Smokeless tobacco: Never Used  Vaping Use   Vaping Use: Never used  Substance and Sexual Activity   Alcohol use: Not Currently    Alcohol/week: 0.0 standard drinks    Comment: drinks on friday nights   Drug use: Not Currently    Types: Marijuana   Sexual activity: Yes    Birth control/protection: None  Other Topics Concern   Not on file  Social History Narrative   ** Merged History Encounter **       Social Determinants of Health   Financial Resource Strain: Not on file  Food Insecurity: Not on file  Transportation Needs: Not on file  Physical Activity: Not on file  Stress: Not on file  Social Connections: Not on file  Intimate Partner Violence: Not on file    Allergies:  Allergies  Allergen Reactions   Aripiprazole Nausea And Vomiting   Vicodin [Hydrocodone-Acetaminophen] Nausea And Vomiting    Medications: Prior to Admission medications   Medication Sig Start Date End Date  Taking? Authorizing Provider  buPROPion (WELLBUTRIN XL) 150 MG 24 hr tablet Take 1 tablet (150 mg total) by mouth daily. Patient not taking: Reported on 03/20/2021 01/22/21   Vena Austria, MD  ibuprofen (ADVIL) 600 MG tablet Take 1 tablet (600 mg total) by mouth every 6 (six) hours as needed. 01/13/21   Vena Austria, MD  levonorgestrel (MIRENA) 20 MCG/DAY IUD 1 each by Intrauterine route once.    [provider]    Physical Exam Vitals: There were no vitals filed for this visit. Patient's last menstrual period was  03/18/2021.  General: NAD, well nourished, appears stated age HEENT: normocephalic, anicteric Pulmonary: No increased work of breathing Genitourinary:  External: Normal external female genitalia.  Normal urethral meatus, normal  Bartholin's and Skene's glands.    Vagina: Normal vaginal mucosa, no evidence of prolapse.    Cervix: Grossly normal in appearance, no bleeding  Uterus: Non-enlarged, mobile, normal contour.  No CMT  Adnexa: ovaries non-enlarged, no adnexal masses  Rectal: deferred  Lymphatic: no evidence of inguinal lymphadenopathy Extremities: no edema, erythema, or tenderness Neurologic: Grossly intact Psychiatric: mood appropriate, affect full  Female chaperone present for pelvic  portions of the physical exam  Assessment: 25 y.o. W4R1540 presenting for vaginitis symptoms and testing  Plan: Problem List Items Addressed This Visit   None    Visit Diagnoses     Frequency of urination    -  Primary   Relevant Orders   POCT Urinalysis Dipstick (Completed)      1) Vaginitis symptoms - nuswab plus sent.  Treatment dependent on results.  2) Return in about 1 year (around 04/01/2022) for annual.   Vena Austria, MD, [facility], [facility] 04/01/2021, 11:29 AM

## 2021-04-05 LAB — NUSWAB VAGINITIS PLUS (VG+)
Candida albicans, NAA: NEGATIVE
Candida glabrata, NAA: NEGATIVE
Chlamydia trachomatis, NAA: NEGATIVE
Neisseria gonorrhoeae, NAA: NEGATIVE
Trich vag by NAA: NEGATIVE

## 2021-04-15 ENCOUNTER — Ambulatory Visit (INDEPENDENT_AMBULATORY_CARE_PROVIDER_SITE_OTHER): Payer: Medicaid Other | Admitting: Obstetrics and Gynecology

## 2021-04-15 ENCOUNTER — Other Ambulatory Visit: Payer: Self-pay

## 2021-04-15 ENCOUNTER — Encounter: Payer: Self-pay | Admitting: Obstetrics and Gynecology

## 2021-04-15 VITALS — BP 108/70 | Ht 62.0 in | Wt 154.8 lb

## 2021-04-15 DIAGNOSIS — Z3490 Encounter for supervision of normal pregnancy, unspecified, unspecified trimester: Secondary | ICD-10-CM

## 2021-04-15 DIAGNOSIS — Z30011 Encounter for initial prescription of contraceptive pills: Secondary | ICD-10-CM | POA: Diagnosis not present

## 2021-04-15 DIAGNOSIS — N920 Excessive and frequent menstruation with regular cycle: Secondary | ICD-10-CM

## 2021-04-15 LAB — POCT URINE PREGNANCY: Preg Test, Ur: NEGATIVE

## 2021-04-15 MED ORDER — NORETHIN-ETH ESTRAD-FE BIPHAS 1 MG-10 MCG / 10 MCG PO TABS: 1.0000 | ORAL_TABLET | Freq: Every day | ORAL | 11 refills | Status: DC

## 2021-04-15 NOTE — Progress Notes (Signed)
Patient ID: Madison Dixon, female   DOB: 12/28/1995, 25 y.o.   MRN: [id number]  Reason for Consult: Gynecologic Exam   Referred by Margaretann Loveless, P*  Subjective:     HPI:  Madison Dixon is a 25 y.o. female.  She presents today for a problem visit.  She reports that last week she began having positive pregnancy test at home.  She reports that she was taking a daily test and the line was initially dark but then became light.  She reports that she has been having symptoms of pregnancy.   In March the patient had a missed abortion which was treated by a D&C on January 13, 2021.  After that procedure, on January 22 2021, she had an IUD which was inserted.  She had the IUD removed on 03/20/2021 because of heavy bleeding that she is having with the IUD in place.  She did not start a backup birth control method and since that time has had unprotected intercourse.  She however does not wish to be pregnant.  She has birth control pills (lo loestrin) at her house which she was planning on starting when her next menstrual cycle began.  Gynecological History  Patient's last menstrual period was 03/20/2021.  OB History     Gravida  2   Para  1   Term  1   Preterm  0   AB  1   Living  1      SAB  1   IAB  0   Ectopic  0   Multiple      Live Births           Obstetric Comments  1st Menstrual Cycle:  11           Past Medical History:  Diagnosis Date   Anxiety    Asthma    sports induced   Bipolar disorder (HCC)    Chronic pelvic pain in female 09/09/2016   Depression    Endometriosis determined by laparoscopy 09/09/2016   Ovarian cyst    Pilonidal cyst    PONV (postoperative nausea and vomiting)    Family History  Problem Relation Age of Onset   Hyperlipidemia Father    GER disease Father    Past Surgical History:  Procedure Laterality Date   COLONOSCOPY WITH ESOPHAGOGASTRODUODENOSCOPY (EGD)     DILATION AND EVACUATION N/A 01/13/2021   Procedure:  Suction D&C;  Surgeon: Vena Austria, MD;  Location: [facility];  Service: Gynecology;  Laterality: N/A;   LAPAROSCOPY N/A 09/09/2016   Procedure: LAPAROSCOPY DIAGNOSTIC, FULGERATION OF ENDOMETRIOSIS;  Surgeon: Conard Novak, MD;  Location: [facility];  Service: Gynecology;  Laterality: N/A;   PILONIDAL CYST EXCISION N/A 04/10/2015   Procedure: CYST EXCISION PILONIDAL EXTENSIVE;  Surgeon: Kieth Brightly, MD;  Location: [facility];  Service: General;  Laterality: N/A;    Short Social History:  Social History   Tobacco Use   Smoking status: Never   Smokeless tobacco: Never  Substance Use Topics   Alcohol use: Not Currently    Alcohol/week: 0.0 standard drinks    Comment: drinks on friday nights    Allergies  Allergen Reactions   Aripiprazole Nausea And Vomiting   Vicodin [Hydrocodone-Acetaminophen] Nausea And Vomiting    Current Outpatient Medications  Medication Sig Dispense Refill   ibuprofen (ADVIL) 600 MG tablet Take 1 tablet (600 mg total) by mouth every 6 (six) hours as needed. 60 tablet 3   Norethindrone-Ethinyl Estradiol-Fe  Biphas (LO LOESTRIN FE) 1 MG-10 MCG / 10 MCG tablet Take 1 tablet by mouth daily. 28 tablet 11   buPROPion (WELLBUTRIN XL) 150 MG 24 hr tablet Take 1 tablet (150 mg total) by mouth daily. (Patient not taking: No sig reported) 30 tablet 2   No current facility-administered medications for this visit.    Review of Systems  Constitutional: Negative for chills, fatigue, fever and unexpected weight change.  HENT: Negative for trouble swallowing.  Eyes: Negative for loss of vision.  Respiratory: Negative for cough, shortness of breath and wheezing.  Cardiovascular: Negative for chest pain, leg swelling, palpitations and syncope.  GI: Negative for abdominal pain, blood in stool, diarrhea, nausea and vomiting.  GU: Negative for difficulty urinating, dysuria, frequency and hematuria.  Musculoskeletal: Negative for back pain, leg pain and joint pain.   Skin: Negative for rash.  Neurological: Negative for dizziness, headaches, light-headedness, numbness and seizures.  Psychiatric: Negative for behavioral problem, confusion, depressed mood and sleep disturbance.       Objective:  Objective   Vitals:   04/15/21 1025  BP: 108/70  Weight: 154 lb 12.8 oz (70.2 kg)  Height: 5\' 2"  (1.575 m)   Body mass index is 28.31 kg/m.  Physical Exam Vitals and nursing note reviewed. Exam conducted with a chaperone present.  Constitutional:      Appearance: Normal appearance.  HENT:     Head: Normocephalic and atraumatic.  Eyes:     Extraocular Movements: Extraocular movements intact.     Pupils: Pupils are equal, round, and reactive to light.  Cardiovascular:     Rate and Rhythm: Normal rate and regular rhythm.  Pulmonary:     Effort: Pulmonary effort is normal.     Breath sounds: Normal breath sounds.  Abdominal:     General: Abdomen is flat.     Palpations: Abdomen is soft.  Musculoskeletal:     Cervical back: Normal range of motion.  Skin:    General: Skin is warm and dry.  Neurological:     General: No focal deficit present.     Mental Status: She is alert and oriented to person, place, and time.  Psychiatric:        Behavior: Behavior normal.        Thought Content: Thought content normal.        Judgment: Judgment normal.    Assessment/Plan:    25 year old  with possible early stage pregnancy Pregnancy test today in office is negative. Will obtain beta-hCG.  Discussed that the patient can start her oral contraceptive pill today using the quick start method.  This would involve starting her OCP today and using backup birth control and contraception for 2 weeks as the birth control becomes effective.  After 2 weeks of taking the OCP would  repeat a pregnancy test at home.  If her home pregnancy test is negative, that she continue to continue to take the birth control pill in a normal fashion.  Prescription for low Loestrin  was sent to the pharmacy at the patient's request.  More than 20 minutes were spent face to face with the patient in the room, reviewing the medical record, labs and images, and coordinating care for the patient. The plan of management was discussed in detail and counseling was provided.   25 MD [facility], [facility] 04/15/2021 12:01 PM

## 2021-04-15 NOTE — Patient Instructions (Signed)
Oral Contraception Use Oral contraceptive pills (OCPs) are medicines that prevent pregnancy. OCPs work by: Preventing the ovaries from releasing eggs. Thickening mucus in the lower part of the uterus (cervix). This prevents sperm from entering the uterus. Thinning the lining of the uterus (endometrium). This prevents a fertilized egg from attaching to the endometrium. Discuss possible side effects of OCPs with your health care provider. It cantake 2-3 months for your body to adjust to changes in hormone levels. What are the risks? OCPs can sometimes cause side effects, such as: Headache. Depression. Trouble sleeping. Nausea and vomiting. Breast tenderness. Irregular bleeding or spotting during the first several months. Bloating or fluid retention. Increase in blood pressure. OCPs with estrogen and progestins may slightly increase the risk of: Blood clots. Heart attack. Stroke. How to take OCPs Follow instructions from your health care provider about how to take your first cycle of OCPs. There are 2 types of OCPs. The first, combination OCPs, have both estrogen and progestins. The second, progestin-only pills, have only progestin. For combination OCPs, you may start the pill: On day 1 of your menstrual period. On the first Sunday after your period starts, or on the day you get your prescription. At any time of your cycle. If you start taking the pill within 5 days after the start of your period, you will not need a backup form of birth control, such as condoms. If you start at any other time of your menstrual cycle, you will need to use a backup form of birth control. For progestin-only OCPs: Ideally, you can start taking the pill on the first day of your menstrual period, but you can start it on any other day too. These pills will protect you from pregnancy after taking it for 2 days (48 hours). You can stop using a backup form of birth control after that time. It is important that you  take this pill at the same time every day. Even taking it 3 hours late can increase the risk of pregnancy. No matter which day you start the OCP, you will always start a new pack on that same day of the week. Have an extra pack of OCPs and a backup contraceptivemethod available in case you miss some pills or lose your OCP pack. Missed doses Follow instructions from your health care provider for missed doses. Information about missed doses can also be found in the patient information sheet that comes with your pack of pills. In general, for combined OCPs: If you forget to take the pill for 1 day, take it as soon as you can. This may mean taking 2 pills on the same day and at the same time. Take the next day's pill at the regular time. If you forget to take the pill for 2 days in a row, take 2 tablets on the day you remember and 2 tablets on the following day. A backup form of birth control should be used for 7 days after you are back on schedule. If you forget to take the pill for 3 days in a row, call your health care provider for directions on when to restart taking your pills. Do not take the missed pills. A backup form of birth control will be needed for 7 days once you restart your pills. If you use a pack that contains inactive pills and you miss 1 or more of the inactive pills, you do not need to take the missed doses. Skip them and start the new pack on   the regular day. For progestin-only OCPs: If your dose is 3 hours or more late, or if you miss 1 or more doses, take 1 missed pill as soon as you can. If you miss one or more doses, you must use a backup form of birth control. Some brands of progestin-only pills recommend using a backup form of birth control for 48 hours after a missed or late dose while others recommend 7 days. If you are not sure what to do, call your health care provider or check the patient information sheet that came with your pills. Follow these instructions at home: Do not  use any products that contain nicotine or tobacco. These include cigarettes, chewing tobacco, or vaping devices, such as e-cigarettes. If you need help quitting, ask your health care provider. Always use a condom to protect against STIs (sexually transmitted infections). Oral contraception pills do not protect against STIs. Use a calendar to mark the days of your menstrual period. Read the information sheet and directions that came with your OCP. Talk to your health care provider if you have questions. Contact a health care provider if: You develop nausea and vomiting. You have abnormal vaginal discharge or bleeding. You develop a rash. You miss your menstrual period. Depending on the type of OCP you are taking, this may be a sign of pregnancy. You are losing your hair. You need treatment for mood swings or depression. You get dizzy when taking the OCP. You develop acne after taking the OCP. You become pregnant or think you may be pregnant. You have diarrhea, constipation, and abdominal pain or cramps. You are not sure what to do after missing pills. Get help right away if: You develop chest pain. You develop shortness of breath. You have an uncontrolled or severe headache. You develop numbness or slurred speech. You develop vision or speech problems. You develop pain, redness, and swelling in your legs. You develop weakness or numbness in your arms or legs. These symptoms may represent a serious problem that is an emergency. Do not wait to see if the symptoms will go away. Get medical help right away. Call your local emergency services (911 in the [place] Do not drive yourself to the hospital. Summary Oral contraceptive pills (OCPs) are medicines that you take to prevent pregnancy. OCPs do not prevent sexually transmitted infections (STIs). Always use a condom to protect against STIs. When you start an OCP, be aware that it can take 2-3 months for your body to adjust to changes in hormone  levels. Read all the information and directions that come with your OCP. This information is not intended to replace advice given to you by your health care provider. Make sure you discuss any questions you have with your healthcare provider. Document Revised: 06/19/2020 Document Reviewed: 06/19/2020 Elsevier Patient Education  2022 [employer]  

## 2021-04-16 LAB — BETA HCG QUANT (REF LAB): hCG Quant: 1 m[IU]/mL

## 2021-05-05 ENCOUNTER — Other Ambulatory Visit: Payer: Self-pay | Admitting: Obstetrics

## 2021-05-05 DIAGNOSIS — Z30011 Encounter for initial prescription of contraceptive pills: Secondary | ICD-10-CM

## 2021-05-05 MED ORDER — NORETHIN ACE-ETH ESTRAD-FE 1-20 MG-MCG PO TABS: 1.0000 | ORAL_TABLET | Freq: Every day | ORAL | 11 refills | Status: DC

## 2021-05-05 NOTE — Telephone Encounter (Signed)
Can you switch her BC pills?

## 2021-05-16 DIAGNOSIS — Z20822 Contact with and (suspected) exposure to covid-19: Secondary | ICD-10-CM | POA: Diagnosis not present

## 2021-07-02 ENCOUNTER — Ambulatory Visit: Payer: Medicaid Other | Admitting: Obstetrics and Gynecology

## 2021-07-29 ENCOUNTER — Other Ambulatory Visit: Payer: Self-pay

## 2021-07-29 ENCOUNTER — Emergency Department
Admission: EM | Admit: 2021-07-29 | Discharge: 2021-07-29 | Disposition: A | Discharge status: Home / Self Care | Payer: Medicaid Other | Attending: Emergency Medicine | Admitting: Emergency Medicine

## 2021-07-29 ENCOUNTER — Emergency Department: Payer: Medicaid Other

## 2021-07-29 ENCOUNTER — Ambulatory Visit: Payer: Self-pay | Admitting: *Deleted

## 2021-07-29 DIAGNOSIS — J45909 Unspecified asthma, uncomplicated: Secondary | ICD-10-CM | POA: Insufficient documentation

## 2021-07-29 DIAGNOSIS — N3000 Acute cystitis without hematuria: Secondary | ICD-10-CM | POA: Insufficient documentation

## 2021-07-29 DIAGNOSIS — W01198A Fall on same level from slipping, tripping and stumbling with subsequent striking against other object, initial encounter: Secondary | ICD-10-CM | POA: Insufficient documentation

## 2021-07-29 DIAGNOSIS — R42 Dizziness and giddiness: Secondary | ICD-10-CM | POA: Diagnosis not present

## 2021-07-29 LAB — BASIC METABOLIC PANEL
Anion gap: 4 — ABNORMAL LOW (ref 5–15)
BUN: 11 mg/dL (ref 6–20)
CO2: 26 mmol/L (ref 22–32)
Calcium: 9.4 mg/dL (ref 8.9–10.3)
Chloride: 107 mmol/L (ref 98–111)
Creatinine, Ser: 0.74 mg/dL (ref 0.44–1.00)
GFR, Estimated: >60 mL/min (ref >60)
Glucose, Bld: 109 mg/dL — ABNORMAL HIGH (ref 70–99)
Potassium: 3.7 mmol/L (ref 3.5–5.1)
Sodium: 137 mmol/L (ref 135–145)

## 2021-07-29 LAB — URINALYSIS, COMPLETE (UACMP) WITH MICROSCOPIC
Bilirubin Urine: NEGATIVE
Glucose, UA: NEGATIVE mg/dL
Hgb urine dipstick: NEGATIVE
Ketones, ur: NEGATIVE mg/dL
Nitrite: NEGATIVE
Protein, ur: NEGATIVE mg/dL
Specific Gravity, Urine: 1.009 (ref 1.005–1.030)
pH: 7 (ref 5.0–8.0)

## 2021-07-29 LAB — CBC
HCT: 38 % (ref 36.0–46.0)
Hemoglobin: 12.9 g/dL (ref 12.0–15.0)
MCH: 31.3 pg (ref 26.0–34.0)
MCHC: 33.9 g/dL (ref 30.0–36.0)
MCV: 92.2 fL (ref 80.0–100.0)
Platelets: 301 10*3/uL (ref 150–400)
RBC: 4.12 MIL/uL (ref 3.87–5.11)
RDW: 11.9 % (ref 11.5–15.5)
WBC: 6 10*3/uL (ref 4.0–10.5)
nRBC: 0 % (ref 0.0–0.2)

## 2021-07-29 LAB — PREGNANCY, URINE: Preg Test, Ur: NEGATIVE

## 2021-07-29 MED ORDER — CEPHALEXIN 500 MG PO CAPS: 500.0000 mg | ORAL_CAPSULE | Freq: Four times a day (QID) | ORAL | 0 refills | Status: AC

## 2021-07-29 MED ORDER — MECLIZINE HCL 25 MG PO TABS: 25.0000 mg | ORAL_TABLET | Freq: Three times a day (TID) | ORAL | 0 refills | Status: DC | PRN

## 2021-07-29 NOTE — Discharge Instructions (Signed)
Take Keflex 4 times daily for the next 7 days. You can take 25 mg of meclizine up to 3 times daily.

## 2021-07-29 NOTE — Telephone Encounter (Signed)
Pt reports "Leaned over to pick something up and got dizzy." Occurred yesterday. States "Spinning" today, with positional changes, turning head fast, sitting to standing. Also reports nausea. States "Feels like I may pass out sometimes spinning so."  Denies any congestion, no recent cold , symptoms, no earache. Advised UC, states has driver.

## 2021-07-29 NOTE — ED Triage Notes (Signed)
First Nurse Note:  C/O dizziness x 2-3 weeks.  Reports hitting right side of head several weeks ago and "just hasn't felt right since'.  AAOx3.  Skin warm and dry. Ambulates with easy and steady gait. NAD

## 2021-07-29 NOTE — Telephone Encounter (Signed)
Reason for Disposition  [1] MODERATE dizziness (e.g., vertigo; feels very unsteady, interferes with normal activities) AND [2] has NOT been evaluated by physician for this  Answer Assessment - Initial Assessment Questions 1. DESCRIPTION: "Describe your dizziness."     Spinning 2. VERTIGO: "Do you feel like either you or the room is spinning or tilting?"      vertigo 3. LIGHTHEADED: "Do you feel lightheaded?" (e.g., somewhat faint, woozy, weak upon standing)     Nausea 4. SEVERITY: "How bad is it?"  "Can you walk?"   - MILD: Feels slightly dizzy and unsteady, but is walking normally.   - MODERATE: Feels unsteady when walking, but not falling; interferes with normal activities (e.g., school, work).   - SEVERE: Unable to walk without falling, or requires assistance to walk without falling.     Feels like I may pass out 5. ONSET:  "When did the dizziness begin?"     Yesterday 6. AGGRAVATING FACTORS: "Does anything make it worse?" (e.g., standing, change in head position)     Positional change, turning head 7. CAUSE: "What do you think is causing the dizziness?"     Unsure 8. RECURRENT SYMPTOM: "Have you had dizziness before?" If Yes, ask: "When was the last time?" "What happened that time?"     No 9. OTHER SYMPTOMS: "Do you have any other symptoms?" (e.g., headache, weakness, numbness, vomiting, earache)     Nausea. Larey Seat a few weeks ago, hit head, no headache presently  Protocols used: Dizziness - Vertigo-A-AH

## 2021-07-29 NOTE — ED Triage Notes (Addendum)
Pt here with dizziness after hitting her head 3 weeks ago. Pt states that 3 weeks ago she fell and hit the back of her head. Pt states she bent down today and she almost passed out. Pt also c/o nausea. Pt having headaches and light sensitivity. Pt denies V/D.

## 2021-07-29 NOTE — ED Provider Notes (Signed)
[facility]-EMERGENCY DEPARTMENT  ____________________________________________  Time seen: Approximately 6:22 PM  I have reviewed the triage vital signs and the nursing notes.   HISTORY  Chief Complaint Dizziness   Historian Patient     HPI Madison Dixon is a 25 y.o. female presents to the emergency department with dizziness that started today.  Patient reports that she fell while playing with her daughter 3 weeks ago and hit the back of her head.  Patient states that for a couple of days, she did not feel like herself after the fall but then states that she returned to normal.  She states that dizziness seem to come out of the blue today.  She had some light sensitivity and headache.  No numbness or tingling in upper and lower extremities.  No new falls or mechanisms of trauma.  Patient is unsure about the possibility of pregnancy and cannot remember her last period.  No chest pain, chest tightness or abdominal pain.   Past Medical History:  Diagnosis Date   Anxiety    Asthma    sports induced   Bipolar disorder (HCC)    Chronic pelvic pain in female 09/09/2016   Depression    Endometriosis determined by laparoscopy 09/09/2016   Ovarian cyst    Pilonidal cyst    PONV (postoperative nausea and vomiting)      Immunizations up to date:  Yes.     Past Medical History:  Diagnosis Date   Anxiety    Asthma    sports induced   Bipolar disorder (HCC)    Chronic pelvic pain in female 09/09/2016   Depression    Endometriosis determined by laparoscopy 09/09/2016   Ovarian cyst    Pilonidal cyst    PONV (postoperative nausea and vomiting)     Patient Active Problem List   Diagnosis Date Noted   Missed abortion    Inverted nipple 02/07/2020   Chronic pelvic pain in female 09/09/2016   Endometriosis determined by laparoscopy 09/09/2016   Allergic rhinitis 05/10/2008    Past Surgical History:  Procedure Laterality Date   COLONOSCOPY WITH ESOPHAGOGASTRODUODENOSCOPY  (EGD)     DILATION AND EVACUATION N/A 01/13/2021   Procedure: Suction D&C;  Surgeon: Vena Austria, MD;  Location: [facility] ORS;  Service: Gynecology;  Laterality: N/A;   LAPAROSCOPY N/A 09/09/2016   Procedure: LAPAROSCOPY DIAGNOSTIC, FULGERATION OF ENDOMETRIOSIS;  Surgeon: Conard Novak, MD;  Location: [facility] ORS;  Service: Gynecology;  Laterality: N/A;   PILONIDAL CYST EXCISION N/A 04/10/2015   Procedure: CYST EXCISION PILONIDAL EXTENSIVE;  Surgeon: Kieth Brightly, MD;  Location: [facility] ORS;  Service: General;  Laterality: N/A;    Prior to Admission medications   Medication Sig Start Date End Date Taking? Authorizing Provider  cephALEXin (KEFLEX) 500 MG capsule Take 1 capsule (500 mg total) by mouth 4 (four) times daily for 7 days. 07/29/21 08/05/21 Yes Pia Mau M, PA-C  meclizine (ANTIVERT) 25 MG tablet Take 1 tablet (25 mg total) by mouth 3 (three) times daily as needed for dizziness. 07/29/21  Yes Pia Mau M, PA-C  buPROPion (WELLBUTRIN XL) 150 MG 24 hr tablet Take 1 tablet (150 mg total) by mouth daily. Patient not taking: No sig reported 01/22/21   Vena Austria, MD  ibuprofen (ADVIL) 600 MG tablet Take 1 tablet (600 mg total) by mouth every 6 (six) hours as needed. 01/13/21   Vena Austria, MD  norethindrone-ethinyl estradiol-FE (JUNEL FE 1/20) 1-20 MG-MCG tablet Take 1 tablet by mouth daily. 05/05/21  Mirna Mires, CNM    Allergies Aripiprazole and Vicodin [hydrocodone-acetaminophen]  Family History  Problem Relation Age of Onset   Hyperlipidemia Father    GER disease Father     Social History Social History   Tobacco Use   Smoking status: Never   Smokeless tobacco: Never  Vaping Use   Vaping Use: Never used  Substance Use Topics   Alcohol use: Not Currently    Alcohol/week: 0.0 standard drinks    Comment: drinks on friday nights   Drug use: Not Currently    Types: Marijuana     Review of Systems  Constitutional: No fever/chills Eyes:   No discharge ENT: No upper respiratory complaints. Respiratory: no cough. No SOB/ use of accessory muscles to breath Gastrointestinal:   No nausea, no vomiting.  No diarrhea.  No constipation. Musculoskeletal: Negative for musculoskeletal pain. Neuro: Patient has headache.  Skin: Negative for rash, abrasions, lacerations, ecchymosis.    ____________________________________________   PHYSICAL EXAM:  VITAL SIGNS: ED Triage Vitals  Enc Vitals Group     BP 07/29/21 1504 (!) 118/56     Pulse Rate 07/29/21 1504 66     Resp 07/29/21 1504 18     Temp 07/29/21 1504 98.2 F (36.8 C)     Temp Source 07/29/21 1504 Oral     SpO2 07/29/21 1504 100 %     Weight 07/29/21 1504 155 lb (70.3 kg)     Height 07/29/21 1504 5\' 2"  (1.575 m)     Head Circumference --      Peak Flow --      Pain Score 07/29/21 1504 2     Pain Loc --      Pain Edu? --      Excl. in GC? --      Constitutional: Alert and oriented. Well appearing and in no acute distress. Eyes: Conjunctivae are normal. PERRL. EOMI. Head: Atraumatic. ENT:      Nose: No congestion/rhinnorhea.      Mouth/Throat: Mucous membranes are moist.  Neck: No stridor.  No cervical spine tenderness to palpation. Cardiovascular: Normal rate, regular rhythm. Normal S1 and S2.  Good peripheral circulation. Respiratory: Normal respiratory effort without tachypnea or retractions. Lungs CTAB. Good air entry to the bases with no decreased or absent breath sounds Gastrointestinal: Bowel sounds x 4 quadrants. Soft and nontender to palpation. No guarding or rigidity. No distention. Musculoskeletal: Full range of motion to all extremities. No obvious deformities noted Neurologic:  Normal for age. No gross focal neurologic deficits are appreciated.  Skin:  Skin is warm, dry and intact. No rash noted. Psychiatric: Mood and affect are normal for age. Speech and behavior are normal.   ____________________________________________   LABS (all labs ordered  are listed, but only abnormal results are displayed)  Labs Reviewed  BASIC METABOLIC PANEL - Abnormal; Notable for the following components:      Result Value   Glucose, Bld 109 (*)    Anion gap 4 (*)    All other components within normal limits  URINALYSIS, COMPLETE (UACMP) WITH MICROSCOPIC - Abnormal; Notable for the following components:   Color, Urine YELLOW (*)    APPearance HAZY (*)    Leukocytes,Ua MODERATE (*)    Bacteria, UA MANY (*)    All other components within normal limits  CBC  PREGNANCY, URINE  CBG MONITORING, ED  POC URINE PREG, ED   ____________________________________________  EKG   ____________________________________________  RADIOLOGY 09/28/21, personally viewed and evaluated  these images (plain radiographs) as part of my medical decision making, as well as reviewing the written report by the radiologist.  CT HEAD WO CONTRAST  Result Date: 07/29/2021 CLINICAL DATA:  Dizziness for 2-3 weeks. EXAM: CT HEAD WITHOUT CONTRAST TECHNIQUE: Contiguous axial images were obtained from the base of the skull through the vertex without intravenous contrast. COMPARISON:  03/19/2006 FINDINGS: Brain: No evidence of acute infarction, hemorrhage, hydrocephalus, extra-axial collection or mass lesion/mass effect. Vascular: No hyperdense vessel or unexpected calcification. Skull: Normal. Negative for fracture or focal lesion. Sinuses/Orbits: Normal globes and orbits. Visualized sinuses are clear. Other: None. IMPRESSION: Normal unenhanced CT scan of the brain. Electronically Signed   By: Amie Portland M.D.   On: 07/29/2021 15:34    ____________________________________________    PROCEDURES  Procedure(s) performed:     Procedures     Medications - No data to display   ____________________________________________   INITIAL IMPRESSION / ASSESSMENT AND PLAN / ED COURSE  Pertinent labs & imaging results that were available during my care of the patient were  reviewed by me and considered in my medical decision making (see chart for details).      Assessment and Plan:  Dizziness UTI 25 year old female presents to the emergency department with dizziness that started today.  Vital signs are reassuring at triage.  Patient was alert, active and nontoxic-appearing.  CBC and BMP were within reference range.  Urinalysis showed moderate leuks and many bacteria.  When questioned, patient stated that she had had some increased urinary frequency and low back pain.  We will treat for UTI with Keflex 4 times daily for the next 7 days.  Also prescribed patient meclizine for dizziness.   ____________________________________________  FINAL CLINICAL IMPRESSION(S) / ED DIAGNOSES  Final diagnoses:  Dizziness  Acute cystitis without hematuria      NEW MEDICATIONS STARTED DURING THIS VISIT:  ED Discharge Orders          Ordered    cephALEXin (KEFLEX) 500 MG capsule  4 times daily        07/29/21 1857    meclizine (ANTIVERT) 25 MG tablet  3 times daily PRN        07/29/21 1857                This chart was dictated using voice recognition software/Dragon. Despite best efforts to proofread, errors can occur which can change the meaning. Any change was purely unintentional.     Orvil Feil, PA-C 07/29/21 2156    Merwyn Katos, MD 08/04/21 2100

## 2021-07-30 ENCOUNTER — Telehealth: Payer: Self-pay

## 2021-07-30 NOTE — Telephone Encounter (Signed)
Transition Care Management Unsuccessful Follow-up Telephone Call  Date of discharge and from where:  07/29/2021-[facility]  Attempts:  1st Attempt  Reason for unsuccessful TCM follow-up call:  Left voice message    

## 2021-07-31 NOTE — Telephone Encounter (Signed)
Transition Care Management Unsuccessful Follow-up Telephone Call  Date of discharge and from where:  07/29/2021 from [facility]  Attempts:  2nd Attempt  Reason for unsuccessful TCM follow-up call:  Left voice message    

## 2021-08-03 NOTE — Telephone Encounter (Signed)
Transition Care Management Unsuccessful Follow-up Telephone Call  Date of discharge and from where:  07/29/2021 from [facility]  Attempts:  3rd Attempt  Reason for unsuccessful TCM follow-up call:  Unable to reach patient    

## 2021-08-20 NOTE — Telephone Encounter (Signed)
Mirena rcvd/charged 01/22/21 

## 2021-08-31 ENCOUNTER — Ambulatory Visit: Payer: Medicaid Other | Admitting: Advanced Practice Midwife

## 2021-10-08 ENCOUNTER — Ambulatory Visit: Payer: Medicaid Other | Admitting: Family Medicine

## 2021-10-08 ENCOUNTER — Encounter: Payer: Self-pay | Admitting: Family Medicine

## 2021-10-08 ENCOUNTER — Ambulatory Visit: Payer: Medicaid Other

## 2021-10-08 ENCOUNTER — Other Ambulatory Visit: Payer: Self-pay

## 2021-10-08 DIAGNOSIS — Z113 Encounter for screening for infections with a predominantly sexual mode of transmission: Secondary | ICD-10-CM

## 2021-10-08 DIAGNOSIS — Z299 Encounter for prophylactic measures, unspecified: Secondary | ICD-10-CM

## 2021-10-08 LAB — WET PREP FOR TRICH, YEAST, CLUE
Clue Cell Exam: POSITIVE — AB
Trichomonas Exam: NEGATIVE

## 2021-10-08 MED ORDER — DOXYCYCLINE HYCLATE 100 MG PO TABS: 100.0000 mg | ORAL_TABLET | Freq: Two times a day (BID) | ORAL | 0 refills | Status: AC

## 2021-10-08 NOTE — Progress Notes (Signed)
[facility] Department  STI clinic/screening visit [address] [place] [address] [phone]  Subjective:  Madison Dixon is a 25 y.o. female being seen today for an STI screening visit. The patient reports they do have symptoms.  Patient reports that they do desire a pregnancy in the next year.   They reported they are not interested in discussing contraception today.    Patient's last menstrual period was 09/30/2021.   Patient has the following medical conditions:   Patient Active Problem List   Diagnosis Date Noted   Missed abortion    Inverted nipple 02/07/2020   Chronic pelvic pain in female 09/09/2016   Endometriosis determined by laparoscopy 09/09/2016   Allergic rhinitis 05/10/2008    Chief Complaint  Patient presents with   SEXUALLY TRANSMITTED DISEASE    Screening     HPI  Patient reports here for screening, reports s/sx   No previous HIV  Patient reports last pap was 09/2020   Screening for MPX risk: Does the patient have an unexplained rash? No Is the patient MSM? No Does the patient endorse multiple sex partners or anonymous sex partners? No Did the patient have close or sexual contact with a person diagnosed with MPX? No Has the patient traveled outside the [place] where MPX is endemic? No Is there a high clinical suspicion for MPX-- evidenced by one of the following No  -Unlikely to be chickenpox  -Lymphadenopathy  -Rash that present in same phase of evolution on any given body part See flowsheet for further details and programmatic requirements.    The following portions of the patient's history were reviewed and updated as appropriate: allergies, current medications, past medical history, past social history, past surgical history and problem list.  Objective:  There were no vitals filed for this visit.  Physical Exam Vitals and nursing note reviewed.  Constitutional:      Appearance: Normal appearance.  HENT:      Head: Normocephalic and atraumatic.     Mouth/Throat:     Mouth: Mucous membranes are moist.     Pharynx: Oropharynx is clear. No oropharyngeal exudate or posterior oropharyngeal erythema.  Pulmonary:     Effort: Pulmonary effort is normal.  Abdominal:     General: Abdomen is flat.     Palpations: There is no mass.     Tenderness: There is no abdominal tenderness. There is no rebound.  Genitourinary:    General: Normal vulva.     Exam position: Lithotomy position.     Pubic Area: No rash or pubic lice.      Labia:        Right: No rash or lesion.        Left: No rash or lesion.      Vagina: Normal. No vaginal discharge, erythema, bleeding or lesions.     Cervix: No cervical motion tenderness, discharge, friability, lesion or erythema.     Uterus: Normal.      Adnexa: Right adnexa normal and left adnexa normal.     Rectum: Normal.     Comments: External genitalia without, lice, nits, erythema, edema , lesions or inguinal adenopathy. Vagina with normal mucosa and thick discharge and pH > 4.  Cervix without visual lesions, uterus firm, mobile, non-tender, no masses, CMT adnexal fullness or tenderness. Cervix friable   Lymphadenopathy:     Head:     Right side of head: No preauricular or posterior auricular adenopathy.     Left side of  head: No preauricular or posterior auricular adenopathy.     Cervical: No cervical adenopathy.     Upper Body:     Right upper body: No supraclavicular or axillary adenopathy.     Left upper body: No supraclavicular or axillary adenopathy.     Lower Body: No right inguinal adenopathy. No left inguinal adenopathy.  Skin:    General: Skin is warm and dry.     Findings: No rash.  Neurological:     Mental Status: She is alert and oriented to person, place, and time.  Psychiatric:        Mood and Affect: Mood normal.        Behavior: Behavior normal.     Assessment and Plan:  Madison Dixon is a 25 y.o. female presenting to the [facility] Department for STI screening  1. Screening examination for venereal disease Patient accepted all screenings including wet prep, oral, vaginal CT/GC and bloodwork for HIV/RPR.  Patient meets criteria for HepB screening? No. Ordered?  Patient meets criteria for HepC screening? No. Ordered? No - does not meet criteria   Wet prep results + amine , + clue, few yeast    Treatment needed for possible chlamydia  Discussed time line for [facility] results and that patient will be called with positive results and encouraged patient to call if she had not heard in 2 weeks.  Counseled to return or seek care for continued or worsening symptoms Recommended condom use with all sex  Patient is currently using  no BCM   to prevent pregnancy.  - HIV [facility] - Syphilis Serology, [facility] - Chlamydia/Gonorrhea [facility] - WET PREP FOR TRICH, YEAST, CLUE - Chlamydia/Gonorrhea [facility]   2. Prophylactic measure D/t s/sx and + amine & clue, pt treated prophylatically for Chlamydia.  - doxycycline (VIBRA-TABS) 100 MG tablet; Take 1 tablet (100 mg total) by mouth 2 (two) times daily for 7 days.  Dispense: 14 tablet; Refill: 0    Return for as needed.  No future appointments.  Wendi Snipes, FNP

## 2021-10-22 ENCOUNTER — Telehealth: Payer: Self-pay | Admitting: Family Medicine

## 2021-10-22 NOTE — Telephone Encounter (Signed)
Patient specifically asked for [facility]

## 2021-10-22 NOTE — Telephone Encounter (Signed)
Call to patient, pt has concerns because of bleeding after sex and TR were negative for CT/GC.  Discussed with patient about rescreening make sure no infection and to check to see if yeast was still present.    Pt to schedule appointment 14 days after last dose of medication.     Wendi Snipes, FNP

## 2021-10-28 NOTE — Progress Notes (Signed)
Pcp, No   Chief Complaint  Patient presents with   Gynecologic Exam    Bleeding after intercourse; d/c like water; pain c intercourse; tested for STDs a few weeks ago and were negative; did say there is a spot on her cx,    HPI:      [name] is a 26 y.o. [id number] whose LMP was Patient's last menstrual period was 10/26/2021 (exact date)., presents today for bleeding and pain with sex since 8/22. Pain is vaginal and a stabbing pain, particularly when cx hit. She then has cramping pelvic pain after sex. No pelvic pain if not sex active. Hx of endometriosis confirmed on dx lap, did BC for sx mgmt in past (pills, depo, nexplanon, Mirena). Also has been having red bleeding during sex that resolves afterwards. Sx every time so pt not sex active now due to pain/embarrassment of bleeding. Last sex active over a month ago, using condoms. Had PC bleeding 12/21 but ended up being pregnant; s/p missed abortion with D&C 3/22.   Is also having excessive vag d/c without itching/irritation, odor. She saw [facility] and had neg STD testing 12/22; treated prophylactically for chlamydia at [facility] with doxy BID for 7 days. Wet prep confirmed BV but no BV tx given. Hasn't had sex since tx. No new partners. Neg pap 10/14/20. Provider said there was a "spot" on her cx.   Menses are monthly, lasting 3-5 days, mod flow, no BTB, mild dysmen, no meds needed. Was on OCPs but stopped 6/22.   Patient Active Problem List   Diagnosis Date Noted   BV (bacterial vaginosis) 10/29/2021   Missed abortion    Inverted nipple 02/07/2020   Chronic pelvic pain in female 09/09/2016   Endometriosis determined by laparoscopy 09/09/2016   Allergic rhinitis 05/10/2008    Past Surgical History:  Procedure Laterality Date   COLONOSCOPY WITH ESOPHAGOGASTRODUODENOSCOPY (EGD)     DILATION AND EVACUATION N/A 01/13/2021   Procedure: Suction D&C;  Surgeon: Vena Austria, MD;  Location: [facility];  Service: Gynecology;  Laterality:  N/A;   LAPAROSCOPY N/A 09/09/2016   Procedure: LAPAROSCOPY DIAGNOSTIC, FULGERATION OF ENDOMETRIOSIS;  Surgeon: Conard Novak, MD;  Location: [facility];  Service: Gynecology;  Laterality: N/A;   PILONIDAL CYST EXCISION N/A 04/10/2015   Procedure: CYST EXCISION PILONIDAL EXTENSIVE;  Surgeon: Kieth Brightly, MD;  Location: [facility];  Service: General;  Laterality: N/A;    Family History  Problem Relation Age of Onset   Hyperlipidemia Father    GER disease Father     Social History   Socioeconomic History   Marital status: Single    Spouse name: Brett Canales   Number of children: Not on file   Years of education: Not on file   Highest education level: Not on file  Occupational History   Not on file  Tobacco Use   Smoking status: Never   Smokeless tobacco: Never  Vaping Use   Vaping Use: Never used  Substance and Sexual Activity   Alcohol use: Yes    Comment: once every2 weeks   Drug use: Never   Sexual activity: Yes    Birth control/protection: None, Condom  Other Topics Concern   Not on file  Social History Narrative   ** Merged History Encounter **       Social Determinants of Health   Financial Resource Strain: Not on file  Food Insecurity: Not on file  Transportation Needs: Not on file  Physical Activity: Not on  file  Stress: Not on file  Social Connections: Not on file  Intimate Partner Violence: Not At Risk   Fear of Current or Ex-Partner: No   Emotionally Abused: No   Physically Abused: No   Sexually Abused: No    Outpatient Medications Prior to Visit  Medication Sig Dispense Refill   buPROPion (WELLBUTRIN XL) 150 MG 24 hr tablet Take 1 tablet (150 mg total) by mouth daily. (Patient not taking: No sig reported) 30 tablet 2   ibuprofen (ADVIL) 600 MG tablet Take 1 tablet (600 mg total) by mouth every 6 (six) hours as needed. 60 tablet 3   meclizine (ANTIVERT) 25 MG tablet Take 1 tablet (25 mg total) by mouth 3 (three) times daily as needed for dizziness.  30 tablet 0   norethindrone-ethinyl estradiol-FE (JUNEL FE 1/20) 1-20 MG-MCG tablet Take 1 tablet by mouth daily. 28 tablet 11   No facility-administered medications prior to visit.      ROS:  Review of Systems  Constitutional:  Negative for fever.  Gastrointestinal:  Negative for blood in stool, constipation, diarrhea, nausea and vomiting.  Genitourinary:  Positive for dyspareunia, pelvic pain, vaginal bleeding, vaginal discharge and vaginal pain. Negative for dysuria, flank pain, frequency, hematuria, menstrual problem and urgency.  Musculoskeletal:  Negative for back pain.  Skin:  Negative for rash.  BREAST: No symptoms   OBJECTIVE:   Vitals:  BP 110/60    Ht 5\' 2"  (1.575 m)    Wt 154 lb (69.9 kg)    LMP 10/26/2021 (Exact Date)    BMI 28.17 kg/m   Physical Exam Vitals reviewed.  Constitutional:      Appearance: She is well-developed.  Pulmonary:     Effort: Pulmonary effort is normal.  Abdominal:     Palpations: Abdomen is soft.     Tenderness: There is abdominal tenderness in the suprapubic area. There is no guarding or rebound.  Genitourinary:    General: Normal vulva.     Pubic Area: No rash.      Labia:        Right: No rash, tenderness or lesion.        Left: No rash, tenderness or lesion.      Vagina: No vaginal discharge, erythema or tenderness.     Cervix: No cervical motion tenderness, friability or cervical bleeding.     Uterus: Normal. Tender. Not enlarged.      Adnexa: Right adnexa normal and left adnexa normal.       Right: No mass or tenderness.         Left: No mass or tenderness.       Musculoskeletal:        General: Normal range of motion.     Cervical back: Normal range of motion.  Skin:    General: Skin is warm and dry.  Neurological:     General: No focal deficit present.     Mental Status: She is alert and oriented to person, place, and time.  Psychiatric:        Mood and Affect: Mood normal.        Behavior: Behavior normal.         Thought Content: Thought content normal.        Judgment: Judgment normal.    Results: Results for orders placed or performed in visit on 10/29/21 (from the past 24 hour(s))  POCT Wet Prep with KOH     Status: Abnormal   Collection Time: 10/29/21  1:44 PM  Result Value Ref Range   Trichomonas, UA Negative    Clue Cells Wet Prep HPF POC few    Epithelial Wet Prep HPF POC     Yeast Wet Prep HPF POC neg    Bacteria Wet Prep HPF POC     RBC Wet Prep HPF POC     WBC Wet Prep HPF POC     KOH Prep POC Negative Negative     Assessment/Plan: PCB (post coital bleeding) - Plan: US PELVIC COMPLETE WITH TRANSVAGINAL; pt with neg STD testing, already treated with doxy. Hasn't been sex active since to know if sx persist. Recheck pap today. Will f/u with results.   Dyspareunia in female - Plan: US PELVIC COMPLETE WITH TRANSVAGINAL; ectropion area of cx very tender to touch; not friable. Check GYN u/s. Hx of endometriosis with BC tx in past.   Pelvic pain - Plan: US PELVIC COMPLETE WITH TRANSVAGINAL  Endometriosis determined by laparoscopy  Cervical cancer screening - Plan: Cytology - PAP  BV (bacterial vaginosis) - Plan: POCT Wet Prep with KOH, metroNIDAZOLE (FLAGYL) 500 MG tablet; pos d/c and BV on testing 12/22 at [facility]. Rx flagyl, no EtOH. F/u prn.     Meds ordered this encounter  Medications   metroNIDAZOLE (FLAGYL) 500 MG tablet    Sig: Take 1 tab BID for 7 days; NO alcohol use for 10 days after prescription start    Dispense:  14 tablet    Refill:  0    Order Specific Question:   Supervising Provider    Answer:   Gae Dry [id number]      Return if symptoms worsen or fail to improve.  Avree Szczygiel B. Tashona Calk, PA-C 10/29/2021 1:50 PM

## 2021-10-29 ENCOUNTER — Ambulatory Visit (INDEPENDENT_AMBULATORY_CARE_PROVIDER_SITE_OTHER): Payer: Medicaid Other | Admitting: Obstetrics and Gynecology

## 2021-10-29 ENCOUNTER — Other Ambulatory Visit (HOSPITAL_COMMUNITY)
Admission: RE | Admit: 2021-10-29 | Discharge: 2021-10-29 | Disposition: A | Discharge status: Home / Self Care | Payer: Medicaid Other | Source: Ambulatory Visit | Attending: Obstetrics and Gynecology | Admitting: Obstetrics and Gynecology

## 2021-10-29 ENCOUNTER — Other Ambulatory Visit: Payer: Self-pay

## 2021-10-29 ENCOUNTER — Encounter: Payer: Self-pay | Admitting: Obstetrics and Gynecology

## 2021-10-29 VITALS — BP 110/60 | Ht 62.0 in | Wt 154.0 lb

## 2021-10-29 DIAGNOSIS — N760 Acute vaginitis: Secondary | ICD-10-CM | POA: Diagnosis not present

## 2021-10-29 DIAGNOSIS — R102 Pelvic and perineal pain unspecified side: Secondary | ICD-10-CM

## 2021-10-29 DIAGNOSIS — N809 Endometriosis, unspecified: Secondary | ICD-10-CM | POA: Diagnosis not present

## 2021-10-29 DIAGNOSIS — Z124 Encounter for screening for malignant neoplasm of cervix: Secondary | ICD-10-CM | POA: Insufficient documentation

## 2021-10-29 DIAGNOSIS — N941 Unspecified dyspareunia: Secondary | ICD-10-CM | POA: Diagnosis not present

## 2021-10-29 DIAGNOSIS — B9689 Other specified bacterial agents as the cause of diseases classified elsewhere: Secondary | ICD-10-CM | POA: Diagnosis not present

## 2021-10-29 DIAGNOSIS — N930 Postcoital and contact bleeding: Secondary | ICD-10-CM | POA: Diagnosis not present

## 2021-10-29 LAB — POCT WET PREP WITH KOH
KOH Prep POC: NEGATIVE
Trichomonas, UA: NEGATIVE
Yeast Wet Prep HPF POC: NEGATIVE

## 2021-10-29 MED ORDER — METRONIDAZOLE 500 MG PO TABS
ORAL_TABLET | ORAL | 0 refills | Status: DC
Start: 2021-10-29 — End: 2022-03-19

## 2021-11-02 LAB — CYTOLOGY - PAP
Diagnosis: NEGATIVE
Diagnosis: REACTIVE

## 2021-11-10 ENCOUNTER — Ambulatory Visit
Admission: RE | Admit: 2021-11-10 | Discharge: 2021-11-10 | Disposition: A | Discharge status: Home / Self Care | Payer: Medicaid Other | Source: Ambulatory Visit | Attending: Obstetrics and Gynecology | Admitting: Obstetrics and Gynecology

## 2021-11-10 DIAGNOSIS — N930 Postcoital and contact bleeding: Secondary | ICD-10-CM | POA: Insufficient documentation

## 2021-11-10 DIAGNOSIS — R102 Pelvic and perineal pain: Secondary | ICD-10-CM | POA: Insufficient documentation

## 2021-11-10 DIAGNOSIS — N941 Unspecified dyspareunia: Secondary | ICD-10-CM | POA: Diagnosis not present

## 2021-12-24 ENCOUNTER — Ambulatory Visit: Payer: Self-pay

## 2021-12-24 NOTE — Telephone Encounter (Signed)
? ? ? ?  Chief Complaint: Vomiting since Saturday. Keeping fluids down.Has anti-emetic at home she will try. ?Symptoms: Has some diarrhea ?Frequency: Saturday ?Pertinent Negatives: Patient denies fever ?Disposition: [] ED /[] Urgent Care (no appt availability in office) / [] Appointment(In office/virtual)/ []  [place] [facility]/ [x] Home Care/ [] Refused Recommended Disposition /[] [place] [facility]/ []  Follow-up with PCP ?Additional Notes: Call back tomorrow if no better or go to UC.  ?Reason for Disposition ? MILD or MODERATE vomiting (e.g., 1 - 5 times / day) ? ?Answer Assessment - Initial Assessment Questions ?1. VOMITING SEVERITY: "How many times have you vomited in the past 24 hours?"  ?   - MILD:  1 - 2 times/day ?   - MODERATE: 3 - 5 times/day, decreased oral intake without significant weight loss or symptoms of dehydration ?   - SEVERE: 6 or more times/day, vomits everything or nearly everything, with significant weight loss, symptoms of dehydration  ?    Moderate ?2. ONSET: "When did the vomiting begin?"  ?    Saturday ?3. FLUIDS: "What fluids or food have you vomited up today?" "Have you been able to keep any fluids down?" ?    Yes ?4. ABDOMINAL PAIN: "Are your having any abdominal pain?" If yes : "How bad is it and what does it feel like?" (e.g., crampy, dull, intermittent, constant)  ?    When she eats has discomfort ?5. DIARRHEA: "Is there any diarrhea?" If Yes, ask: "How many times today?"  ?    Yes ?6. CONTACTS: "Is there anyone else in the family with the same symptoms?"  ?    No ?7. CAUSE: "What do you think is causing your vomiting?" ?    Unsure ?8. HYDRATION STATUS: "Any signs of dehydration?" (e.g., dry mouth [not only dry lips], too weak to stand) "When did you last urinate?" ?    Feels weak, decreased ?9. OTHER SYMPTOMS: "Do you have any other symptoms?" (e.g., fever, headache, vertigo, vomiting blood or coffee grounds, recent head injury) ?    No ?10. PREGNANCY: "Is there any chance you  are pregnant?" "When was your last menstrual period?" ?      No ? ?Protocols used: Vomiting-A-AH ? ?

## 2022-01-15 ENCOUNTER — Other Ambulatory Visit: Payer: Self-pay

## 2022-01-15 ENCOUNTER — Ambulatory Visit (INDEPENDENT_AMBULATORY_CARE_PROVIDER_SITE_OTHER): Payer: Medicaid Other | Admitting: Licensed Practical Nurse

## 2022-01-15 ENCOUNTER — Other Ambulatory Visit (HOSPITAL_COMMUNITY)
Admission: RE | Admit: 2022-01-15 | Discharge: 2022-01-15 | Disposition: A | Discharge status: Home / Self Care | Payer: Medicaid Other | Source: Ambulatory Visit | Attending: Licensed Practical Nurse | Admitting: Licensed Practical Nurse

## 2022-01-15 VITALS — BP 120/60 | Ht 62.0 in | Wt 147.0 lb

## 2022-01-15 DIAGNOSIS — R102 Pelvic and perineal pain unspecified side: Secondary | ICD-10-CM

## 2022-01-15 DIAGNOSIS — N926 Irregular menstruation, unspecified: Secondary | ICD-10-CM | POA: Diagnosis not present

## 2022-01-15 DIAGNOSIS — R1033 Periumbilical pain: Secondary | ICD-10-CM

## 2022-01-15 LAB — POCT URINE PREGNANCY: Preg Test, Ur: NEGATIVE

## 2022-01-18 ENCOUNTER — Encounter: Payer: Self-pay | Admitting: Licensed Practical Nurse

## 2022-01-18 NOTE — Progress Notes (Signed)
? ?Gynecology Pelvic Pain Evaluation  ? ?Chief Complaint:  ?Chief Complaint  ?Patient presents with  ? Pain In Ovaries  ? ? ?History of Present Illness:   Patient is a 26 y.o. G2P1011 who LMP was Patient's last menstrual period was 12/31/2021 (exact date)., presents today for a problem visit.  She complains of  lower right sided pain x 1 week .  ? ?Her pain is localized to the RLQ area, described as intermittent and sharp, began  1 week ago  and its severity is described as moderate, severe. The pain radiates to the  Non-radiating. She has these associated symptoms which include bloating/abdominal distension, vomiting, and nausea. Reports having regular daily BM. She is occasionally sexual active, she does not use condoms. Uses the pull out method for contraception.  ?Has a hx of endometriosis and ovarian cysts,  ? ?Previous evaluation: office visit on 10/29/2021. Prior Diagnosis: endometriosis and pelvic pain . ?Previous Treatment: treated for BV, norm pelvic US 1/17.  ? ?PMHx: ?She  has a past medical history of Anxiety, Asthma, Bipolar disorder (HCC), Chronic pelvic pain in female (09/09/2016), Depression, Endometriosis determined by laparoscopy (09/09/2016), Ovarian cyst, Pilonidal cyst, and PONV (postoperative nausea and vomiting). Also,  has a past surgical history that includes Pilonidal cyst excision (N/A, 04/10/2015); Colonoscopy with esophagogastroduodenoscopy (egd); laparoscopy (N/A, 09/09/2016); and Dilation and evacuation (N/A, 01/13/2021)., family history includes GER disease in her father; Hyperlipidemia in her father.,  reports that she has never smoked. She has never used smokeless tobacco. She reports current alcohol use. She reports that she does not use drugs. ? ?She has a current medication list which includes the following prescription(s): metronidazole. Also, is allergic to aripiprazole and vicodin [hydrocodone-acetaminophen]. ? ?Review of Systems  ?Constitutional:  Positive for malaise/fatigue.   ?     Change in appetite   ?Gastrointestinal:  Positive for abdominal pain, nausea and vomiting.  ?Genitourinary:  Positive for frequency.  ?     Vaginal discharge   ?Neurological:  Positive for dizziness.  ?Endo/Heme/Allergies:   ?     Hot flashes   ?Psychiatric/Behavioral:  Negative for depression. The patient is not nervous/anxious.   ? ?Objective: ?BP 120/60   Ht 5\' 2"  (1.575 m)   Wt 147 lb (66.7 kg)   LMP 12/31/2021 (Exact Date)   BMI 26.89 kg/m?  ?Physical Exam ?Constitutional:   ?   Appearance: Normal appearance.  ?Genitourinary:  ?   Vulva normal.  ?   Genitourinary Comments: Spec exam: cervix pink, no lesions, white discharge present   ?Pulmonary:  ?   Effort: Pulmonary effort is normal.  ?Abdominal:  ?   Palpations: Abdomen is soft.  ?   Comments: Tenderness near umbilicus and RLQ, no masses  ?Musculoskeletal:     ?   General: Normal range of motion.  ?Neurological:  ?   General: No focal deficit present.  ?   Mental Status: She is alert and oriented to person, place, and time.  ?Skin: ?   General: Skin is warm.  ?Psychiatric:     ?   Mood and Affect: Mood normal.  ? ? ?Assessment: 26 y.o. G2P1011 with  pelvic pain . ? ?1. Missed period ? ?- POCT urine pregnancy ? ?2. Pelvic pain ?- Cervicovaginal ancillary only ?- 22 PELVIC COMPLETE WITH TRANSVAGINAL; Future ? ?3. Periumbilical abdominal pain ?- US Abdomen Complete; Future ? ?Problem List Items Addressed This Visit   ?None ?Visit Diagnoses   ? ? Pelvic pain    -  Primary  ? Relevant Orders  ? Cervicovaginal ancillary only  ? US PELVIC COMPLETE WITH TRANSVAGINAL  ? Missed period      ? Relevant Orders  ? POCT urine pregnancy (Completed)  ? Periumbilical abdominal pain      ? Relevant Orders  ? US Abdomen Complete  ? ?  ? ?Consider GI consult based on Abd [place] and symptoms ? ?Carie Caddy, CNM  ?Domingo Pulse, [facility]  ?01/18/22  ?9:46 AM  ? ?

## 2022-01-19 LAB — CERVICOVAGINAL ANCILLARY ONLY
Bacterial Vaginitis (gardnerella): NEGATIVE
Candida Glabrata: NEGATIVE
Candida Vaginitis: NEGATIVE
Chlamydia: NEGATIVE
Comment: NEGATIVE
Comment: NEGATIVE
Comment: NEGATIVE
Comment: NEGATIVE
Comment: NEGATIVE
Comment: NORMAL
Neisseria Gonorrhea: NEGATIVE
Trichomonas: NEGATIVE

## 2022-01-26 ENCOUNTER — Ambulatory Visit: Payer: Medicaid Other

## 2022-01-26 ENCOUNTER — Ambulatory Visit: Payer: Medicaid Other | Attending: Licensed Practical Nurse

## 2022-01-26 DIAGNOSIS — S60212A Contusion of left wrist, initial encounter: Secondary | ICD-10-CM | POA: Diagnosis not present

## 2022-01-26 DIAGNOSIS — S6992XA Unspecified injury of left wrist, hand and finger(s), initial encounter: Secondary | ICD-10-CM | POA: Diagnosis not present

## 2022-02-07 IMAGING — CR DG CHEST 2V
2 series · 2 of 2 positions shown · non-contrast
Comparison: None.

CLINICAL DATA: Chest pain for 2 weeks.  Shortness of breath.

EXAM:
CHEST - 2 VIEW

[chest pa]
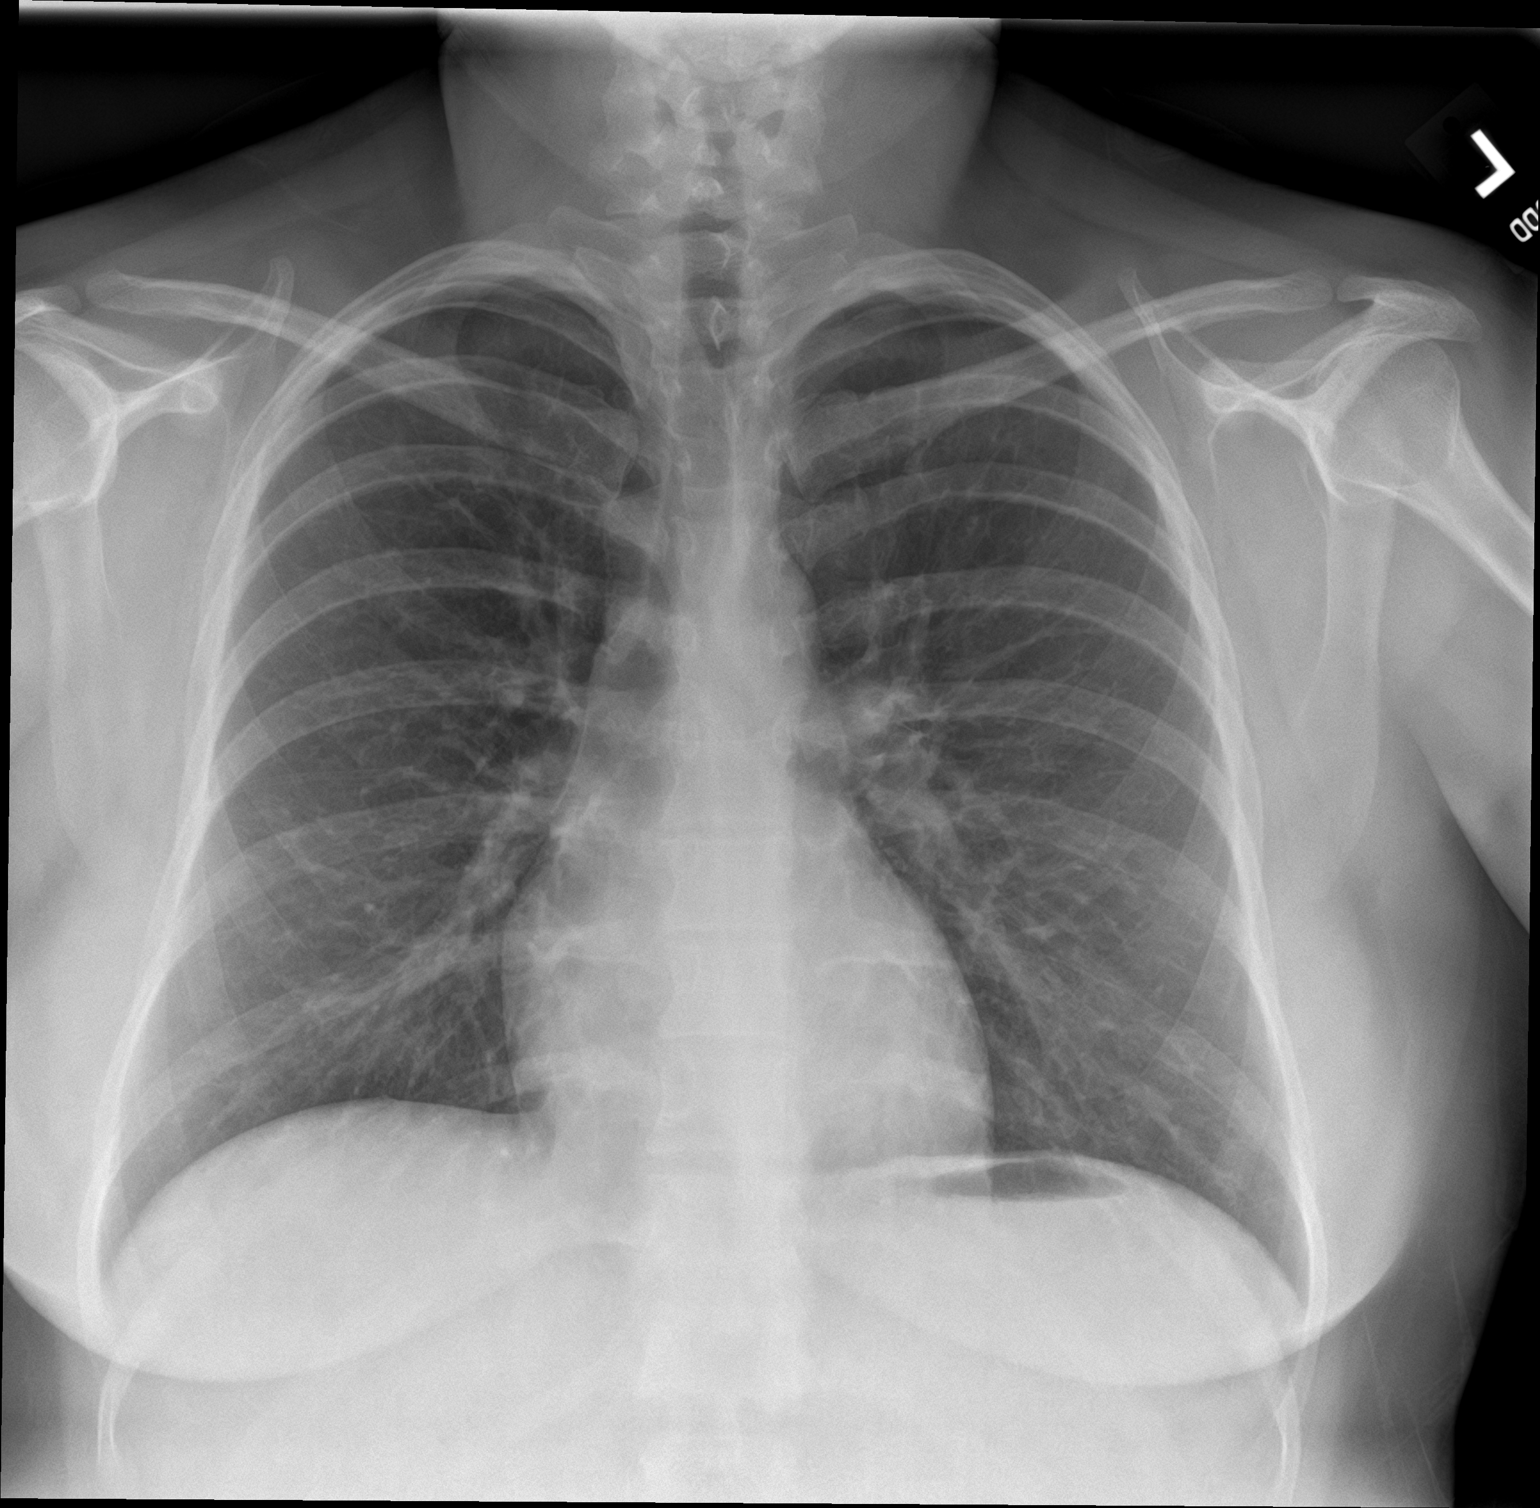

[chest lat]
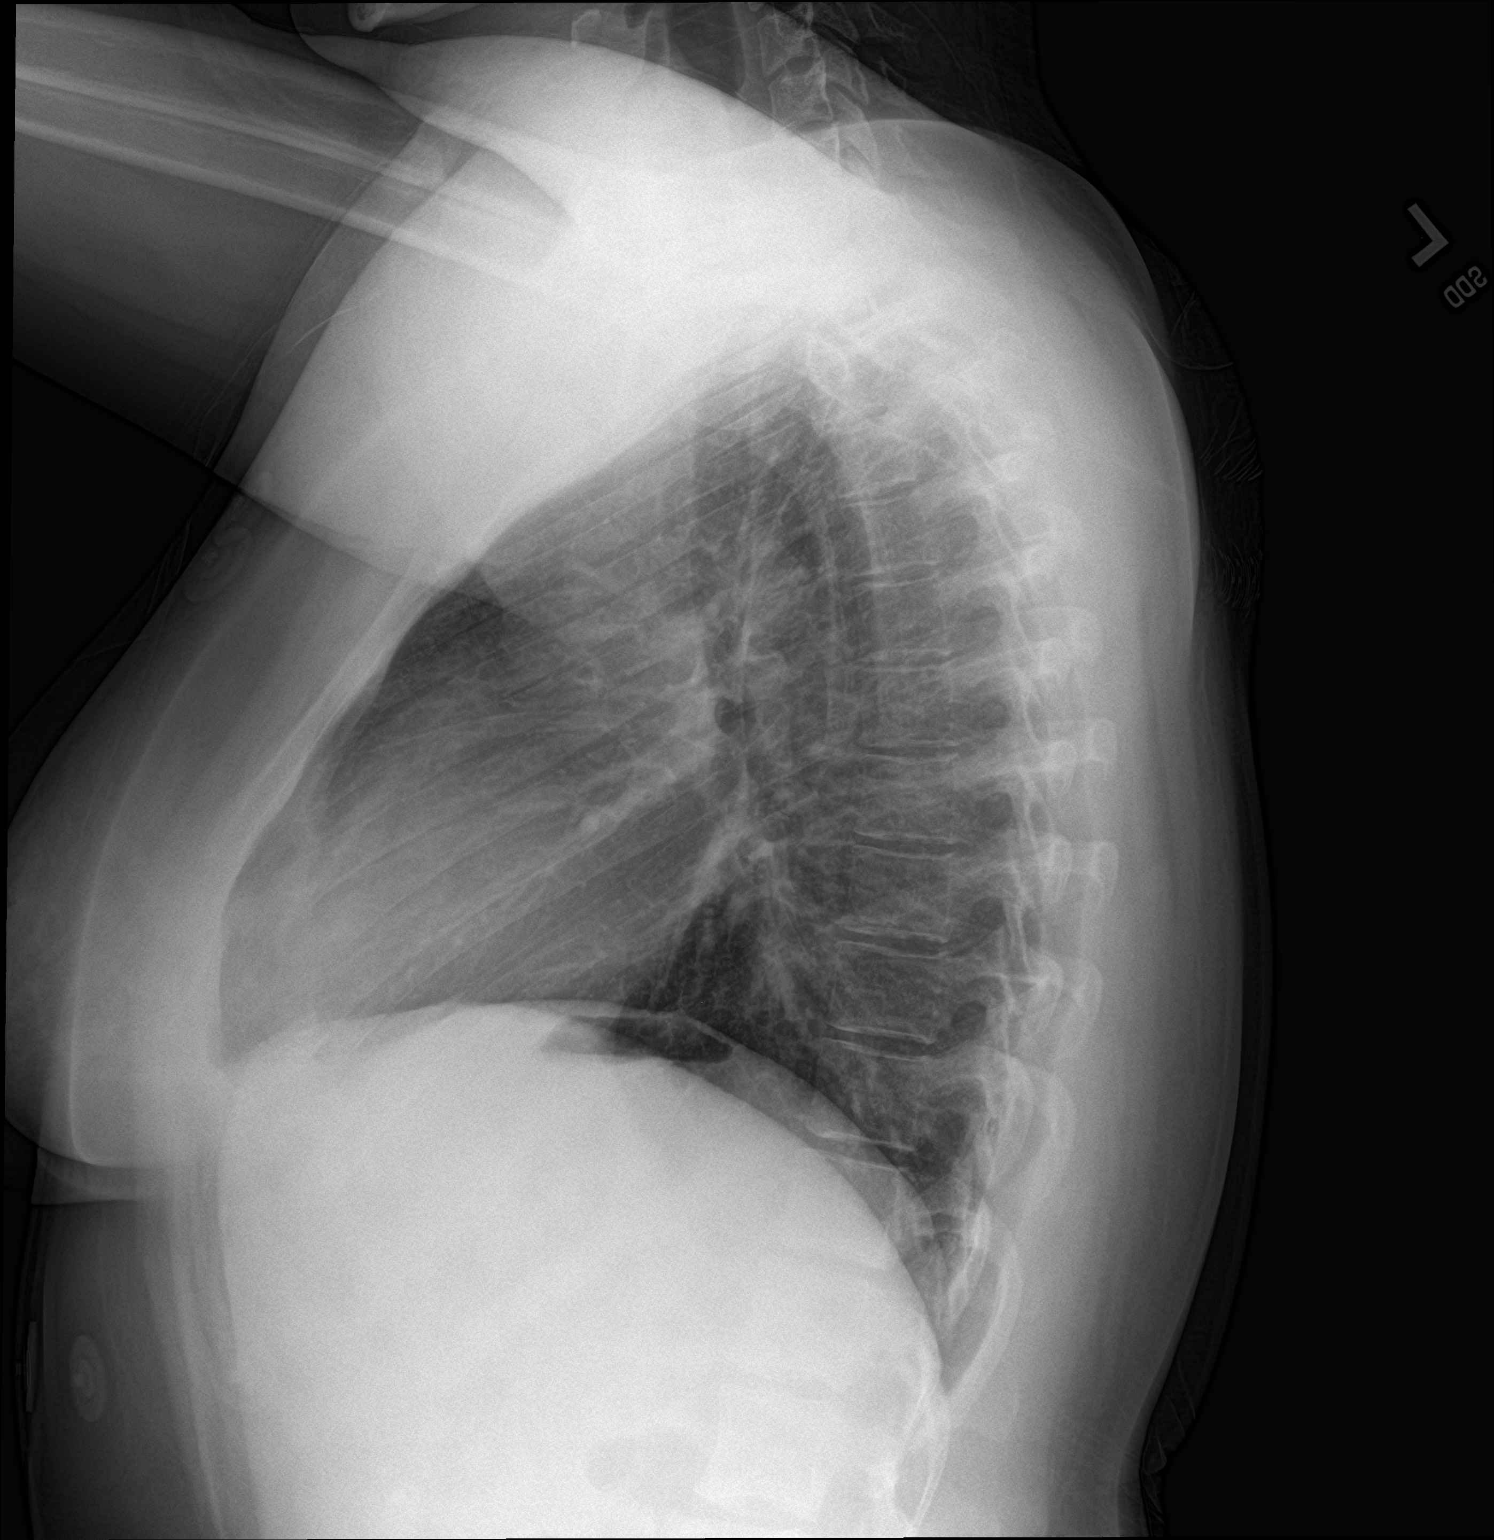

[2 of 2 positions shown; findings below may reference images not displayed]

FINDINGS: The heart size and mediastinal contours are within normal limits.
Both lungs are clear. The visualized skeletal structures are
unremarkable.
IMPRESSION: No active cardiopulmonary disease.

## 2022-02-21 DIAGNOSIS — Z03818 Encounter for observation for suspected exposure to other biological agents ruled out: Secondary | ICD-10-CM | POA: Diagnosis not present

## 2022-02-21 DIAGNOSIS — Z20822 Contact with and (suspected) exposure to covid-19: Secondary | ICD-10-CM | POA: Diagnosis not present

## 2022-03-19 ENCOUNTER — Encounter: Payer: Self-pay | Admitting: Family Medicine

## 2022-03-19 ENCOUNTER — Telehealth: Payer: Self-pay | Admitting: *Deleted

## 2022-03-19 ENCOUNTER — Ambulatory Visit (INDEPENDENT_AMBULATORY_CARE_PROVIDER_SITE_OTHER): Payer: Medicaid Other | Admitting: Family Medicine

## 2022-03-19 VITALS — BP 117/65 | HR 69 | Resp 14 | Wt 152.0 lb

## 2022-03-19 DIAGNOSIS — N926 Irregular menstruation, unspecified: Secondary | ICD-10-CM | POA: Diagnosis not present

## 2022-03-19 NOTE — Telephone Encounter (Signed)
Copied from [place]. Topic: General - Other >> Mar 18, 2022 12:35 PM Tessa Lerner A wrote: Reason for CRM: The patient believes that they're expecting   The patient has called to request lab orders for testing   Please contact further when possible

## 2022-03-19 NOTE — Telephone Encounter (Signed)
Please schedule patient for ov? Thanks.

## 2022-03-19 NOTE — Progress Notes (Signed)
Established patient visit  I,April Miller,acting as a scribe for Jacky Kindle, FNP.,have documented all relevant documentation on the behalf of Jacky Kindle, FNP,as directed by  Jacky Kindle, FNP while in the presence of Jacky Kindle, FNP.   Patient: Madison Dixon   DOB: 1995-12-04   25 y.o. Female  MRN: [id number] Visit Date: 03/19/2022  Today's healthcare provider: Jacky Kindle, FNP  Patient presents for new patient visit to establish care.  Introduced to Publishing rights manager role and practice setting.  All questions answered.  Discussed provider/patient relationship and expectations.   No chief complaint on file.  Subjective    HPI    Medications: Outpatient Medications Prior to Visit  Medication Sig   Prenatal MV-Min-Fe Fum-FA-DHA (PRENATAL 1 PO) Take by mouth.   [DISCONTINUED] metroNIDAZOLE (FLAGYL) 500 MG tablet Take 1 tab BID for 7 days; NO alcohol use for 10 days after prescription start (Patient not taking: Reported on 01/15/2022)   No facility-administered medications prior to visit.    Review of Systems     Objective    BP 117/65 (BP Location: Right Arm, Patient Position: Sitting, Cuff Size: Normal)   Pulse 69   Resp 14   Wt 152 lb (68.9 kg)   LMP 02/17/2022   SpO2 99%   BMI 27.80 kg/m    Physical Exam Vitals and nursing note reviewed.  Constitutional:      General: She is not in acute distress.    Appearance: Normal appearance. She is overweight. She is not ill-appearing, toxic-appearing or diaphoretic.  HENT:     Head: Normocephalic and atraumatic.  Cardiovascular:     Rate and Rhythm: Normal rate and regular rhythm.     Pulses: Normal pulses.     Heart sounds: Normal heart sounds. No murmur heard.   No friction rub. No gallop.  Pulmonary:     Effort: Pulmonary effort is normal. No respiratory distress.     Breath sounds: Normal breath sounds. No stridor. No wheezing, rhonchi or rales.  Chest:     Chest wall: No tenderness.   Abdominal:     General: Bowel sounds are normal.     Palpations: Abdomen is soft.  Musculoskeletal:        General: No swelling, tenderness, deformity or signs of injury. Normal range of motion.     Right lower leg: No edema.     Left lower leg: No edema.  Skin:    General: Skin is warm and dry.     Capillary Refill: Capillary refill takes less than 2 seconds.     Coloration: Skin is not jaundiced or pale.     Findings: No bruising, erythema, lesion or rash.  Neurological:     General: No focal deficit present.     Mental Status: She is alert and oriented to person, place, and time. Mental status is at baseline.     Cranial Nerves: No cranial nerve deficit.     Sensory: No sensory deficit.     Motor: No weakness.     Coordination: Coordination normal.  Psychiatric:        Mood and Affect: Mood normal.        Behavior: Behavior normal.        Thought Content: Thought content normal.        Judgment: Judgment normal.      No results found for any visits on 03/19/22.  Assessment & Plan     Problem  List Items Addressed This Visit       Other   Missed period - Primary    Last period 4/26 -symptoms cramping, low pain, dizziness -first home pregnancy test positive 5/22, repeated 5/23 and 5/24 Negative home test 5/26 Also has symptoms of breast tenderness and nausea 1 episode of unprotected intercourse with ovulation Is taking prenatals Has an OB       Relevant Orders   hCG, serum, qualitative   hCG, quantitative, pregnancy   Return if symptoms worsen or fail to improve.     Jacky Kindle, FNP  [facility] [phone] (phone) [phone] (fax)  [facility]

## 2022-03-19 NOTE — Telephone Encounter (Signed)
Left vm for pt to call back to schedule appt.  Okay for PEC to schedule.

## 2022-03-19 NOTE — Assessment & Plan Note (Signed)
Last period 4/26 -symptoms cramping, low pain, dizziness -first home pregnancy test positive 5/22, repeated 5/23 and 5/24 Negative home test 5/26 Also has symptoms of breast tenderness and nausea 1 episode of unprotected intercourse with ovulation Is taking prenatals Has an OB

## 2022-03-20 LAB — HCG, SERUM, QUALITATIVE: hCG,Beta Subunit,Qual,Serum: NEGATIVE m[IU]/mL (ref <6)

## 2022-04-21 ENCOUNTER — Ambulatory Visit (INDEPENDENT_AMBULATORY_CARE_PROVIDER_SITE_OTHER): Payer: Medicaid Other

## 2022-04-21 DIAGNOSIS — N912 Amenorrhea, unspecified: Secondary | ICD-10-CM

## 2022-04-21 LAB — POCT URINE PREGNANCY: Preg Test, Ur: POSITIVE — AB

## 2022-04-21 NOTE — Progress Notes (Signed)
Subjective:    Madison Dixon is a 26 y.o. female who presents for evaluation of amenorrhea. She believes she could be pregnant. Pregnancy is desired.  Last period was normal 03/20/22.   No LMP recorded. The following portions of the patient's history were reviewed and updated as appropriate: allergies, current medications, past family history, past medical history, past social history, past surgical history, and problem list.   Lab Review Urine HCG: positive    Assessment:    Absence of menstruation.     Plan:    Pregnancy Test:  Positive: EDC: 12/25/2022. Briefly discussed positive results and sent to check out for scheduling for New OB appointments.  Nicholos Johns. W, NCMA

## 2022-04-22 ENCOUNTER — Telehealth: Payer: Self-pay

## 2022-04-22 DIAGNOSIS — Z3490 Encounter for supervision of normal pregnancy, unspecified, unspecified trimester: Secondary | ICD-10-CM

## 2022-04-22 NOTE — Telephone Encounter (Signed)
Copied from CRM [id number]. Topic: Appointment Scheduling - Scheduling Inquiry for Clinic >> Apr 22, 2022 11:42 AM Franchot Heidelberg wrote: Reason for CRM: Pt just went to her OBGYN and confirmed her pregnancy, she wants lab orders to have her hCG rechecked. Please advise  Best contact: [phone]

## 2022-04-22 NOTE — Telephone Encounter (Signed)
Spoke with patient. Labs are ordered.

## 2022-04-23 ENCOUNTER — Ambulatory Visit: Payer: Medicaid Other

## 2022-04-26 DIAGNOSIS — Z3490 Encounter for supervision of normal pregnancy, unspecified, unspecified trimester: Secondary | ICD-10-CM | POA: Diagnosis not present

## 2022-04-27 LAB — BETA HCG QUANT (REF LAB): hCG Quant: 431 m[IU]/mL

## 2022-04-28 NOTE — Progress Notes (Signed)
Pregnancy confirmed; 4-[redacted] weeks gestation. Please follow up with GYN.  Jacky Kindle, FNP  [facility] [address] [place], [place] [address] [phone] (phone) [phone] (fax) [facility]

## 2022-04-30 ENCOUNTER — Ambulatory Visit (INDEPENDENT_AMBULATORY_CARE_PROVIDER_SITE_OTHER): Payer: Medicaid Other

## 2022-04-30 ENCOUNTER — Other Ambulatory Visit (HOSPITAL_COMMUNITY)
Admission: RE | Admit: 2022-04-30 | Discharge: 2022-04-30 | Disposition: A | Discharge status: Home / Self Care | Payer: Medicaid Other | Source: Ambulatory Visit | Attending: Advanced Practice Midwife | Admitting: Advanced Practice Midwife

## 2022-04-30 VITALS — BP 120/64 | Ht 62.0 in | Wt 159.0 lb

## 2022-04-30 DIAGNOSIS — N898 Other specified noninflammatory disorders of vagina: Secondary | ICD-10-CM | POA: Insufficient documentation

## 2022-04-30 DIAGNOSIS — R350 Frequency of micturition: Secondary | ICD-10-CM

## 2022-04-30 LAB — POCT URINALYSIS DIPSTICK OB
Bilirubin, UA: NEGATIVE
Blood, UA: NEGATIVE
Glucose, UA: NEGATIVE
Ketones, UA: NEGATIVE
Nitrite, UA: NEGATIVE
Odor: POSITIVE
Spec Grav, UA: <=1.005 — AB (ref 1.010–1.025)
Urobilinogen, UA: NEGATIVE E.U./dL — AB
pH, UA: 7 (ref 5.0–8.0)

## 2022-04-30 NOTE — Progress Notes (Signed)
    GYNECOLOGY   Subjective:    Patient ID: UNIQUA KIHN, female    DOB: [birth year], 26 y.o.   MRN: [id number]  HPI  Patient is a 26 y.o. G40P1011 female who presents for vaginal white discharge, vaginal odor and some frequency.

## 2022-05-02 LAB — URINE CULTURE

## 2022-05-04 ENCOUNTER — Telehealth: Payer: Self-pay

## 2022-05-04 DIAGNOSIS — B9689 Other specified bacterial agents as the cause of diseases classified elsewhere: Secondary | ICD-10-CM

## 2022-05-04 LAB — CERVICOVAGINAL ANCILLARY ONLY
Bacterial Vaginitis (gardnerella): POSITIVE — AB
Candida Glabrata: NEGATIVE
Candida Vaginitis: NEGATIVE
Chlamydia: NEGATIVE
Comment: NEGATIVE
Comment: NEGATIVE
Comment: NEGATIVE
Comment: NEGATIVE
Comment: NEGATIVE
Comment: NORMAL
Neisseria Gonorrhea: NEGATIVE
Trichomonas: NEGATIVE

## 2022-05-04 MED ORDER — METRONIDAZOLE 0.75 % VA GEL: 1.0000 | Freq: Every day | VAGINAL | 5 refills | Status: DC

## 2022-05-04 NOTE — Telephone Encounter (Signed)
Spoke w/patient. She has viewed results in my chart. Advised urine dip in office was abnormal, however, culture show normal urogenital flora and no treatment is needed for this. Her vaginal swab is positive for Bacterial Vaginosis. Advised rx for Flagyl will be sent. She will follow up if symptoms worsen or fail to improve.

## 2022-05-04 NOTE — Telephone Encounter (Signed)
TRAIGE VOICEMAIL: Patient hasn't heard anything about her test results yet. Cb#[phone]

## 2022-05-06 ENCOUNTER — Other Ambulatory Visit: Payer: Self-pay | Admitting: Obstetrics

## 2022-05-10 ENCOUNTER — Ambulatory Visit (INDEPENDENT_AMBULATORY_CARE_PROVIDER_SITE_OTHER): Payer: Medicaid Other

## 2022-05-10 ENCOUNTER — Ambulatory Visit: Payer: Medicaid Other

## 2022-05-10 VITALS — Wt 160.0 lb

## 2022-05-10 DIAGNOSIS — Z3480 Encounter for supervision of other normal pregnancy, unspecified trimester: Secondary | ICD-10-CM | POA: Insufficient documentation

## 2022-05-10 DIAGNOSIS — Z3689 Encounter for other specified antenatal screening: Secondary | ICD-10-CM

## 2022-05-10 NOTE — Progress Notes (Signed)
New OB Intake  I connected with  Marney Setting on 05/10/22 at  8:15 AM EDT by telephone Video Visit and verified that I am speaking with the correct person using two identifiers. Nurse is located at [facility] and pt is located at home.  I explained I am completing New OB Intake today. We discussed her EDD of 12/25/2022 that is based on LMP of 03/20/2022. Pt is G3/P1011. I reviewed her allergies, medications, Medical/Surgical/OB history, and appropriate screenings. Based on history, this is a/an pregnancy uncomplicated .   Patient Active Problem List   Diagnosis Date Noted   Missed period 03/19/2022   BV (bacterial vaginosis) 10/29/2021   Missed abortion    Inverted nipple 02/07/2020   Chronic pelvic pain in female 09/09/2016   Endometriosis determined by laparoscopy 09/09/2016   Allergic rhinitis 05/10/2008    Concerns addressed today Pt asked if she could have an u/s to be sure everything is alright since she lost her last preg; adv pt to d/w Claris Che as we have to have a medical reason to have u/s done.  Delivery Plans:  Plans to deliver at [facility].  Anatomy US Explained first scheduled [place] will be around 20 weeks.   Labs Discussed genetic screening with patient. Patient desires genetic testing to be drawn with new OB labs. Discussed possible labs to be drawn at new OB appointment.  COVID Vaccine Patient has not had COVID vaccine.   Social Determinants of Health Food Insecurity: denies food insecurity Transportation: Patient denies transportation needs. Childcare: Discussed no children allowed at ultrasound appointments.   First visit review I reviewed new OB appt with pt. I explained she will have ob bloodwork and pap smear/pelvic exam if indicated. Explained pt will be seen by Paula Compton, CNM at first visit; encounter routed to appropriate provider.   Loran Senters, [facility] 05/10/2022  8:33 AM  Clinical Staff Provider  Office Location  [facility]  OBGYN Dating    Language  English Anatomy US    Flu Vaccine  offer Genetic Screen  NIPS:   TDaP vaccine   offer Hgb A1C or  GTT Early : Third trimester :   Covid declines   LAB RESULTS   Rhogam   Blood Type     Feeding Plan Breast Antibody    Contraception undecided Rubella    Circumcision yes RPR     Pediatrician  [facility] HBsAg     Support Person Brett Canales HIV    Prenatal Classes no Varicella     GBS  (For PCN allergy, check sensitivities)   BTL Consent  Hep C   VBAC Consent  Pap      Hgb Electro      CF      SMA

## 2022-05-10 NOTE — Progress Notes (Unsigned)
New OB Intake  I connected with  Madison Dixon on 05/10/22 at  8:15 AM EDT by telephone Video Visit and verified that I am speaking with the correct person using two identifiers. Nurse is located at [facility] and pt is located at home.  I explained I am completing New OB Intake today. We discussed her EDD of 12/25/2022 that is based on LMP of 03/20/2022. Pt is G3/P1011. I reviewed her allergies, medications, Medical/Surgical/OB history, and appropriate screenings. Based on history, this is a/an pregnancy uncomplicated .   Patient Active Problem List   Diagnosis Date Noted   Supervision of other normal pregnancy, antepartum 05/10/2022   Missed period 03/19/2022   BV (bacterial vaginosis) 10/29/2021   Missed abortion    Inverted nipple 02/07/2020   Chronic pelvic pain in female 09/09/2016   Endometriosis determined by laparoscopy 09/09/2016   Allergic rhinitis 05/10/2008    Concerns addressed today Pt asked if she could have an u/s to be sure everything is alright since she lost her last preg; adv pt to d/w Claris Che as we have to have a medical reason to have u/s done.  Delivery Plans:  Plans to deliver at [facility].  Anatomy US Explained first scheduled [place] will be around 20 weeks.   Labs Discussed genetic screening with patient. Patient desires genetic testing to be drawn with new OB labs. Discussed possible labs to be drawn at new OB appointment.  COVID Vaccine Patient has not had COVID vaccine.   Social Determinants of Health Food Insecurity: denies food insecurity Transportation: Patient denies transportation needs. Childcare: Discussed no children allowed at ultrasound appointments.   First visit review I reviewed new OB appt with pt. I explained she will have ob bloodwork and pap smear/pelvic exam if indicated. Explained pt will be seen by Paula Compton, CNM at first visit; encounter routed to appropriate provider.   Loran Senters, [facility] 05/10/2022   9:09 AM  Clinical Staff Provider  Office Location  [facility] Dating    Language  English Anatomy US    Flu Vaccine  offer Genetic Screen  NIPS:   TDaP vaccine   offer Hgb A1C or  GTT Early : Third trimester :   Covid declines   LAB RESULTS   Rhogam   Blood Type     Feeding Plan Breast Antibody    Contraception undecided Rubella    Circumcision yes RPR     Pediatrician  [facility]     Support Person Brett Canales HIV    Prenatal Classes no Varicella     GBS  (For PCN allergy, check sensitivities)   BTL Consent  Hep C   VBAC Consent  Pap      Hgb Electro      CF      SMA

## 2022-05-10 NOTE — Progress Notes (Signed)
New OB Intake  I connected with  Madison Dixon on 05/10/22 at  8:15 AM EDT by telephone Video Visit and verified that I am speaking with the correct person using two identifiers. Nurse is located at [facility] and pt is located at home.  I explained I am completing New OB Intake today. We discussed her EDD of 12/25/2022 that is based on LMP of 03/20/2022. Pt is G3/P1011. I reviewed her allergies, medications, Medical/Surgical/OB history, and appropriate screenings. Based on history, this is a/an pregnancy uncomplicated .   Patient Active Problem List   Diagnosis Date Noted   Supervision of other normal pregnancy, antepartum 05/10/2022   Missed period 03/19/2022   BV (bacterial vaginosis) 10/29/2021   Missed abortion    Inverted nipple 02/07/2020   Chronic pelvic pain in female 09/09/2016   Endometriosis determined by laparoscopy 09/09/2016   Allergic rhinitis 05/10/2008    Concerns addressed today Pt asked for an u/c to be sure everything is okay since she lost her previous preg; adv to d/w Claris Che at [employer] [facility].  Delivery Plans:  Plans to deliver at [facility]  Anatomy US Explained first scheduled [place] will be around 20 weeks.   Labs Discussed genetic screening with patient. Patient desires genetic testing to be drawn with new OB labs. Discussed possible labs to be drawn at new OB appointment.  COVID Vaccine Patient has not had COVID vaccine.   Social Determinants of Health Food Insecurity: denies food insecurity Transportation: Patient denies transportation needs. Childcare: Discussed no children allowed at ultrasound appointments.   First visit review I reviewed new OB appt with pt. I explained she will have ob bloodwork and pap smear/pelvic exam if indicated. Explained pt will be seen by Paula Compton, CNM at first visit; encounter routed to appropriate provider.   Loran Senters, [facility] 05/10/2022  10:12 AM  Clinical Staff Provider  Office Location   [facility] Dating    Language  English Anatomy US    Flu Vaccine  offer Genetic Screen  NIPS:   TDaP vaccine   offer Hgb A1C or  GTT Early : Third trimester :   Covid declines   LAB RESULTS   Rhogam   Blood Type     Feeding Plan Breast Antibody    Contraception undecided Rubella    Circumcision yes RPR     Pediatrician  [facility]     Support Person Brett Canales HIV    Prenatal Classes no Varicella     GBS  (For PCN allergy, check sensitivities)   BTL Consent  Hep C   VBAC Consent  Pap      Hgb Electro      CF      SMA

## 2022-05-11 ENCOUNTER — Encounter: Payer: Medicaid Other | Admitting: Obstetrics

## 2022-05-12 ENCOUNTER — Encounter: Payer: Self-pay | Admitting: Obstetrics

## 2022-05-17 ENCOUNTER — Other Ambulatory Visit: Payer: Self-pay | Admitting: Obstetrics

## 2022-05-17 DIAGNOSIS — Z3480 Encounter for supervision of other normal pregnancy, unspecified trimester: Secondary | ICD-10-CM

## 2022-05-25 ENCOUNTER — Ambulatory Visit
Admission: RE | Admit: 2022-05-25 | Discharge: 2022-05-25 | Disposition: A | Discharge status: Home / Self Care | Payer: Medicaid Other | Source: Ambulatory Visit | Attending: Obstetrics | Admitting: Obstetrics

## 2022-05-25 DIAGNOSIS — Z419 Encounter for procedure for purposes other than remedying health state, unspecified: Secondary | ICD-10-CM | POA: Diagnosis not present

## 2022-05-25 DIAGNOSIS — Z3480 Encounter for supervision of other normal pregnancy, unspecified trimester: Secondary | ICD-10-CM | POA: Insufficient documentation

## 2022-05-25 DIAGNOSIS — Z3A08 8 weeks gestation of pregnancy: Secondary | ICD-10-CM | POA: Diagnosis not present

## 2022-05-25 DIAGNOSIS — Z3A09 9 weeks gestation of pregnancy: Secondary | ICD-10-CM | POA: Diagnosis not present

## 2022-05-25 DIAGNOSIS — Z3687 Encounter for antenatal screening for uncertain dates: Secondary | ICD-10-CM | POA: Diagnosis not present

## 2022-05-25 DIAGNOSIS — O26841 Uterine size-date discrepancy, first trimester: Secondary | ICD-10-CM | POA: Diagnosis not present

## 2022-06-04 ENCOUNTER — Other Ambulatory Visit: Payer: Medicaid Other

## 2022-06-04 DIAGNOSIS — Z3480 Encounter for supervision of other normal pregnancy, unspecified trimester: Secondary | ICD-10-CM

## 2022-06-05 LAB — CBC/D/PLT+RPR+RH+ABO+RUBIGG...
Antibody Screen: NEGATIVE
Basophils Absolute: 0 10*3/uL (ref 0.0–0.2)
Basos: 1 %
EOS (ABSOLUTE): 0.1 10*3/uL (ref 0.0–0.4)
Eos: 1 %
HCV Ab: NONREACTIVE
HIV Screen 4th Generation wRfx: NONREACTIVE
Hematocrit: 33.7 % — ABNORMAL LOW (ref 34.0–46.6)
Hemoglobin: 11.4 g/dL (ref 11.1–15.9)
Hepatitis B Surface Ag: NEGATIVE
Immature Grans (Abs): 0 10*3/uL (ref 0.0–0.1)
Immature Granulocytes: 1 %
Lymphocytes Absolute: 1.8 10*3/uL (ref 0.7–3.1)
Lymphs: 24 %
MCH: 30.6 pg (ref 26.6–33.0)
MCHC: 33.8 g/dL (ref 31.5–35.7)
MCV: 90 fL (ref 79–97)
Monocytes Absolute: 0.4 10*3/uL (ref 0.1–0.9)
Monocytes: 5 %
Neutrophils Absolute: 5 10*3/uL (ref 1.4–7.0)
Neutrophils: 68 %
Platelets: 220 10*3/uL (ref 150–450)
RBC: 3.73 x10E6/uL — ABNORMAL LOW (ref 3.77–5.28)
RDW: 12.6 % (ref 11.7–15.4)
RPR Ser Ql: NONREACTIVE
Rh Factor: POSITIVE
Rubella Antibodies, IGG: 1.88 index (ref >0.99)
Varicella zoster IgG: <135 index — ABNORMAL LOW (ref >165)
WBC: 7.3 10*3/uL (ref 3.4–10.8)

## 2022-06-05 LAB — HCV INTERPRETATION

## 2022-06-09 LAB — MATERNIT 21 PLUS CORE, BLOOD
Fetal Fraction: 5
Result (T21): NEGATIVE
Trisomy 13 (Patau syndrome): NEGATIVE
Trisomy 18 (Edwards syndrome): NEGATIVE
Trisomy 21 (Down syndrome): NEGATIVE

## 2022-06-10 ENCOUNTER — Other Ambulatory Visit (HOSPITAL_COMMUNITY)
Admission: RE | Admit: 2022-06-10 | Discharge: 2022-06-10 | Disposition: A | Discharge status: Home / Self Care | Payer: Medicaid Other | Source: Ambulatory Visit | Attending: Obstetrics | Admitting: Obstetrics

## 2022-06-10 ENCOUNTER — Ambulatory Visit (INDEPENDENT_AMBULATORY_CARE_PROVIDER_SITE_OTHER): Payer: Medicaid Other | Admitting: Obstetrics

## 2022-06-10 VITALS — BP 118/70 | Wt 159.0 lb

## 2022-06-10 DIAGNOSIS — Z3A11 11 weeks gestation of pregnancy: Secondary | ICD-10-CM | POA: Diagnosis not present

## 2022-06-10 DIAGNOSIS — Z3480 Encounter for supervision of other normal pregnancy, unspecified trimester: Secondary | ICD-10-CM

## 2022-06-10 DIAGNOSIS — O26891 Other specified pregnancy related conditions, first trimester: Secondary | ICD-10-CM | POA: Diagnosis not present

## 2022-06-10 NOTE — Progress Notes (Signed)
NOB today. No vb. No lof. Pt has had bloodowork and dating scan and NOB intake. Not due for pap.

## 2022-06-10 NOTE — Progress Notes (Signed)
New Obstetric Patient H&P    Chief Complaint: "Desires prenatal care"   History of Present Illness: Patient is a 26 y.o. [id number] Not Hispanic or Latino female, LMP 03/20/2022 presents with amenorrhea and positive home pregnancy test. Based on her  LMP, her EDD is Estimated Date of Delivery: 12/31/22 and her EGA is [redacted]w[redacted]d. Cycles are 6. days, regular, and occur approximately every : 28 days. Her last pap smear was about 7 months ago and was no abnormalities.    She had a urine pregnancy test which was positive about 4 week(s)  ago. Her last menstrual period was normal and lasted for  about 6 day(s). Since her LMP she claims she has experienced fatigue, breast tenderness and occasionally, some SOB. She denies vaginal bleeding. Her past medical history is noncontributory. Her prior pregnancies are notable for one SVD and one SAB  Since her LMP, she admits to the use of tobacco products  no She claims she has gained   no pounds since the start of her pregnancy.  There are cats in the home in the home  no  She admits close contact with children on a regular basis  yes  She has had chicken pox in the past no She has had Tuberculosis exposures, symptoms, or previously tested positive for TB   no Current or past history of domestic violence. no  Genetic Screening/Teratology Counseling: (Includes patient, baby's father, or anyone in either family with:)   1. Patient's age >/= 52 at [facility]  no 2. Thalassemia ([place], [place], Mediterranean, or Asian background): MCV<80  no 3. Neural tube defect (meningomyelocele, spina bifida, anencephaly)  no 4. Congenital heart defect  no  5. Down syndrome  no 6. Tay-Sachs (Jewish, [place]  no 7. Canavan's Disease  no 8. Sickle cell disease or trait (African)  no  9. Hemophilia or other blood disorders  no  10. Muscular dystrophy  no  11. Cystic fibrosis  no  12. Huntington's Chorea  no  13. Mental retardation/autism  no 14. Other inherited  genetic or chromosomal disorder  no 15. Maternal metabolic disorder (DM, PKU, etc)  no 16. Patient or FOB with a child with a birth defect not listed above no  16a. Patient or FOB with a birth defect themselves no 17. Recurrent pregnancy loss, or stillbirth  no  18. Any medications since LMP other than prenatal vitamins (include vitamins, supplements, OTC meds, drugs, alcohol)  no 19. Any other genetic/environmental exposure to discuss  no  Infection History:   1. Lives with someone with TB or TB exposed  no  2. Patient or partner has history of genital herpes  no 3. Rash or viral illness since LMP  no 4. History of STI (GC, CT, HPV, syphilis, HIV)  no 5. History of recent travel :  no  Other pertinent information:  no     Review of Systems:10 point review of systems negative unless otherwise noted in HPI  Past Medical History:  Past Medical History:  Diagnosis Date  . Anxiety   . Asthma    sports induced  . Bipolar disorder (HCC)   . Chronic pelvic pain in female 09/09/2016  . Depression   . Endometriosis determined by laparoscopy 09/09/2016  . Ovarian cyst   . Pilonidal cyst   . PONV (postoperative nausea and vomiting)     Past Surgical History:  Past Surgical History:  Procedure Laterality Date  . COLONOSCOPY WITH ESOPHAGOGASTRODUODENOSCOPY (EGD)    .  DILATION AND EVACUATION N/A 01/13/2021   Procedure: Suction D&C;  Surgeon: Vena Austria, MD;  Location: [facility] ORS;  Service: Gynecology;  Laterality: N/A;  . LAPAROSCOPY N/A 09/09/2016   Procedure: LAPAROSCOPY DIAGNOSTIC, FULGERATION OF ENDOMETRIOSIS;  Surgeon: Conard Novak, MD;  Location: [facility] ORS;  Service: Gynecology;  Laterality: N/A;  . PILONIDAL CYST EXCISION N/A 04/10/2015   Procedure: CYST EXCISION PILONIDAL EXTENSIVE;  Surgeon: Kieth Brightly, MD;  Location: [facility] ORS;  Service: General;  Laterality: N/A;  . WISDOM TOOTH EXTRACTION     lower two taken out;  age 61    Gynecologic History:  Patient's last menstrual period was 03/20/2022 (exact date).  Obstetric History: G3P1011  Family History:  Family History  Problem Relation Age of Onset  . Hyperlipidemia Father   . GER disease Father   . Cancer Maternal Grandmother        unknown  . Cancer Paternal Grandmother        unknown    Social History:  Social History   Socioeconomic History  . Marital status: Divorced    Spouse name: Brett Canales  . Number of children: 1  . Years of education: 45  . Highest education level: Not on file  Occupational History  . Occupation: [employer]  Tobacco Use  . Smoking status: Never  . Smokeless tobacco: Never  Vaping Use  . Vaping Use: Never used  Substance and Sexual Activity  . Alcohol use: Not Currently    Comment: once every2 weeks  . Drug use: Never  . Sexual activity: Yes    Partners: Male    Birth control/protection: None, Condom  Other Topics Concern  . Not on file  Social History Narrative   ** Merged History Encounter **       Social Determinants of Health   Financial Resource Strain: Low Risk  (05/10/2022)   Overall Financial Resource Strain (CARDIA)   . Difficulty of Paying Living Expenses: Not hard at all  Food Insecurity: No Food Insecurity (05/10/2022)   Hunger Vital Sign   . Worried About Programme researcher, broadcasting/film/video in the Last Year: Never true   . Ran Out of Food in the Last Year: Never true  Transportation Needs: No Transportation Needs (06/19/2019)   PRAPARE - Transportation   . Lack of Transportation (Medical): No   . Lack of Transportation (Non-Medical): No  Physical Activity: Inactive (05/10/2022)   Exercise Vital Sign   . Days of Exercise per Week: 0 days   . Minutes of Exercise per Session: 0 min  Stress: No Stress Concern Present (05/10/2022)   [employer] of Occupational Health - Occupational Stress Questionnaire   . Feeling of Stress : Not at all  Social Connections: Socially Isolated (05/10/2022)   Social Connection and  Isolation Panel [NHANES]   . Frequency of Communication with Friends and Family: More than three times a week   . Frequency of Social Gatherings with Friends and Family: More than three times a week   . Attends Religious Services: Never   . Active Member of Clubs or Organizations: No   . Attends Banker Meetings: Never   . Marital Status: Divorced  Catering manager Violence: Not At Risk (05/10/2022)   Humiliation, Afraid, Rape, and Kick questionnaire   . Fear of Current or Ex-Partner: No   . Emotionally Abused: No   . Physically Abused: No   . Sexually Abused: No    Allergies:  Allergies  Allergen Reactions  . Aripiprazole  Nausea And Vomiting  . Vicodin [Hydrocodone-Acetaminophen] Nausea And Vomiting    Medications: Prior to Admission medications   Medication Sig Start Date End Date Taking? Authorizing Provider  metroNIDAZOLE (METROGEL) 0.75 % vaginal gel Place 1 Applicatorful vaginally at bedtime. Apply one applicatorful to vagina at bedtime for 10 days, then twice a week for 6 months. 05/04/22  Yes Tresea Mall, CNM  Prenatal MV-Min-Fe Fum-FA-DHA (PRENATAL 1 PO) Take by mouth.   Yes [provider]    Physical Exam Vitals: Blood pressure 118/70, weight 159 lb (72.1 kg), last menstrual period 03/20/2022, unknown if currently breastfeeding.  General: NAD HEENT: normocephalic, anicteric Thyroid: no enlargement, no palpable nodules Pulmonary: No increased work of breathing, CTAB Cardiovascular: RRR, distal pulses 2+ Abdomen: NABS, soft, non-tender, non-distended.  Umbilicus without lesions.  No hepatomegaly, splenomegaly or masses palpable. No evidence of hernia  Genitourinary:  External: Normal external female genitalia.  Normal urethral meatus, normal  Bartholin's and Skene's glands.    Vagina: Normal vaginal mucosa, no evidence of prolapse.    Cervix: Grossly normal in appearance, no bleeding  Uterus:  Non-enlarged, mobile, normal contour.  No  CMT  Adnexa: ovaries non-enlarged, no adnexal masses  Rectal: deferred Extremities: no edema, erythema, or tenderness Neurologic: Grossly intact Psychiatric: mood appropriate, affect full   Assessment: 26 y.o. G3P1011 at [redacted]w[redacted]d presenting to initiate prenatal care  Plan: 1) Avoid alcoholic beverages. 2) Patient encouraged not to smoke.  3) Discontinue the use of all non-medicinal drugs and chemicals.  4) Take prenatal vitamins daily.  5) Nutrition, food safety (fish, cheese advisories, and high nitrite foods) and exercise discussed. 6) Hospital and practice style discussed with cross coverage system.  7) Genetic Screening, such as with 1st Trimester Screening, cell free fetal DNA, AFP testing, and Ultrasound, as well as with amniocentesis and CVS as appropriate, is discussed with patient. At the conclusion of today's visit patient requested genetic testing 8) Patient is asked about travel to areas at risk for the Zika virus, and counseled to avoid travel and exposure to mosquitoes or sexual partners who may have themselves been exposed to the virus. Testing is discussed, and will be ordered as appropriate.   We discussed breastfeeding today. Her nipples are inverted, but I encouraged her on how to optimize her production starting in the hospital.  RTC in 4 weeks for ROB. Her labs were reviewed today. She is happy to be havng a boy.  Mirna Mires, CNM  06/10/2022 10:31 AM

## 2022-06-14 LAB — CERVICOVAGINAL ANCILLARY ONLY
Bacterial Vaginitis (gardnerella): NEGATIVE
Candida Glabrata: NEGATIVE
Candida Vaginitis: NEGATIVE
Chlamydia: NEGATIVE
Comment: NEGATIVE
Comment: NEGATIVE
Comment: NEGATIVE
Comment: NEGATIVE
Comment: NEGATIVE
Comment: NORMAL
Neisseria Gonorrhea: NEGATIVE
Trichomonas: NEGATIVE

## 2022-06-21 ENCOUNTER — Ambulatory Visit (INDEPENDENT_AMBULATORY_CARE_PROVIDER_SITE_OTHER): Payer: Medicaid Other | Admitting: Advanced Practice Midwife

## 2022-06-21 VITALS — BP 120/80 | Wt 159.0 lb

## 2022-06-21 DIAGNOSIS — O26899 Other specified pregnancy related conditions, unspecified trimester: Secondary | ICD-10-CM | POA: Diagnosis not present

## 2022-06-21 DIAGNOSIS — Z3A12 12 weeks gestation of pregnancy: Secondary | ICD-10-CM

## 2022-06-21 DIAGNOSIS — O99891 Other specified diseases and conditions complicating pregnancy: Secondary | ICD-10-CM

## 2022-06-21 DIAGNOSIS — R109 Unspecified abdominal pain: Secondary | ICD-10-CM

## 2022-06-21 DIAGNOSIS — M549 Dorsalgia, unspecified: Secondary | ICD-10-CM

## 2022-06-21 LAB — POCT URINALYSIS DIPSTICK
Blood, UA: NEGATIVE
Glucose, UA: NEGATIVE
Nitrite, UA: NEGATIVE
Protein, UA: NEGATIVE
Spec Grav, UA: 1.01 (ref 1.010–1.025)
Urobilinogen, UA: 1 E.U./dL
pH, UA: 6.5 (ref 5.0–8.0)

## 2022-06-21 MED ORDER — CEPHALEXIN 500 MG PO CAPS: 500.0000 mg | ORAL_CAPSULE | Freq: Four times a day (QID) | ORAL | 0 refills | Status: DC

## 2022-06-21 NOTE — Progress Notes (Signed)
Routine Prenatal Care Visit  Subjective  Madison Dixon is a 26 y.o. G3P1011 at [redacted]w[redacted]d being seen today for ongoing prenatal care.  She is currently monitored for the following issues for this low-risk pregnancy and has Allergic rhinitis; Chronic pelvic pain in female; Endometriosis determined by laparoscopy; BV (bacterial vaginosis); and Supervision of other normal pregnancy, antepartum on their problem list.  ----------------------------------------------------------------------------------- Patient reports back and pelvic cramping pain that started yesterday and worsening today. She denies burning or frequency of urination. She denies vaginal concerns/bleeding.    .  .   [name] denies.  ----------------------------------------------------------------------------------- The following portions of the patient's history were reviewed and updated as appropriate: allergies, current medications, past family history, past medical history, past social history, past surgical history and problem list. Problem list updated.  Objective  Blood pressure 120/80, weight 159 lb (72.1 kg), last menstrual period 03/20/2022 Pregravid weight 150 lb (68 kg) Total Weight Gain 9 lb (4.082 kg)   Urinalysis: Urine Protein    Urine Glucose     Latest Reference Range & Units 06/21/22 15:12  Glucose Negative  Negative  Leukocytes,UA Negative  4+ !  Nitrite, UA  negative  pH, UA 5.0 - 8.0  6.5  Protein,UA Negative  Negative  Specific Gravity, UA 1.010 - 1.025  1.010  Urobilinogen, UA 0.2 or 1.0 E.U./dL 1.0  RBC, UA  negative  !: Data is abnormal   Fetal Status: Fetal Heart Rate (bpm): 163         General:  Alert, oriented and cooperative. Patient is in no acute distress.  Skin: Skin is warm and dry. No rash noted.   Cardiovascular: Normal heart rate noted  Respiratory: Normal respiratory effort, no problems with respiration noted  Abdomen: Soft, gravid, appropriate for gestational age. Pain/Pressure:  Present     Pelvic:  Cervical exam deferred        Extremities: Normal range of motion.     Mental Status: Normal mood and affect. Normal behavior. Normal judgment and thought content.   Assessment   26 y.o. G3P1011 at [redacted]w[redacted]d by  12/31/2022, by Ultrasound presenting for work-in prenatal visit  Plan   Third Problems (from 05/10/22 to present)    No problems associated with this episode.    Rx Keflex for presumed UTI Return for worsening symptoms   Preterm labor symptoms and general obstetric precautions including but not limited to vaginal bleeding, contractions, leaking of fluid and fetal movement were reviewed in detail with the patient. Please refer to After Visit Summary for other counseling recommendations.   Return for scheduled visit.  Tresea Mall, CNM 06/21/2022 3:59 PM

## 2022-06-21 NOTE — Patient Instructions (Signed)
Pregnancy and Urinary Tract Infection ? ?A urinary tract infection (UTI) is an infection of any part of the urinary tract. This includes the kidneys, the tubes that connect the kidneys to the bladder (ureters), the bladder, and the tube that carries urine out of the body (urethra). These organs make, store, and get rid of urine in the body. Your health care provider may use other names to describe the infection. An upper UTI affects the ureters and kidneys (pyelonephritis). A lower UTI affects the bladder (cystitis) and urethra (urethritis). ?Most UTIs are caused by bacteria in the genital area, around the entrance to the urinary tract. These bacteria grow and cause irritation and inflammation of the urinary tract. You are more likely to develop a UTI during pregnancy because: ?The physical and hormonal changes that your body goes through make it easier for bacteria to get into your urinary tract. ?Your growing baby puts pressure on your bladder and can affect urine flow. ?Pregnant women with diabetes are at an increased risk for developing a UTI. It is important to recognize and treat UTIs in pregnancy because they can cause serious complications for both you and your baby. ?How does this affect me? ?Symptoms of a UTI include: ?Needing to urinate right away (urgently) and often, even if urinating a small amount. ?Pain, burning, or having a hard time passing urine. ?Blood in the urine. ?Unusual, cloudy, and bad-smelling urine. ?Pain in the abdomen or lower back. ?Vaginal discharge. ?You may also have: ?Vomiting or a decreased appetite. ?Confusion. ?Irritability or tiredness. ?A fever. ?Diarrhea. ?A low level of red blood cells (anemia). ?The development of high blood pressure during pregnancy (preeclampsia). ?How does this affect my baby? ?An untreated UTI during pregnancy could lead to a kidney infection or an infection throughout the mother's body (systemic infection). This can cause health problems and affect the  baby. Possible complications of an untreated UTI include: ?Your baby being born before 37 weeks of pregnancy (premature). ?Your baby being born with a low birth weight. ?Your baby having a higher risk of having his or her skin or the white parts of the eyes turn yellow (jaundice). ?What can I do to lower my risk? ?To prevent a UTI: ?Do not hold urine for long periods of time. Empty your bladder as soon as you feel the urge. ?Always wipe from front to back, especially after a bowel movement. Use each tissue one time when you wipe. ?Empty your bladder after sex. ?Keep your genital area dry. ?Drink 6 to 8 glasses of water each day. ?Do not douche or use deodorant sprays. ?Wear cotton underwear and loose clothing. ?How is this treated? ?Treatment for this condition may include: ?Antibiotic medicines that are safe to take during pregnancy. ?Other medicines to treat less common causes of UTI. ?Follow these instructions at home: ?If you were prescribed an antibiotic medicine, take it as told by your health care provider. Do not stop using the antibiotic even if you start to feel better. ?Keep all follow-up visits. This is important. ?Contact a health care provider if: ?Your symptoms do not improve or they get worse. ?You have abnormal vaginal discharge. ?Get help right away if you: ?Have a fever. ?Have nausea and vomiting. ?Have back or side pain. ?Have lower belly pain, tightness, or feel contractions in your uterus. ?Have a gush of fluid from your vagina. ?Have blood in your urine. ?Summary ?A UTI is an infection of any part of the urinary tract, which includes the kidneys, ureters, bladder,   and urethra. ?Most urinary tract infections are caused by bacteria in your genital area, around the entrance to your urinary tract (urethra). ?You are more likely to develop a UTI during pregnancy. It is important to recognize and treat UTIs in pregnancy because of the risk of serious complications for both you and your baby. ?If you  were prescribed an antibiotic medicine, take it as told by your health care provider. Do not stop using the antibiotic even if you start to feel better. ?This information is not intended to replace advice given to you by your health care provider. Make sure you discuss any questions you have with your health care provider. ?Document Revised: 05/27/2021 Document Reviewed: 05/27/2021 ?Elsevier Patient Education ? 2023 Elsevier Inc. ? ?

## 2022-06-23 LAB — URINE CULTURE

## 2022-06-25 DIAGNOSIS — Z419 Encounter for procedure for purposes other than remedying health state, unspecified: Secondary | ICD-10-CM | POA: Diagnosis not present

## 2022-07-07 ENCOUNTER — Ambulatory Visit (INDEPENDENT_AMBULATORY_CARE_PROVIDER_SITE_OTHER): Payer: Medicaid Other | Admitting: Obstetrics

## 2022-07-07 VITALS — BP 116/80 | Wt 162.0 lb

## 2022-07-07 DIAGNOSIS — Z3A14 14 weeks gestation of pregnancy: Secondary | ICD-10-CM

## 2022-07-07 DIAGNOSIS — Z3480 Encounter for supervision of other normal pregnancy, unspecified trimester: Secondary | ICD-10-CM

## 2022-07-07 LAB — POCT URINALYSIS DIPSTICK
Glucose, UA: NEGATIVE
Protein, UA: NEGATIVE

## 2022-07-07 NOTE — Progress Notes (Signed)
Routine Prenatal Care Visit  Subjective  Madison Dixon is a 26 y.o. G3P1011 at [redacted]w[redacted]d being seen today for ongoing prenatal care.  She is currently monitored for the following issues for this low-risk pregnancy and has Allergic rhinitis; Chronic pelvic pain in female; Endometriosis determined by laparoscopy; BV (bacterial vaginosis); and Supervision of other normal pregnancy, antepartum on their problem list.  ----------------------------------------------------------------------------------- Patient reports no complaints.    .  .   [name] denies.  ----------------------------------------------------------------------------------- The following portions of the patient's history were reviewed and updated as appropriate: allergies, current medications, past family history, past medical history, past social history, past surgical history and problem list. Problem list updated.  Objective  Last menstrual period 03/20/2022, unknown if currently breastfeeding. Pregravid weight 150 lb (68 kg) Total Weight Gain 9 lb (4.082 kg) Urinalysis: Urine Protein    Urine Glucose    Fetal Status:           General:  Alert, oriented and cooperative. Patient is in no acute distress.  Skin: Skin is warm and dry. No rash noted.   Cardiovascular: Normal heart rate noted  Respiratory: Normal respiratory effort, no problems with respiration noted  Abdomen: Soft, gravid, appropriate for gestational age.       Pelvic:  Cervical exam deferred        Extremities: Normal range of motion.     Mental Status: Normal mood and affect. Normal behavior. Normal judgment and thought content.   Assessment   25 y.o. G3P1011 at [redacted]w[redacted]d by  12/31/2022, by Ultrasound presenting for routine prenatal visit  Plan   Third Problems (from 05/10/22 to present)    No problems associated with this episode.       Preterm labor symptoms and general obstetric precautions including but not limited to vaginal bleeding,  contractions, leaking of fluid and fetal movement were reviewed in detail with the patient. Please refer to After Visit Summary for other counseling recommendations.  I have ordered her anatomy scan for one month out.  Return in about 4 weeks (around 08/04/2022) for return OB, check on anatomy scan.  Mirna Mires, CNM  07/07/2022 1:24 PM

## 2022-07-07 NOTE — Progress Notes (Signed)
No vb. No lof.  

## 2022-07-25 DIAGNOSIS — Z419 Encounter for procedure for purposes other than remedying health state, unspecified: Secondary | ICD-10-CM | POA: Diagnosis not present

## 2022-08-03 ENCOUNTER — Ambulatory Visit
Admission: RE | Admit: 2022-08-03 | Discharge: 2022-08-03 | Disposition: A | Discharge status: Home / Self Care | Payer: Medicaid Other | Source: Ambulatory Visit | Attending: Obstetrics | Admitting: Obstetrics

## 2022-08-03 DIAGNOSIS — Z3492 Encounter for supervision of normal pregnancy, unspecified, second trimester: Secondary | ICD-10-CM | POA: Diagnosis not present

## 2022-08-03 DIAGNOSIS — Z369 Encounter for antenatal screening, unspecified: Secondary | ICD-10-CM | POA: Diagnosis not present

## 2022-08-03 DIAGNOSIS — Z3482 Encounter for supervision of other normal pregnancy, second trimester: Secondary | ICD-10-CM | POA: Diagnosis not present

## 2022-08-03 DIAGNOSIS — Z3A19 19 weeks gestation of pregnancy: Secondary | ICD-10-CM | POA: Diagnosis not present

## 2022-08-03 DIAGNOSIS — Z3480 Encounter for supervision of other normal pregnancy, unspecified trimester: Secondary | ICD-10-CM | POA: Insufficient documentation

## 2022-08-05 ENCOUNTER — Ambulatory Visit (INDEPENDENT_AMBULATORY_CARE_PROVIDER_SITE_OTHER): Payer: Medicaid Other | Admitting: Obstetrics

## 2022-08-05 VITALS — BP 108/67 | Wt 164.0 lb

## 2022-08-05 DIAGNOSIS — Z3480 Encounter for supervision of other normal pregnancy, unspecified trimester: Secondary | ICD-10-CM

## 2022-08-05 DIAGNOSIS — Z3482 Encounter for supervision of other normal pregnancy, second trimester: Secondary | ICD-10-CM

## 2022-08-05 DIAGNOSIS — Z3A18 18 weeks gestation of pregnancy: Secondary | ICD-10-CM

## 2022-08-05 LAB — POCT URINALYSIS DIPSTICK
Blood, UA: NEGATIVE
Glucose, UA: NEGATIVE
Leukocytes, UA: NEGATIVE
Protein, UA: NEGATIVE
Spec Grav, UA: 1.015 (ref 1.010–1.025)
Urobilinogen, UA: 0.2 E.U./dL
pH, UA: 5.5 (ref 5.0–8.0)

## 2022-08-05 NOTE — Addendum Note (Signed)
Addended by: Inis Sizer on: 08/05/2022 01:34 PM   Modules accepted: Orders

## 2022-08-05 NOTE — Progress Notes (Signed)
No vb. No lof. Anatomy u/s 2 days ago

## 2022-08-05 NOTE — Progress Notes (Signed)
Routine Prenatal Care Visit  Subjective  Madison Dixon is a 26 y.o. G3P1011 at [redacted]w[redacted]d being seen today for ongoing prenatal care.  She is currently monitored for the following issues for this low-risk pregnancy and has Allergic rhinitis; Chronic pelvic pain in female; Endometriosis determined by laparoscopy; BV (bacterial vaginosis); and Supervision of other normal pregnancy, antepartum on their problem list.  ----------------------------------------------------------------------------------- Patient reports no complaints.  She thinks that she is naming her son Baldwin  . Vag. Bleeding: None.   . Leaking Fluid denies.  ----------------------------------------------------------------------------------- The following portions of the patient's history were reviewed and updated as appropriate: allergies, current medications, past family history, past medical history, past social history, past surgical history and problem list. Problem list updated.  Objective  Blood pressure 108/67, weight 74.4 kg, last menstrual period 03/20/2022, unknown if currently breastfeeding. Pregravid weight 68 kg Total Weight Gain 6.35 kg Urinalysis: Urine Protein    Urine Glucose    Fetal Status:           General:  Alert, oriented and cooperative. Patient is in no acute distress.  Skin: Skin is warm and dry. No rash noted.   Cardiovascular: Normal heart rate noted  Respiratory: Normal respiratory effort, no problems with respiration noted  Abdomen: Soft, gravid, appropriate for gestational age. Pain/Pressure: Absent     Pelvic:  Cervical exam deferred        Extremities: Normal range of motion.     Mental Status: Normal mood and affect. Normal behavior. Normal judgment and thought content.   Assessment   26 y.o. G3P1011 at [redacted]w[redacted]d by  12/31/2022, by Ultrasound presenting for routine prenatal visit  Plan   Third Problems (from 05/10/22 to present)    No problems associated with this episode.        Preterm labor symptoms and general obstetric precautions including but not limited to vaginal bleeding, contractions, leaking of fluid and fetal movement were reviewed in detail with the patient. Please refer to After Visit Summary for other counseling recommendations.   Return in about 4 weeks (around 09/02/2022) for return OB.  Imagene Riches, CNM  08/05/2022 1:18 PM

## 2022-08-25 DIAGNOSIS — Z419 Encounter for procedure for purposes other than remedying health state, unspecified: Secondary | ICD-10-CM | POA: Diagnosis not present

## 2022-09-02 ENCOUNTER — Encounter: Payer: Self-pay | Admitting: Obstetrics and Gynecology

## 2022-09-02 ENCOUNTER — Ambulatory Visit (INDEPENDENT_AMBULATORY_CARE_PROVIDER_SITE_OTHER): Payer: Medicaid Other | Admitting: Obstetrics and Gynecology

## 2022-09-02 VITALS — BP 121/68 | HR 91

## 2022-09-02 DIAGNOSIS — Z3482 Encounter for supervision of other normal pregnancy, second trimester: Secondary | ICD-10-CM

## 2022-09-02 DIAGNOSIS — Z3A22 22 weeks gestation of pregnancy: Secondary | ICD-10-CM

## 2022-09-02 LAB — POCT URINALYSIS DIPSTICK OB
Bilirubin, UA: NEGATIVE
Blood, UA: NEGATIVE
Glucose, UA: NEGATIVE
Ketones, UA: NEGATIVE
Leukocytes, UA: NEGATIVE
Nitrite, UA: NEGATIVE
POC,PROTEIN,UA: NEGATIVE
Spec Grav, UA: 1.015 (ref 1.010–1.025)
Urobilinogen, UA: 0.2 E.U./dL
pH, UA: 6 (ref 5.0–8.0)

## 2022-09-02 NOTE — Progress Notes (Signed)
ROB: Patient with some pelvic pressure after a long day at work on her feet-but otherwise feels well.  No urinary symptoms.  Reports daily fetal movement.  Anatomy ultrasound and complete.  1 hour GCT next visit.

## 2022-09-02 NOTE — Progress Notes (Signed)
ROB. Patient states she has "a lot of pressure" in her pelvic bone specially notices it  after a long day of work.Patient states no other questions or concerns at this time.

## 2022-09-20 ENCOUNTER — Telehealth: Payer: Self-pay

## 2022-09-20 MED ORDER — BUTALBITAL-APAP-CAFFEINE 50-325-40 MG PO CAPS: 1.0000 | ORAL_CAPSULE | Freq: Four times a day (QID) | ORAL | 3 refills | Status: DC | PRN

## 2022-09-20 NOTE — Telephone Encounter (Signed)
Patient called with concerns of having frequent migraines with visual disturbances. She had an episode on Friday and she lost her vision, unable to see out of her left eye. She is [redacted]w[redacted]d. Please advise.

## 2022-09-20 NOTE — Addendum Note (Signed)
Addended by: Fabian November on: 09/20/2022 08:22 PM   Modules accepted: Orders

## 2022-09-21 ENCOUNTER — Other Ambulatory Visit: Payer: Self-pay | Admitting: Obstetrics and Gynecology

## 2022-09-21 MED ORDER — BUTALBITAL-ASPIRIN-CAFFEINE 50-325-40 MG PO CAPS: 2.0000 | ORAL_CAPSULE | Freq: Two times a day (BID) | ORAL | 0 refills | Status: DC | PRN

## 2022-09-21 NOTE — Telephone Encounter (Signed)
I believe Madison Dixon was reaching out to her, but please double check.  She can come tomorrow, but if she is still having vision loss, would recommend urgent care/ER for management today.  I did send in a prescription for Fiorcet for her migraines.

## 2022-09-21 NOTE — Telephone Encounter (Signed)
Patient is calling to follow up on message left. No opens for today, We have opening in the scheduled tomorrow. Is she ok to wait that long?

## 2022-09-21 NOTE — Telephone Encounter (Signed)
Patient is scheduled fir 09/22/22 with Dr Valentino Saxon, per [facility]

## 2022-09-22 ENCOUNTER — Encounter: Payer: Medicaid Other | Admitting: Obstetrics and Gynecology

## 2022-09-22 ENCOUNTER — Telehealth: Payer: Self-pay | Admitting: Obstetrics and Gynecology

## 2022-09-22 NOTE — Telephone Encounter (Signed)
Reached out to pt to reschedule appt with Dr. Valentino Saxon that was scheduled on 09/22/22 at 8:00.  Left message for pt to call back to reschedule.

## 2022-09-23 ENCOUNTER — Encounter: Payer: Self-pay | Admitting: Obstetrics and Gynecology

## 2022-09-23 NOTE — Telephone Encounter (Signed)
Reached out to pt (2x) to reschedule appt with Dr. Valentino Saxon that was scheduled on 11/29 at 8:00.  Left message for pt to call back to reschedule.

## 2022-09-23 NOTE — Telephone Encounter (Signed)
Patient is scheduled for 12/1 with JW

## 2022-09-24 ENCOUNTER — Encounter: Payer: Self-pay | Admitting: Medical

## 2022-09-24 ENCOUNTER — Ambulatory Visit (INDEPENDENT_AMBULATORY_CARE_PROVIDER_SITE_OTHER): Payer: Medicaid Other | Admitting: Medical

## 2022-09-24 VITALS — BP 128/84

## 2022-09-24 DIAGNOSIS — Z419 Encounter for procedure for purposes other than remedying health state, unspecified: Secondary | ICD-10-CM | POA: Diagnosis not present

## 2022-09-24 DIAGNOSIS — O99352 Diseases of the nervous system complicating pregnancy, second trimester: Secondary | ICD-10-CM

## 2022-09-24 DIAGNOSIS — Z3480 Encounter for supervision of other normal pregnancy, unspecified trimester: Secondary | ICD-10-CM

## 2022-09-24 DIAGNOSIS — G4489 Other headache syndrome: Secondary | ICD-10-CM

## 2022-09-24 DIAGNOSIS — Z3A26 26 weeks gestation of pregnancy: Secondary | ICD-10-CM

## 2022-09-24 NOTE — Progress Notes (Signed)
   PRENATAL VISIT NOTE  Subjective:  Madison Dixon is a 26 y.o. G3P1011 at [redacted]w[redacted]d being seen today for ongoing prenatal care.  She is currently monitored for the following issues for this low-risk pregnancy and has Allergic rhinitis; Chronic pelvic pain in female; Endometriosis determined by laparoscopy; and Supervision of other normal pregnancy, antepartum on their problem list.  Patient reports headache and occasional blurred vision, worse with change of positions . Headache has been present throughout pregnancy, but worsening, although mild at time, present almost constantly.  Contractions: Not present.  .  Movement: Present. Denies leaking of fluid.   The following portions of the patient's history were reviewed and updated as appropriate: allergies, current medications, past family history, past medical history, past social history, past surgical history and problem list.   Objective:   Vitals:   09/24/22 1057  BP: 128/84    Fetal Status: Fetal Heart Rate (bpm): 145 Fundal Height: 26 cm Movement: Present     General:  Alert, oriented and cooperative. Patient is in no acute distress.  Skin: Skin is warm and dry. No rash noted.   Cardiovascular: Normal heart rate noted  Respiratory: Normal respiratory effort, no problems with respiration noted  Abdomen: Soft, gravid, appropriate for gestational age.  Pain/Pressure: Absent     Pelvic: Cervical exam deferred        Extremities: Normal range of motion.  Edema: Trace  Mental Status: Normal mood and affect. Normal behavior. Normal judgment and thought content.   Assessment and Plan:  Pregnancy: G3P1011 at [redacted]w[redacted]d 1. Supervision of other normal pregnancy, antepartum - Unsure about MOC - Planning to use [facility] for Peds  2. [redacted] weeks gestation of pregnancy  3. Other headache syndrome - Ambulatory referral to Neurology - Advised that Fioricet previously prescribed is safe to try first, if no relief, can consider Excedrin migraine,  advised to send MyChart if needed  - Warning signs for worsening or more serious condition reviewed as well as when to seek emergency care - Advised increased PO hydration and slow changes of positions as this seems to aggravate her symptoms  Preterm labor symptoms and general obstetric precautions including but not limited to vaginal bleeding, contractions, leaking of fluid and fetal movement were reviewed in detail with the patient. Please refer to After Visit Summary for other counseling recommendations.   Return in about 2 weeks (around 10/08/2022) for LOB, 28 week labs, any provider.  Future Appointments  Date Time Provider Department Center  09/30/2022 10:00 AM [facility] None  09/30/2022 10:45 AM Hildred Laser, MD [facility] None    Vonzella Nipple, PA-C

## 2022-09-24 NOTE — Progress Notes (Signed)
Pt having consistent HA's with vision changes, blurry vision, dizziness, seeing spots. Tylenol not helping HA's. Pt has never had HA's like this before. Started a few weeks ago. Pt has not tried the medication yet, wanted to be seen first.

## 2022-09-29 ENCOUNTER — Other Ambulatory Visit: Payer: Self-pay

## 2022-09-29 DIAGNOSIS — Z113 Encounter for screening for infections with a predominantly sexual mode of transmission: Secondary | ICD-10-CM

## 2022-09-29 DIAGNOSIS — Z3A28 28 weeks gestation of pregnancy: Secondary | ICD-10-CM

## 2022-09-29 DIAGNOSIS — Z131 Encounter for screening for diabetes mellitus: Secondary | ICD-10-CM

## 2022-09-30 ENCOUNTER — Ambulatory Visit (INDEPENDENT_AMBULATORY_CARE_PROVIDER_SITE_OTHER): Payer: Medicaid Other | Admitting: Obstetrics and Gynecology

## 2022-09-30 ENCOUNTER — Encounter: Payer: Self-pay | Admitting: Obstetrics and Gynecology

## 2022-09-30 ENCOUNTER — Other Ambulatory Visit: Payer: Medicaid Other

## 2022-09-30 VITALS — BP 105/54 | HR 96 | Wt 174.6 lb

## 2022-09-30 DIAGNOSIS — Z23 Encounter for immunization: Secondary | ICD-10-CM

## 2022-09-30 DIAGNOSIS — Z131 Encounter for screening for diabetes mellitus: Secondary | ICD-10-CM | POA: Diagnosis not present

## 2022-09-30 DIAGNOSIS — G4489 Other headache syndrome: Secondary | ICD-10-CM

## 2022-09-30 DIAGNOSIS — Z113 Encounter for screening for infections with a predominantly sexual mode of transmission: Secondary | ICD-10-CM | POA: Diagnosis not present

## 2022-09-30 DIAGNOSIS — Z3482 Encounter for supervision of other normal pregnancy, second trimester: Secondary | ICD-10-CM

## 2022-09-30 DIAGNOSIS — Z3A28 28 weeks gestation of pregnancy: Secondary | ICD-10-CM | POA: Diagnosis not present

## 2022-09-30 DIAGNOSIS — Z3480 Encounter for supervision of other normal pregnancy, unspecified trimester: Secondary | ICD-10-CM

## 2022-09-30 DIAGNOSIS — O99352 Diseases of the nervous system complicating pregnancy, second trimester: Secondary | ICD-10-CM

## 2022-09-30 DIAGNOSIS — Z3A26 26 weeks gestation of pregnancy: Secondary | ICD-10-CM

## 2022-09-30 LAB — POCT URINALYSIS DIPSTICK OB
Bilirubin, UA: NEGATIVE
Glucose, UA: NEGATIVE
Ketones, UA: NEGATIVE
Nitrite, UA: NEGATIVE
Spec Grav, UA: 1.025 (ref 1.010–1.025)
Urobilinogen, UA: 0.2 E.U./dL
pH, UA: 6.5 (ref 5.0–8.0)

## 2022-09-30 NOTE — Patient Instructions (Signed)
Tdap (Tetanus, Diphtheria, Pertussis) Vaccine: What You Need to Know 1. Why get vaccinated? Tdap vaccine can prevent tetanus, diphtheria, and pertussis. Diphtheria and pertussis spread from person to person. Tetanus enters the body through cuts or wounds. TETANUS (T) causes painful stiffening of the muscles. Tetanus can lead to serious health problems, including being unable to open the mouth, having trouble swallowing and breathing, or death. DIPHTHERIA (D) can lead to difficulty breathing, heart failure, paralysis, or death. PERTUSSIS (aP), also known as "whooping cough," can cause uncontrollable, violent coughing that makes it hard to breathe, eat, or drink. Pertussis can be extremely serious especially in babies and young children, causing pneumonia, convulsions, brain damage, or death. In teens and adults, it can cause weight loss, loss of bladder control, passing out, and rib fractures from severe coughing. 2. Tdap vaccine Tdap is only for children 7 years and older, adolescents, and adults.  Adolescents should receive a single dose of Tdap, preferably at age 10 or 71 years. Pregnant people should get a dose of Tdap during every pregnancy, preferably during the early part of the third trimester, to help protect the newborn from pertussis. Infants are most at risk for severe, life-threatening complications from pertussis. Adults who have never received Tdap should get a dose of Tdap. Also, adults should receive a booster dose of either Tdap or Td (a different vaccine that protects against tetanus and diphtheria but not pertussis) every 10 years, or after 5 years in the case of a severe or dirty wound or burn. Tdap may be given at the same time as other vaccines. 3. Talk with your health care provider Tell your vaccine provider if the person getting the vaccine: Has had an allergic reaction after a previous dose of any vaccine that protects against tetanus, diphtheria, or pertussis, or has any  severe, life-threatening allergies Has had a coma, decreased level of consciousness, or prolonged seizures within 7 days after a previous dose of any pertussis vaccine (DTP, DTaP, or Tdap) Has seizures or another nervous system problem Has ever had Guillain-Barr Syndrome (also called "GBS") Has had severe pain or swelling after a previous dose of any vaccine that protects against tetanus or diphtheria In some cases, your health care provider may decide to postpone Tdap vaccination until a future visit. People with minor illnesses, such as a cold, may be vaccinated. People who are moderately or severely ill should usually wait until they recover before getting Tdap vaccine.  Your health care provider can give you more information. 4. Risks of a vaccine reaction Pain, redness, or swelling where the shot was given, mild fever, headache, feeling tired, and nausea, vomiting, diarrhea, or stomachache sometimes happen after Tdap vaccination. People sometimes faint after medical procedures, including vaccination. Tell your provider if you feel dizzy or have vision changes or ringing in the ears.  As with any medicine, there is a very remote chance of a vaccine causing a severe allergic reaction, other serious injury, or death. 5. What if there is a serious problem? An allergic reaction could occur after the vaccinated person leaves the clinic. If you see signs of a severe allergic reaction (hives, swelling of the face and throat, difficulty breathing, a fast heartbeat, dizziness, or weakness), call 9-1-1 and get the person to the nearest hospital. For other signs that concern you, call your health care provider.  Adverse reactions should be reported to the Vaccine Adverse Event Reporting System (VAERS). Your health care provider will usually file this report, or you  can do it yourself. Visit the VAERS website at www.vaers.hhs.gov or call 1-800-822-7967. VAERS is only for reporting reactions, and VAERS staff  members do not give medical advice. 6. The National Vaccine Injury Compensation Program The National Vaccine Injury Compensation Program (VICP) is a federal program that was created to compensate people who may have been injured by certain vaccines. Claims regarding alleged injury or death due to vaccination have a time limit for filing, which may be as short as two years. Visit the VICP website at www.hrsa.gov/vaccinecompensation or call 1-800-338-2382 to learn about the program and about filing a claim. 7. How can I learn more? Ask your health care provider. Call your local or state health department. Visit the website of the Food and Drug Administration (FDA) for vaccine package inserts and additional information at www.fda.gov/vaccines-blood-biologics/vaccines. Contact the Centers for Disease Control and Prevention (CDC): Call 1-800-232-4636 (1-800-CDC-INFO) or Visit CDC's website at www.cdc.gov/vaccines. Source: CDC Vaccine Information Statement Tdap (Tetanus, Diphtheria, Pertussis) Vaccine (05/30/2020) This same material is available at www.cdc.gov for no charge. This information is not intended to replace advice given to you by your health care provider. Make sure you discuss any questions you have with your health care provider. Document Revised: 09/08/2021 Document Reviewed: 07/13/2021 Elsevier Patient Education  2023 Elsevier Inc.  

## 2022-09-30 NOTE — Progress Notes (Signed)
ROB: Madison Dixon is a 26 y.o. G3P1011 at 107w6d. Still noting headaches.  Has not been able to pick up Fioricet due to unavailability at pharmacy. Can also utilize Excedrin Migraine if needed OTC. Discussed other causes of allergies such as allergies/sinus pressure. Discussed use of nasal saline spray, natural remedies (as patient notes she does not desire to take a lot of medication in pregnancy) since she is noting congestion x 2 months. Also encouraged f/u with Optometrist for visual disturbances, notes she has not had an eye exam in quite some time. Has referral for Neurology. For 28 week labs today.  Plans to breastfeed, desires Undecided method for contraception. For Tdap today, signed blood consent.  RTC in 2 weeks.

## 2022-09-30 NOTE — Progress Notes (Signed)
ROB  26.6w: She is doing well. She continues to have headaches, without visual disturbances. She reports good fetal movement. TDAP and BTC done.

## 2022-10-01 LAB — 28 WEEK RH+PANEL
Basophils Absolute: 0 10*3/uL (ref 0.0–0.2)
Basos: 0 %
EOS (ABSOLUTE): 0.1 10*3/uL (ref 0.0–0.4)
Eos: 1 %
Gestational Diabetes Screen: 87 mg/dL (ref 70–139)
HIV Screen 4th Generation wRfx: NONREACTIVE
Hematocrit: 34.2 % (ref 34.0–46.6)
Hemoglobin: 11.3 g/dL (ref 11.1–15.9)
Immature Grans (Abs): 0.1 10*3/uL (ref 0.0–0.1)
Immature Granulocytes: 1 %
Lymphocytes Absolute: 1.5 10*3/uL (ref 0.7–3.1)
Lymphs: 15 %
MCH: 29.7 pg (ref 26.6–33.0)
MCHC: 33 g/dL (ref 31.5–35.7)
MCV: 90 fL (ref 79–97)
Monocytes Absolute: 0.5 10*3/uL (ref 0.1–0.9)
Monocytes: 5 %
Neutrophils Absolute: 7.6 10*3/uL — ABNORMAL HIGH (ref 1.4–7.0)
Neutrophils: 78 %
Platelets: 226 10*3/uL (ref 150–450)
RBC: 3.81 x10E6/uL (ref 3.77–5.28)
RDW: 11.9 % (ref 11.7–15.4)
RPR Ser Ql: NONREACTIVE
WBC: 9.8 10*3/uL (ref 3.4–10.8)

## 2022-10-04 ENCOUNTER — Encounter: Payer: Self-pay | Admitting: Obstetrics and Gynecology

## 2022-10-14 ENCOUNTER — Ambulatory Visit (INDEPENDENT_AMBULATORY_CARE_PROVIDER_SITE_OTHER): Payer: Medicaid Other | Admitting: Obstetrics and Gynecology

## 2022-10-14 ENCOUNTER — Encounter: Payer: Self-pay | Admitting: Obstetrics and Gynecology

## 2022-10-14 VITALS — BP 106/65 | HR 111 | Wt 176.1 lb

## 2022-10-14 DIAGNOSIS — Z3A28 28 weeks gestation of pregnancy: Secondary | ICD-10-CM

## 2022-10-14 DIAGNOSIS — Z3482 Encounter for supervision of other normal pregnancy, second trimester: Secondary | ICD-10-CM

## 2022-10-14 LAB — POCT URINALYSIS DIPSTICK OB
Bilirubin, UA: NEGATIVE
Blood, UA: NEGATIVE
Glucose, UA: NEGATIVE
Ketones, UA: NEGATIVE
Leukocytes, UA: NEGATIVE
Nitrite, UA: NEGATIVE
Spec Grav, UA: 1.02 (ref 1.010–1.025)
Urobilinogen, UA: 0.2 E.U./dL
pH, UA: 6.5 (ref 5.0–8.0)

## 2022-10-14 NOTE — Progress Notes (Signed)
ROB: Patient is a 26 y.o. G3P1011 at [redacted]w[redacted]d without major complaints but has questions about if she will be able to know the exact size/weight of her baby closer to delivery. Also has questions regarding membrane sweeping.  Answered all questions. 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 Notice given. Normal 28 week labs.  RTC in 2 weeks.

## 2022-10-14 NOTE — Progress Notes (Signed)
ROB 28.6w: She is doing well. She reports good fetal movement. She is requesting a work note for restrictions.

## 2022-10-25 DIAGNOSIS — Z419 Encounter for procedure for purposes other than remedying health state, unspecified: Secondary | ICD-10-CM | POA: Diagnosis not present

## 2022-10-25 NOTE — L&D Delivery Note (Signed)
      Delivery Note   Madison Dixon is a 27 y.o. G3P1011 at 19w3dEstimated Date of Delivery: 12/31/22  PRE-OPERATIVE DIAGNOSIS:  1) 337w3dregnancy.    POST-OPERATIVE DIAGNOSIS:  1) 3880w3degnancy s/p Vaginal, Spontaneous    Delivery Type: Vaginal, Spontaneous    Delivery Anesthesia: None   Labor Complications:  none    ESTIMATED BLOOD LOSS: 150 ml    FINDINGS:   1) female infant, Apgar scores of    at 1 minute and    at 5 minutes and a birthweight pending, infant skin to skin    2) Nuchal cord: no  SPECIMENS:   PLACENTA:   Appearance: Intact , 3 vessel cord   Removal: Spontaneous      Disposition:  per protocol   DISPOSITION:  Infant to left in stable condition in the delivery room, with L&D personnel and mother,  NARRATIVE SUMMARY: Labor course:  [name] a [id number] 38w64w3d presented for labor management.  She progressed well in labor without pitocin.  She received no anesthesia and proceeded to complete dilation. She evidenced good maternal expulsive effort during the second stage. She went on to deliver a viable female infant. The placenta delivered without problems and was noted to be complete. A perineal and vaginal examination was performed. Episiotomy/Lacerations:  left periurethral abrasion, hemostatic not in need of repair. The patient tolerated this well.  Madison Dixon  12/20/2022 1:28 AM

## 2022-10-27 ENCOUNTER — Ambulatory Visit (INDEPENDENT_AMBULATORY_CARE_PROVIDER_SITE_OTHER): Payer: Medicaid Other | Admitting: Obstetrics and Gynecology

## 2022-10-27 ENCOUNTER — Encounter: Payer: Self-pay | Admitting: Obstetrics and Gynecology

## 2022-10-27 VITALS — BP 96/66 | HR 93 | Wt 177.0 lb

## 2022-10-27 DIAGNOSIS — Z3A30 30 weeks gestation of pregnancy: Secondary | ICD-10-CM

## 2022-10-27 DIAGNOSIS — Z3482 Encounter for supervision of other normal pregnancy, second trimester: Secondary | ICD-10-CM

## 2022-10-27 LAB — POCT URINALYSIS DIPSTICK OB
Bilirubin, UA: NEGATIVE
Blood, UA: NEGATIVE
Glucose, UA: NEGATIVE
Ketones, UA: NEGATIVE
Nitrite, UA: NEGATIVE
Spec Grav, UA: 1.01 (ref 1.010–1.025)
Urobilinogen, UA: 0.2 E.U./dL
pH, UA: 6.5 (ref 5.0–8.0)

## 2022-10-27 NOTE — Progress Notes (Signed)
ROB: Reports occasional pelvic pressure.  Reports waking up having cramping that usually resolves throughout the day.  Denies urinary symptoms.  Denies contractions.  Daily fetal movement.  Taking vitamins as directed.

## 2022-10-27 NOTE — Progress Notes (Signed)
ROB: She occasionally wakes up with some cramping but it usually resolves throughout the day.  She does have some increased pelvic pressure.  She denies contractions.  Reports daily fetal movement.  Taking vitamins as directed.

## 2022-11-10 ENCOUNTER — Ambulatory Visit (INDEPENDENT_AMBULATORY_CARE_PROVIDER_SITE_OTHER): Payer: Medicaid Other | Admitting: Obstetrics & Gynecology

## 2022-11-10 VITALS — BP 99/54 | HR 87 | Wt 178.0 lb

## 2022-11-10 DIAGNOSIS — Z3483 Encounter for supervision of other normal pregnancy, third trimester: Secondary | ICD-10-CM

## 2022-11-10 DIAGNOSIS — Z3480 Encounter for supervision of other normal pregnancy, unspecified trimester: Secondary | ICD-10-CM

## 2022-11-10 DIAGNOSIS — Z3A32 32 weeks gestation of pregnancy: Secondary | ICD-10-CM

## 2022-11-10 LAB — POCT URINALYSIS DIPSTICK OB
Bilirubin, UA: NEGATIVE
Blood, UA: NEGATIVE
Glucose, UA: NEGATIVE
Ketones, UA: NEGATIVE
Nitrite, UA: NEGATIVE
Spec Grav, UA: 1.015 (ref 1.010–1.025)
Urobilinogen, UA: 0.2 E.U./dL
pH, UA: 5 (ref 5.0–8.0)

## 2022-11-10 NOTE — Progress Notes (Signed)
   PRENATAL VISIT NOTE  Subjective:  Madison Dixon is a 27 y.o. G3P1011 at [redacted]w[redacted]d being seen today for ongoing prenatal care.  She is currently monitored for the following issues for this low-risk pregnancy and has Allergic rhinitis; Chronic pelvic pain in female; Endometriosis determined by laparoscopy; and Supervision of other normal pregnancy, antepartum on their problem list.  Patient reports no complaints.   .  .   . Denies leaking of fluid.   The following portions of the patient's history were reviewed and updated as appropriate: allergies, current medications, past family history, past medical history, past social history, past surgical history and problem list.   Objective:  There were no vitals filed for this visit.  Fetal Status:           General:  Alert, oriented and cooperative. Patient is in no acute distress.  Skin: Skin is warm and dry. No rash noted.   Cardiovascular: Normal heart rate noted  Respiratory: Normal respiratory effort, no problems with respiration noted  Abdomen: Soft, gravid, appropriate for gestational age.        Pelvic: Cervical exam deferred        Extremities: Normal range of motion.     Mental Status: Normal mood and affect. Normal behavior. Normal judgment and thought content.   FHR- 140s FH- 32 cm Assessment and Plan:  Pregnancy: G3P1011 at [redacted]w[redacted]d 1. Supervision of other normal pregnancy, antepartum   2. [redacted] weeks gestation of pregnancy She declines RSV, flu and covid vaccines  Preterm labor symptoms and general obstetric precautions including but not limited to vaginal bleeding, contractions, leaking of fluid and fetal movement were reviewed in detail with the patient. Please refer to After Visit Summary for other counseling recommendations.   Return in about 2 weeks (around 11/24/2022).  No future appointments.  Emily Filbert, MD

## 2022-11-10 NOTE — Addendum Note (Signed)
Addended by: Drenda Freeze on: 11/10/2022 02:23 PM   Modules accepted: Orders

## 2022-11-15 DIAGNOSIS — Z3482 Encounter for supervision of other normal pregnancy, second trimester: Secondary | ICD-10-CM | POA: Diagnosis not present

## 2022-11-15 DIAGNOSIS — Z3483 Encounter for supervision of other normal pregnancy, third trimester: Secondary | ICD-10-CM | POA: Diagnosis not present

## 2022-11-24 ENCOUNTER — Observation Stay
Admission: EM | Admit: 2022-11-24 | Discharge: 2022-11-24 | Disposition: A | Discharge status: Home / Self Care | Payer: Medicaid Other | Attending: Certified Nurse Midwife | Admitting: Certified Nurse Midwife

## 2022-11-24 ENCOUNTER — Encounter: Payer: Self-pay | Admitting: Obstetrics and Gynecology

## 2022-11-24 ENCOUNTER — Ambulatory Visit (INDEPENDENT_AMBULATORY_CARE_PROVIDER_SITE_OTHER): Payer: Medicaid Other | Admitting: Obstetrics

## 2022-11-24 ENCOUNTER — Other Ambulatory Visit: Payer: Self-pay

## 2022-11-24 VITALS — BP 120/72 | HR 86 | Wt 181.0 lb

## 2022-11-24 DIAGNOSIS — Z3480 Encounter for supervision of other normal pregnancy, unspecified trimester: Secondary | ICD-10-CM

## 2022-11-24 DIAGNOSIS — O4703 False labor before 37 completed weeks of gestation, third trimester: Principal | ICD-10-CM | POA: Insufficient documentation

## 2022-11-24 DIAGNOSIS — Z3A34 34 weeks gestation of pregnancy: Secondary | ICD-10-CM | POA: Diagnosis not present

## 2022-11-24 DIAGNOSIS — N390 Urinary tract infection, site not specified: Secondary | ICD-10-CM

## 2022-11-24 DIAGNOSIS — R109 Unspecified abdominal pain: Secondary | ICD-10-CM

## 2022-11-24 DIAGNOSIS — Z3685 Encounter for antenatal screening for Streptococcus B: Secondary | ICD-10-CM

## 2022-11-24 DIAGNOSIS — O479 False labor, unspecified: Secondary | ICD-10-CM

## 2022-11-24 DIAGNOSIS — O26893 Other specified pregnancy related conditions, third trimester: Secondary | ICD-10-CM | POA: Diagnosis not present

## 2022-11-24 DIAGNOSIS — O2343 Unspecified infection of urinary tract in pregnancy, third trimester: Secondary | ICD-10-CM

## 2022-11-24 LAB — POCT URINALYSIS DIPSTICK OB
Bilirubin, UA: NEGATIVE
Blood, UA: NEGATIVE
Glucose, UA: NEGATIVE
Leukocytes, UA: NEGATIVE
Nitrite, UA: NEGATIVE
POC,PROTEIN,UA: NEGATIVE
Spec Grav, UA: 1.02 (ref 1.010–1.025)
Urobilinogen, UA: 0.2 E.U./dL
pH, UA: 6 (ref 5.0–8.0)

## 2022-11-24 LAB — URINALYSIS, COMPLETE (UACMP) WITH MICROSCOPIC
Bacteria, UA: NONE SEEN
Bilirubin Urine: NEGATIVE
Glucose, UA: NEGATIVE mg/dL
Hgb urine dipstick: NEGATIVE
Ketones, ur: NEGATIVE mg/dL
Leukocytes,Ua: NEGATIVE
Nitrite: NEGATIVE
Protein, ur: NEGATIVE mg/dL
Specific Gravity, Urine: 1.012 (ref 1.005–1.030)
pH: 6 (ref 5.0–8.0)

## 2022-11-24 LAB — GROUP B STREP BY PCR: Group B strep by PCR: NEGATIVE

## 2022-11-24 MED ORDER — LACTATED RINGERS IV BOLUS
1000.0000 mL | Freq: Once | INTRAVENOUS | Status: AC
  Administered 2022-11-24: 1000 mL via INTRAVENOUS

## 2022-11-24 MED ORDER — NIFEDIPINE 10 MG PO CAPS
30.0000 mg | ORAL_CAPSULE | Freq: Once | ORAL | Status: AC
Start: 2022-11-24 — End: 2022-11-24
  Administered 2022-11-24: 30 mg via ORAL
  Filled 2022-11-24: qty 3

## 2022-11-24 MED ORDER — BETAMETHASONE SOD PHOS & ACET 6 (3-3) MG/ML IJ SUSP
12.0000 mg | Freq: Once | INTRAMUSCULAR | Status: AC
  Administered 2022-11-24: 12 mg via INTRAMUSCULAR

## 2022-11-24 MED ORDER — LACTATED RINGERS IV SOLN
INTRAVENOUS | Status: DC
Start: 2022-11-24 — End: 2022-11-25

## 2022-11-24 MED ORDER — ZOLPIDEM TARTRATE 5 MG PO TABS
ORAL_TABLET | ORAL | Status: AC
  Filled 2022-11-24: qty 1

## 2022-11-24 MED ORDER — FENTANYL CITRATE (PF) 100 MCG/2ML IJ SOLN: 50.0000 ug | INTRAMUSCULAR | Status: DC | PRN

## 2022-11-24 MED ORDER — ZOLPIDEM TARTRATE 5 MG PO TABS: 5.0000 mg | ORAL_TABLET | Freq: Every evening | ORAL | Status: DC | PRN

## 2022-11-24 MED ORDER — ACETAMINOPHEN 500 MG PO TABS
1000.0000 mg | ORAL_TABLET | Freq: Three times a day (TID) | ORAL | Status: DC | PRN
  Administered 2022-11-24: 1000 mg via ORAL
  Filled 2022-11-24: qty 2

## 2022-11-24 NOTE — OB Triage Note (Addendum)
   L&D OB Triage Note  SUBJECTIVE Madison Dixon is a 27 y.o. G54P1011 female at [redacted]w[redacted]d, EDD Estimated Date of Delivery: 12/31/22 who presented to triage from the office for prolonged monitoring due to pre term contractions since last night. She is feeling good movement, denies loss of fluid , and vaginal bleeding.   OB History  Gravida Para Term Preterm AB Living  3 1 1  0 1 1  SAB IAB Ectopic Multiple Live Births  1 0 0 0 0    # Outcome Date GA Lbr Len/2nd Weight Sex Delivery Anes PTL Lv  3 Current           2 SAB 2022     SAB     1 Term 07/20/19 [redacted]w[redacted]d  3033 g F Vag-Spont       Obstetric Comments  1st Menstrual Cycle:  11    Medications Prior to Admission  Medication Sig Dispense Refill Last Dose   Prenatal MV-Min-Fe Fum-FA-DHA (PRENATAL 1 PO) Take by mouth.        OBJECTIVE  Nursing Evaluation:   BP 133/60 (BP Location: Right Arm)   Pulse 80   Temp 97.9 F (36.6 C) (Oral)   Resp 18   Ht 5\' 2"  (1.575 m)   Wt 81.6 kg   LMP 03/20/2022 (Exact Date)   BMI 32.92 kg/m    Findings:   preterm contractions, no cervical change x 8 hrs.       NST was performed and has been reviewed by me.  NST INTERPRETATION: Category I  Mode: External Baseline Rate (A): 125 bpm Variability: Moderate Accelerations: 15 x 15 Decelerations: None     Contraction Frequency (min): 2-3  ASSESSMENT Impression:  1.  Pregnancy:  G3P1011 at 108w5d , EDD Estimated Date of Delivery: 12/31/22 2.  Reassuring fetal and maternal status 3.  Preterm contractions, no cervical change  PLAN 1. Current condition and above findings reviewed.  Reassuring fetal and maternal condition. Reviewed labor precautions 2. Discharge home with standard labor precautions given to return to L&D or call the office for problems. Follow up in the office tomorrow for second dose of betamethasone. 3. Continue routine prenatal care. 4. Dr. Amalia Hailey consulted on plan of care and is in agreement.     Philip Aspen, CNM

## 2022-11-24 NOTE — OB Triage Note (Addendum)
   L&D OB Triage Note  SUBJECTIVE Madison Dixon is a 27 y.o. G30P1011 female at [redacted]w[redacted]d, EDD Estimated Date of Delivery: 12/31/22 who presented to triage from the office with complaints of contractions that started last night and have continued today.   OB History  Gravida Para Term Preterm AB Living  3 1 1  0 1 1  SAB IAB Ectopic Multiple Live Births  1 0 0 0 0    # Outcome Date GA Lbr Len/2nd Weight Sex Delivery Anes PTL Lv  3 Current           2 SAB 2022     SAB     1 Term 07/20/19 [redacted]w[redacted]d  3033 g F Vag-Spont       Obstetric Comments  1st Menstrual Cycle:  11    Medications Prior to Admission  Medication Sig Dispense Refill Last Dose   Prenatal MV-Min-Fe Fum-FA-DHA (PRENATAL 1 PO) Take by mouth.        OBJECTIVE  Nursing Evaluation:   BP 133/60 (BP Location: Right Arm)   Pulse 80   Temp 97.9 F (36.6 C) (Oral)   Resp 18   Ht 5\' 2"  (1.575 m)   Wt 81.6 kg   LMP 03/20/2022 (Exact Date)   BMI 32.92 kg/m    Findings:   contracting q 1-2 min      NST was performed and has been reviewed by me.  NST INTERPRETATION: Category I  Mode: External Baseline Rate (A): 150 bpm Variability: Moderate Accelerations: 15 x 15 Decelerations: None     Contraction Frequency (min): 2-3  SVE 2/70/-2 , (exam from the office @ 1315 1.5/60/-1)  ASSESSMENT Impression:  1.  Pregnancy:  G3P1011 at [redacted]w[redacted]d , EDD Estimated Date of Delivery: 12/31/22 2.  Reassuring fetal and maternal status 3.  Preterm contractions  PLAN IV hydration, dose of procardia, close monitoring for labor. No significant cervical change 4 hours, will continue to monitor.   I was present and evaluated this pt at the bedside.   Philip Aspen, CNM

## 2022-11-24 NOTE — OB Triage Note (Addendum)
Patient is a 27 yo, G3P1, at 34 weeks 5 days. Patient presents to L&D after being sent over from the office for contractions and cervical dilation per [facility].  Patient denies any vaginal bleeding or LOF. Patient reports irregular contractions she describes as braxton hicks. Patient reports 4/10 vaginal/pelvic pressure. Patient reports +FM. Monitors applied and assessing. VSS. Initial fetal heart tone 150. Grandville Silos CNM notified of patients arrival to unit. Plan to place in observation for fetal monitoring, UA, IV hydration, and preterm labor eval.

## 2022-11-24 NOTE — Progress Notes (Signed)
Patient now reporting increased intensity of contractions, breathing well through contractions. [place] notified.

## 2022-11-24 NOTE — Progress Notes (Signed)
ROB at [redacted]w[redacted]d. Good fetal movement. Madison Dixon reports increasing pelvic pain and pressure that started yesterday. She had painful contractions from approximately 10 PM last night- 8 AM. She is visibly uncomfortable and reports that it is painful to sit and to move. +ketones in urine. In her prior pregnancy, she had preterm labor and received steroids but went on to give birth at term. SVE: 1.5/60/0, -1 Discussed plan of care with Dr. Amalia Hailey. Will give betamethasone in the office and then go to L&D for monitoring and IV hydration. Return to office in 24 hours for second dose of betamethasone. GBS swab self-collected. Madison Dixon is in agreement with this plan. Report called to Wyvonnia Dusky, CNM. ROB in one week.  Lurlean Horns, CNM

## 2022-11-24 NOTE — Addendum Note (Signed)
Addended by: Landis Gandy on: 11/24/2022 02:02 PM   Modules accepted: Orders

## 2022-11-25 ENCOUNTER — Encounter: Payer: Self-pay | Admitting: Obstetrics and Gynecology

## 2022-11-25 ENCOUNTER — Ambulatory Visit (INDEPENDENT_AMBULATORY_CARE_PROVIDER_SITE_OTHER): Payer: Medicaid Other | Admitting: Obstetrics and Gynecology

## 2022-11-25 ENCOUNTER — Ambulatory Visit (INDEPENDENT_AMBULATORY_CARE_PROVIDER_SITE_OTHER): Payer: Medicaid Other

## 2022-11-25 VITALS — BP 143/79 | HR 79 | Ht 62.0 in | Wt 183.0 lb

## 2022-11-25 VITALS — BP 132/86 | HR 79 | Wt 183.0 lb

## 2022-11-25 DIAGNOSIS — O479 False labor, unspecified: Secondary | ICD-10-CM

## 2022-11-25 DIAGNOSIS — Z3483 Encounter for supervision of other normal pregnancy, third trimester: Secondary | ICD-10-CM

## 2022-11-25 DIAGNOSIS — Z3480 Encounter for supervision of other normal pregnancy, unspecified trimester: Secondary | ICD-10-CM

## 2022-11-25 DIAGNOSIS — Z3A37 37 weeks gestation of pregnancy: Secondary | ICD-10-CM

## 2022-11-25 DIAGNOSIS — O26893 Other specified pregnancy related conditions, third trimester: Secondary | ICD-10-CM | POA: Diagnosis not present

## 2022-11-25 DIAGNOSIS — Z3A34 34 weeks gestation of pregnancy: Secondary | ICD-10-CM

## 2022-11-25 DIAGNOSIS — Z419 Encounter for procedure for purposes other than remedying health state, unspecified: Secondary | ICD-10-CM | POA: Diagnosis not present

## 2022-11-25 LAB — POCT URINALYSIS DIPSTICK OB
Bilirubin, UA: NEGATIVE
Glucose, UA: NEGATIVE
Leukocytes, UA: NEGATIVE
Nitrite, UA: NEGATIVE
Spec Grav, UA: 1.025 (ref 1.010–1.025)
Urobilinogen, UA: 0.2 E.U./dL
pH, UA: 7 (ref 5.0–8.0)

## 2022-11-25 MED ORDER — NIFEDIPINE ER OSMOTIC RELEASE 30 MG PO TB24: 30.0000 mg | ORAL_TABLET | Freq: Every day | ORAL | 2 refills | Status: DC

## 2022-11-25 MED ORDER — BETAMETHASONE SOD PHOS & ACET 6 (3-3) MG/ML IJ SUSP
12.0000 mg | Freq: Once | INTRAMUSCULAR | Status: AC
  Administered 2022-11-25: 12 mg via INTRAMUSCULAR

## 2022-11-25 NOTE — Progress Notes (Signed)
After obtaining consent, and per orders Merlissa swanson CNM  injection of betamethasone  given by Landis Gandy. Would like to know if she can be induced if she makes past 37 weeks. Patient stated her face was red unsure if from pain started yesterday.Spoke with provider on call. Called midwife on call stated provider in office needed to see her. Patient was seen by Dr.Evans in office was placed on his schedule.

## 2022-11-25 NOTE — Progress Notes (Signed)
ROB: Patient states that she continues to feel contractions but they are not as strong as yesterday.  She reports that she feels flushed sometimes but is otherwise doing well.  Cervix is 1.5 cm-unchanged from yesterday.  Had very low in the pelvis no current evidence of UTI.  Some urinary ketones.  We discussed increased hydration and to make sure she is eating well.  We discussed possible prophylactic use of Procardia to make her more comfortable if she desires this.  Patient advised that if something changes I would be happy to see her early next week or if something happened over the weekend to present to labor and delivery and we would reevaluate.

## 2022-11-27 LAB — URINE CULTURE, OB REFLEX

## 2022-11-27 LAB — CULTURE, OB URINE

## 2022-11-28 LAB — CULTURE, BETA STREP (GROUP B ONLY): Strep Gp B Culture: NEGATIVE

## 2022-12-03 ENCOUNTER — Ambulatory Visit (INDEPENDENT_AMBULATORY_CARE_PROVIDER_SITE_OTHER): Payer: Medicaid Other | Admitting: Certified Nurse Midwife

## 2022-12-03 VITALS — BP 119/73 | HR 83 | Wt 178.0 lb

## 2022-12-03 DIAGNOSIS — Z3A36 36 weeks gestation of pregnancy: Secondary | ICD-10-CM

## 2022-12-03 DIAGNOSIS — Z3482 Encounter for supervision of other normal pregnancy, second trimester: Secondary | ICD-10-CM

## 2022-12-03 NOTE — Progress Notes (Signed)
ROB doing well, feeling good movement. Continues to have irregular contractions. Labor precautions reviewed. Pt asked about cervical exam, Discussed that exam will likely cause more contractions so verbalizes understanding. Will wait until next week to check cervix. Discussed GBS testing and potential need to repeat the test should she not deliver in the next few weeks. She verbalizes understanding.   Follow up 1 wk or prn.

## 2022-12-03 NOTE — Addendum Note (Signed)
Addended by: Minette Headland on: 12/03/2022 10:49 AM   Modules accepted: Orders

## 2022-12-10 ENCOUNTER — Encounter: Payer: Self-pay | Admitting: Obstetrics

## 2022-12-10 ENCOUNTER — Ambulatory Visit (INDEPENDENT_AMBULATORY_CARE_PROVIDER_SITE_OTHER): Payer: Medicaid Other | Admitting: Obstetrics

## 2022-12-10 ENCOUNTER — Other Ambulatory Visit (HOSPITAL_COMMUNITY)
Admission: RE | Admit: 2022-12-10 | Discharge: 2022-12-10 | Disposition: A | Discharge status: Home / Self Care | Payer: Medicaid Other | Source: Ambulatory Visit | Attending: Obstetrics | Admitting: Obstetrics

## 2022-12-10 VITALS — BP 110/68 | HR 82 | Wt 178.8 lb

## 2022-12-10 DIAGNOSIS — Z113 Encounter for screening for infections with a predominantly sexual mode of transmission: Secondary | ICD-10-CM

## 2022-12-10 DIAGNOSIS — Z3483 Encounter for supervision of other normal pregnancy, third trimester: Secondary | ICD-10-CM

## 2022-12-10 DIAGNOSIS — Z3A37 37 weeks gestation of pregnancy: Secondary | ICD-10-CM | POA: Insufficient documentation

## 2022-12-10 DIAGNOSIS — Z3685 Encounter for antenatal screening for Streptococcus B: Secondary | ICD-10-CM | POA: Diagnosis not present

## 2022-12-10 DIAGNOSIS — Z3480 Encounter for supervision of other normal pregnancy, unspecified trimester: Secondary | ICD-10-CM | POA: Diagnosis not present

## 2022-12-10 LAB — POCT URINALYSIS DIPSTICK OB
Bilirubin, UA: NEGATIVE
Blood, UA: NEGATIVE
Glucose, UA: NEGATIVE
Ketones, UA: NEGATIVE
Leukocytes, UA: NEGATIVE
Nitrite, UA: NEGATIVE
Spec Grav, UA: 1.015 (ref 1.010–1.025)
Urobilinogen, UA: 0.2 E.U./dL
pH, UA: 6.5 (ref 5.0–8.0)

## 2022-12-10 NOTE — Progress Notes (Signed)
ROB [redacted]w[redacted]d She has increased pelvic pressure and good fetal movement.

## 2022-12-10 NOTE — Addendum Note (Signed)
Addended by: Chilton Greathouse on: 12/10/2022 11:38 AM   Modules accepted: Orders

## 2022-12-10 NOTE — Progress Notes (Signed)
Routine Prenatal Care Visit  Subjective  Madison Dixon is a 27 y.o. G3P1011 at 13w0dbeing seen today for ongoing prenatal care.  She is currently monitored for the following issues for this low-risk pregnancy and has Allergic rhinitis; Chronic pelvic pain in female; Endometriosis determined by laparoscopy; Supervision of other normal pregnancy, antepartum; and Labor and delivery, indication for care on their problem list.  ----------------------------------------------------------------------------------- Patient reports backache, occasional contractions, and and an increase in pelvic pressure. [facility]   Contractions: Irritability. Vag. Bleeding: None.  Movement: Present. Leaking Fluid denies.  ----------------------------------------------------------------------------------- The following portions of the patient's history were reviewed and updated as appropriate: allergies, current medications, past family history, past medical history, past social history, past surgical history and problem list. Problem list updated.  Objective  Blood pressure 110/68, pulse 82, weight 178 lb 12.8 oz (81.1 kg), last menstrual period 03/20/2022. Pregravid weight 150 lb (68 kg) Total Weight Gain 28 lb 12.8 oz (13.1 kg) Urinalysis: Urine Protein Trace  Urine Glucose Negative  Fetal Status:     Movement: Present     General:  Alert, oriented and cooperative. Patient is in no acute distress.  Skin: Skin is warm and dry. No rash noted.   Cardiovascular: Normal heart rate noted  Respiratory: Normal respiratory effort, no problems with respiration noted  Abdomen: Soft, gravid, appropriate for gestational age. Pain/Pressure: Present     Pelvic:   Cervix is very anterior- 3cms/60%/-3     moderately soft.    Extremities: Normal range of motion.  Edema: None  Mental Status: Normal mood and affect. Normal behavior. Normal judgment and thought content.   Assessment    27y.o. G3P1011 at 322w0dy  12/31/2022, by Ultrasound presenting for routine prenatal visit  Plan   Third Problems (from 05/10/22 to present)     No problems associated with this episode.        Term labor symptoms and general obstetric precautions including but not limited to vaginal bleeding, contractions, leaking of fluid and fetal movement were reviewed in detail with the patient. Please refer to After Visit Summary for other counseling recommendations.  She is encouraged by her SVE today. Eager for labor, and tired of the irregular contractions she has had. Much verbal encouragement given today. GBS repeated today.  Return in about 1 week (around 12/17/2022) for return OB.  MaImagene RichesCNM  12/10/2022 11:14 AM

## 2022-12-12 LAB — STREP GP B NAA: Strep Gp B NAA: NEGATIVE

## 2022-12-13 LAB — CERVICOVAGINAL ANCILLARY ONLY
Chlamydia: NEGATIVE
Comment: NEGATIVE
Comment: NORMAL
Neisseria Gonorrhea: NEGATIVE

## 2022-12-17 ENCOUNTER — Encounter: Payer: Self-pay | Admitting: Licensed Practical Nurse

## 2022-12-17 ENCOUNTER — Ambulatory Visit (INDEPENDENT_AMBULATORY_CARE_PROVIDER_SITE_OTHER): Payer: Medicaid Other | Admitting: Licensed Practical Nurse

## 2022-12-17 VITALS — BP 112/65 | HR 69 | Wt 184.8 lb

## 2022-12-17 DIAGNOSIS — Z3483 Encounter for supervision of other normal pregnancy, third trimester: Secondary | ICD-10-CM

## 2022-12-17 DIAGNOSIS — Z3A38 38 weeks gestation of pregnancy: Secondary | ICD-10-CM

## 2022-12-17 DIAGNOSIS — Z3480 Encounter for supervision of other normal pregnancy, unspecified trimester: Secondary | ICD-10-CM

## 2022-12-17 LAB — POCT URINALYSIS DIPSTICK
Bilirubin, UA: NEGATIVE
Blood, UA: NEGATIVE
Glucose, UA: NEGATIVE
Ketones, UA: NEGATIVE
Leukocytes, UA: NEGATIVE
Nitrite, UA: NEGATIVE
Protein, UA: NEGATIVE
Spec Grav, UA: 1.01 (ref 1.010–1.025)
Urobilinogen, UA: 0.2 E.U./dL
pH, UA: 6 (ref 5.0–8.0)

## 2022-12-17 NOTE — Progress Notes (Signed)
Routine Prenatal Care Visit  Subjective  JONNIE DIPPLE is a 27 y.o. G3P1011 at 71w0dbeing seen today for ongoing prenatal care.  She is currently monitored for the following issues for this low-risk pregnancy and has Allergic rhinitis; Chronic pelvic pain in female; Endometriosis determined by laparoscopy; Supervision of other normal pregnancy, antepartum; and Labor and delivery, indication for care on their problem list.  ----------------------------------------------------------------------------------- Patient reports  having contractions daily, would like to schedule an induction because the pain she is experiencing.  .  Reviewed we do not rec Induction until 41 wks. But I can sweep her membranes, pt open to a sweep.  -has a 27year old at home, her brother lives with them and will watch the child when she goes into labor Contractions: Irritability. Vag. Bleeding: None.  Movement: Present. Leaking Fluid denies.  ----------------------------------------------------------------------------------- The following portions of the patient's history were reviewed and updated as appropriate: allergies, current medications, past family history, past medical history, past social history, past surgical history and problem list. Problem list updated.  Objective  Blood pressure 112/65, pulse 69, weight 184 lb 12.8 oz (83.8 kg), last menstrual period 03/20/2022. Pregravid weight 150 lb (68 kg) Total Weight Gain 34 lb 12.8 oz (15.8 kg) Urinalysis: Urine Protein    Urine Glucose    Fetal Status: Fetal Heart Rate (bpm): 140 Fundal Height: 39 cm Movement: Present  Presentation: Vertex  General:  Alert, oriented and cooperative. Patient is in no acute distress.  Skin: Skin is warm and dry. No rash noted.   Cardiovascular: Normal heart rate noted  Respiratory: Normal respiratory effort, no problems with respiration noted  Abdomen: Soft, gravid, appropriate for gestational age. Pain/Pressure: Present      Pelvic:  Cervical exam performed Dilation: 3 Effacement (%): 50 Station: -2  Extremities: Normal range of motion.  Edema: None  Mental Status: Normal mood and affect. Normal behavior. Normal judgment and thought content.   Assessment   27y.o. G3P1011 at 373w0dy  12/31/2022, by Ultrasound presenting for routine prenatal visit  Plan   Third Problems (from 05/10/22 to present)     No problems associated with this episode.        Term labor symptoms and general obstetric precautions including but not limited to vaginal bleeding, contractions, leaking of fluid and fetal movement were reviewed in detail with the patient. Please refer to After Visit Summary for other counseling recommendations.   Return in about 1 week (around 12/24/2022) for ROMonroeLyRoberto ScalesCN[facility]led[facility]  12/17/22  1:30 PM

## 2022-12-19 ENCOUNTER — Inpatient Hospital Stay
Admission: EM | Admit: 2022-12-19 | Discharge: 2022-12-21 | DRG: 807 | Disposition: A | Discharge status: Home / Self Care | Payer: Medicaid Other | Attending: Certified Nurse Midwife | Admitting: Certified Nurse Midwife

## 2022-12-19 ENCOUNTER — Encounter: Payer: Self-pay | Admitting: Obstetrics and Gynecology

## 2022-12-19 ENCOUNTER — Other Ambulatory Visit: Payer: Self-pay

## 2022-12-19 DIAGNOSIS — O26893 Other specified pregnancy related conditions, third trimester: Secondary | ICD-10-CM | POA: Diagnosis not present

## 2022-12-19 DIAGNOSIS — Z3A38 38 weeks gestation of pregnancy: Secondary | ICD-10-CM | POA: Diagnosis not present

## 2022-12-19 LAB — TYPE AND SCREEN
ABO/RH(D): A POS
Antibody Screen: NEGATIVE

## 2022-12-19 LAB — CBC
HCT: 32.8 % — ABNORMAL LOW (ref 36.0–46.0)
Hemoglobin: 11.1 g/dL — ABNORMAL LOW (ref 12.0–15.0)
MCH: 29.1 pg (ref 26.0–34.0)
MCHC: 33.8 g/dL (ref 30.0–36.0)
MCV: 85.9 fL (ref 80.0–100.0)
Platelets: 251 10*3/uL (ref 150–400)
RBC: 3.82 MIL/uL — ABNORMAL LOW (ref 3.87–5.11)
RDW: 13.5 % (ref 11.5–15.5)
WBC: 11.4 10*3/uL — ABNORMAL HIGH (ref 4.0–10.5)
nRBC: 0 % (ref 0.0–0.2)

## 2022-12-19 MED ORDER — LACTATED RINGERS IV SOLN: 500.0000 mL | INTRAVENOUS | Status: DC | PRN

## 2022-12-19 MED ORDER — SOD CITRATE-CITRIC ACID 500-334 MG/5ML PO SOLN: 30.0000 mL | ORAL | Status: DC | PRN

## 2022-12-19 MED ORDER — LACTATED RINGERS IV SOLN: INTRAVENOUS | Status: DC

## 2022-12-19 MED ORDER — LIDOCAINE HCL (PF) 1 % IJ SOLN: 30.0000 mL | INTRAMUSCULAR | Status: DC | PRN

## 2022-12-19 MED ORDER — OXYTOCIN BOLUS FROM INFUSION
333.0000 mL | Freq: Once | INTRAVENOUS | Status: AC
  Administered 2022-12-20: 333 mL via INTRAVENOUS

## 2022-12-19 MED ORDER — ONDANSETRON HCL 4 MG/2ML IJ SOLN: 4.0000 mg | Freq: Four times a day (QID) | INTRAMUSCULAR | Status: DC | PRN

## 2022-12-19 MED ORDER — OXYTOCIN-SODIUM CHLORIDE 30-0.9 UT/500ML-% IV SOLN
2.5000 [IU]/h | INTRAVENOUS | Status: DC
  Filled 2022-12-19: qty 500

## 2022-12-19 NOTE — Progress Notes (Signed)
LABOR NOTE   Madison Dixon 27 y.o.GP@ at [redacted]w[redacted]d SUBJECTIVE:  Feeling pressure  Analgesia: Labor support without medications  OBJECTIVE:  BP 130/71   Pulse 82   Temp 98 F (36.7 C) (Oral)   Resp 19   Ht '5\' 2"'$  (1.575 m)   Wt 83.9 kg   LMP 03/20/2022 (Exact Date)   SpO2 100%   BMI 33.81 kg/m  No intake/output data recorded.  She has shown cervical change. CERVIX: 7 cm:  100%:   -1:   mid position:   soft SVE:   Dilation: 7 Effacement (%): 100 Station: -1 Exam by:: ADeneise LeverCNM CONTRACTIONS: regular, every 2-3 minutes FHR: Fetal heart tracing reviewed. Baseline: 130 bpm, Variability: Good {> 6 bpm), Accelerations: Reactive, and Decelerations: Absent Category I    Labs: Lab Results  Component Value Date   WBC 11.4 (H) 12/19/2022   HGB 11.1 (L) 12/19/2022   HCT 32.8 (L) 12/19/2022   MCV 85.9 12/19/2022   PLT 251 12/19/2022    ASSESSMENT: 1) Labor curve reviewed.       Progress: Active phase labor.     Membranes: ruptured, clear fluid           Principal Problem:   Labor and delivery, indication for care   PLAN: expectant management   APhilip Aspen CNM  12/19/2022 11:05 PM

## 2022-12-19 NOTE — OB Triage Note (Signed)
Pt co ctx since 1600 today. Denies LOF, vaginal bleeding. Dineen Kid

## 2022-12-19 NOTE — H&P (Signed)
History and Physical   HPI  Madison Dixon is a 27 y.o. G3P1011 at 81w2dEstimated Date of Delivery: 12/31/22 who is being admitted for labor management   OB History  OB History  Gravida Para Term Preterm AB Living  '3 1 1 '$ 0 1 1  SAB IAB Ectopic Multiple Live Births  1 0 0 0 0    # Outcome Date GA Lbr Len/2nd Weight Sex Delivery Anes PTL Lv  3 Current           2 SAB 2022     SAB     1 Term 07/20/19 [id number] g F Vag-Spont       Obstetric Comments  1st Menstrual Cycle:  11    PROBLEM LIST  Pregnancy complications or risks: Patient Active Problem List   Diagnosis Date Noted   Labor and delivery, indication for care 11/24/2022   Supervision of other normal pregnancy, antepartum 05/10/2022   Chronic pelvic pain in female 09/09/2016   Endometriosis determined by laparoscopy 09/09/2016   Allergic rhinitis 05/10/2008    Prenatal labs and studies: ABO, Rh: A/Positive/-- (08/11 0858) Antibody: Negative (08/11 0858) Rubella: 1.88 (08/11 0858) RPR: Non Reactive (12/07 1104)  HBsAg: Negative (08/11 0858)  HIV: Non Reactive (12/07 1104)  GBDU:[id number] (02/16 1143)   Past Medical History:  Diagnosis Date   Anxiety    Asthma    sports induced   B[place] disorder [place]   Chronic pelvic pain in female 09/09/2016   Depression    Endometriosis determined by laparoscopy 09/09/2016   Ovarian cyst    Pilonidal cyst    PONV (postoperative nausea and vomiting)      Past Surgical History:  Procedure Laterality Date   COLONOSCOPY WITH ESOPHAGOGASTRODUODENOSCOPY (EGD)     DILATION AND EVACUATION N/A 01/13/2021   Procedure: Suction D&C;  Surgeon: StMalachy MoodMD;  Location: [facility] ORS;  Service: Gynecology;  Laterality: N/A;   LAPAROSCOPY N/A 09/09/2016   Procedure: LAPAROSCOPY DIAGNOSTIC, FULGERATION OF ENDOMETRIOSIS;  Surgeon: StWill BonnetMD;  Location: [facility] ORS;  Service: Gynecology;  Laterality: N/A;   PILONIDAL CYST EXCISION N/A 04/10/2015   Procedure:  CYST EXCISION PILONIDAL EXTENSIVE;  Surgeon: SeChristene LyeMD;  Location: [facility] ORS;  Service: General;  Laterality: N/A;   WISDOM TOOTH EXTRACTION     lower two taken out;  age 27   Medications    Current Discharge Medication List     CONTINUE these medications which have NOT CHANGED   Details  Prenatal MV-Min-Fe Fum-FA-DHA (PRENATAL 1 PO) Take by mouth.         Allergies  Aripiprazole and Vicodin [hydrocodone-acetaminophen]  Review of Systems  Constitutional: negative Eyes: negative Ears, nose, mouth, throat, and face: negative Respiratory: negative Cardiovascular: negative Gastrointestinal: negative Genitourinary:negative Integument/breast: negative Hematologic/lymphatic: negative Musculoskeletal:negative Neurological: negative Behavioral/Psych: negative Endocrine: negative Allergic/Immunologic: negative  Physical Exam  BP 130/71 (BP Location: Right Arm)   Pulse 92   Temp 98.2 F (36.8 C) (Oral)   Resp 19   Ht '5\' 2"'$  (1.575 m)   Wt 83.9 kg   LMP 03/20/2022 (Exact Date)   SpO2 100%   BMI 33.81 kg/m   Lungs:  CTA B Cardio: RRR  Abd: Soft, gravid, NT Presentation: cephalic EXT: No C/C/ 1+ Edema   Per RN Exam :CERVIX: Dilation: 4 Effacement (%): 90 Cervical Position: Middle, Anterior Station: -1, -2 Presentation: Vertex Exam by:: LSE  See Prenatal records for more detailed PE.  FHR:  Baseline: 135 bpm, Variability: Good {> 6 bpm), Accelerations: Reactive, and Decelerations: Absent  Toco: Uterine Contractions: 2-4 , moderate  Test Results  No results found for this or any previous visit (from the past 24 hour(s)). Group B Strep negative  Assessment   G3P1011 at 40w2dEstimated Date of Delivery: 12/31/22  The fetus is reassuring.   Patient Active Problem List   Diagnosis Date Noted   Labor and delivery, indication for care 11/24/2022   Supervision of other normal pregnancy, antepartum 05/10/2022   Chronic pelvic pain  in female 09/09/2016   Endometriosis determined by laparoscopy 09/09/2016   Allergic rhinitis 05/10/2008    Plan  1. Admit to L&D :   2. EFM:-- Category 1 3. Stadol or Epidural if desired.  Plans unmediated birth, request [facility] 4. Admission labs  5. Dr. SOuida Sillsnotified of pt admission and plan of care  APhilip Aspen [facility] 12/19/2022 8:26 PM

## 2022-12-19 NOTE — Progress Notes (Signed)
LABOR NOTE   Madison Dixon 27 y.o.GP@ at [redacted]w[redacted]d SUBJECTIVE:  Managing well with contractions.  Analgesia: Labor support without medications  OBJECTIVE:  BP 130/71 (BP Location: Right Arm)   Pulse 92   Temp 98.2 F (36.8 C) (Oral)   Resp 19   Ht '5\' 2"'$  (1.575 m)   Wt 83.9 kg   LMP 03/20/2022 (Exact Date)   SpO2 100%   BMI 33.81 kg/m  No intake/output data recorded.  She has shown cervical change. CERVIX: 4-5 cm:  100%:   -2:   mid position:   soft SVE:   Dilation: 4.5 Effacement (%): 100 Station: -2 Exam by:: LSE CONTRACTIONS: regular, every 2-3 minutes FHR: Fetal heart tracing reviewed. Baseline: 135 bpm, Variability: Good {> 6 bpm), Accelerations: Reactive, and Decelerations: Absent Category I    Labs: Lab Results  Component Value Date   WBC 11.4 (H) 12/19/2022   HGB 11.1 (L) 12/19/2022   HCT 32.8 (L) 12/19/2022   MCV 85.9 12/19/2022   PLT 251 12/19/2022    ASSESSMENT: 1) Labor curve reviewed.       Progress: minimal      Membranes: ruptured, clear fluid     AROM , per pt request        Principal Problem:   Labor and delivery, indication for care   PLAN: continue present management  APhilip Aspen CNM  12/19/2022 9:08 PM

## 2022-12-20 ENCOUNTER — Encounter: Payer: Self-pay | Admitting: Obstetrics and Gynecology

## 2022-12-20 DIAGNOSIS — Z3A38 38 weeks gestation of pregnancy: Secondary | ICD-10-CM | POA: Diagnosis not present

## 2022-12-20 LAB — CBC
HCT: 30.8 % — ABNORMAL LOW (ref 36.0–46.0)
Hemoglobin: 10.2 g/dL — ABNORMAL LOW (ref 12.0–15.0)
MCH: 28.4 pg (ref 26.0–34.0)
MCHC: 33.1 g/dL (ref 30.0–36.0)
MCV: 85.8 fL (ref 80.0–100.0)
Platelets: 214 10*3/uL (ref 150–400)
RBC: 3.59 MIL/uL — ABNORMAL LOW (ref 3.87–5.11)
RDW: 13.4 % (ref 11.5–15.5)
WBC: 14.6 10*3/uL — ABNORMAL HIGH (ref 4.0–10.5)
nRBC: 0 % (ref 0.0–0.2)

## 2022-12-20 LAB — RPR: RPR Ser Ql: NONREACTIVE

## 2022-12-20 MED ORDER — METHYLERGONOVINE MALEATE 0.2 MG/ML IJ SOLN: 0.2000 mg | INTRAMUSCULAR | Status: DC | PRN

## 2022-12-20 MED ORDER — WITCH HAZEL-GLYCERIN EX PADS
1.0000 | MEDICATED_PAD | CUTANEOUS | Status: DC | PRN
  Administered 2022-12-20: 1 via TOPICAL
  Filled 2022-12-20: qty 100

## 2022-12-20 MED ORDER — FERROUS SULFATE 325 (65 FE) MG PO TABS
325.0000 mg | ORAL_TABLET | Freq: Every day | ORAL | Status: DC
  Administered 2022-12-20 – 2022-12-21 (×2): 325 mg via ORAL
  Filled 2022-12-20 (×2): qty 1

## 2022-12-20 MED ORDER — COCONUT OIL OIL
1.0000 | TOPICAL_OIL | Status: DC | PRN
  Filled 2022-12-20: qty 7.5

## 2022-12-20 MED ORDER — METHYLERGONOVINE MALEATE 0.2 MG PO TABS: 0.2000 mg | ORAL_TABLET | ORAL | Status: DC | PRN

## 2022-12-20 MED ORDER — PRENATAL MULTIVITAMIN CH
1.0000 | ORAL_TABLET | Freq: Every day | ORAL | Status: DC
  Administered 2022-12-20: 1 via ORAL
  Filled 2022-12-20: qty 1

## 2022-12-20 MED ORDER — SIMETHICONE 80 MG PO CHEW: 80.0000 mg | CHEWABLE_TABLET | ORAL | Status: DC | PRN

## 2022-12-20 MED ORDER — OXYCODONE HCL 5 MG PO TABS: 5.0000 mg | ORAL_TABLET | ORAL | Status: DC | PRN

## 2022-12-20 MED ORDER — IBUPROFEN 600 MG PO TABS
600.0000 mg | ORAL_TABLET | Freq: Four times a day (QID) | ORAL | Status: DC
  Administered 2022-12-20 – 2022-12-21 (×5): 600 mg via ORAL
  Filled 2022-12-20 (×5): qty 1

## 2022-12-20 MED ORDER — ONDANSETRON HCL 4 MG/2ML IJ SOLN: 4.0000 mg | INTRAMUSCULAR | Status: DC | PRN

## 2022-12-20 MED ORDER — OXYCODONE HCL 5 MG PO TABS: 10.0000 mg | ORAL_TABLET | ORAL | Status: DC | PRN

## 2022-12-20 MED ORDER — DOCUSATE SODIUM 100 MG PO CAPS: 100.0000 mg | ORAL_CAPSULE | Freq: Two times a day (BID) | ORAL | Status: DC

## 2022-12-20 MED ORDER — SENNOSIDES-DOCUSATE SODIUM 8.6-50 MG PO TABS
2.0000 | ORAL_TABLET | ORAL | Status: DC
  Administered 2022-12-20 – 2022-12-21 (×2): 2 via ORAL
  Filled 2022-12-20 (×2): qty 2

## 2022-12-20 MED ORDER — ONDANSETRON HCL 4 MG PO TABS: 4.0000 mg | ORAL_TABLET | ORAL | Status: DC | PRN

## 2022-12-20 MED ORDER — DIBUCAINE (PERIANAL) 1 % EX OINT
1.0000 | TOPICAL_OINTMENT | CUTANEOUS | Status: DC | PRN
  Administered 2022-12-20: 1 via RECTAL
  Filled 2022-12-20: qty 28

## 2022-12-20 MED ORDER — BENZOCAINE-MENTHOL 20-0.5 % EX AERO
1.0000 | INHALATION_SPRAY | CUTANEOUS | Status: DC | PRN
  Administered 2022-12-20: 1 via TOPICAL
  Filled 2022-12-20: qty 56

## 2022-12-20 NOTE — Lactation Note (Signed)
This note was copied from a baby's chart. Lactation Consultation Note  Patient Name: Madison Dixon [id number] Date: 12/20/2022 Reason for consult: Initial assessment;Early term 37-38.6wks Age:27 hours  Maternal Data Has patient been taught Hand Expression?: Yes Does the patient have breastfeeding experience prior to this delivery?: Yes How long did the patient breastfeed?: 1 month  P2, SVD 9 hours ago. Hx of Depression/Anxiety, and Bipolar. Mom has limited BF history with first child, and used a nipple shield. She has self initiated nipple shield use with this baby from the beginning. Mom does have large nipples, well rounded breasts.  Feeding Mother's Current Feeding Choice: Breast Milk  Baby has fed since delivery. Mom has attempted but baby has been sleepy.  LATCH Score Latch: Too sleepy or reluctant, no latch achieved, no sucking elicited. Mom was trying independently; baby was very sleepy and not showing any interest or hunger cues.   Lactation Tools Discussed/Used Tools: Nipple [place] Fuel;Pump Nipple shield size: 24 (mom initiated on her own due to past experiences) Breast pump type: Double-Electric Breast Pump Pump Education: Setup, frequency, and cleaning;Milk Storage Reason for Pumping: mom's request Pumping frequency: q 3 hours/post BF  Mom request set-up of DEBP since baby has been sleepy. Educated on set-up, use, and cleaning. Encouraged to use after each BF attempt; every 3 hours.  Interventions Interventions: Breast feeding basics reviewed;Hand express;Education;DEBP (Newborn feeding patterns/behaviors, early cues, feeding attempts 8-12x)  Discharge    Consult Status Consult Status: Follow-up    Lavonia Drafts 12/20/2022, 11:05 AM

## 2022-12-20 NOTE — Discharge Summary (Signed)
Postpartum Discharge Summary  Date of Service updated***     Patient Name: Madison Dixon DOB: 05-01-1996 MRN: [id number]  Date of admission: 12/19/2022 Delivery date:12/20/2022  Delivering provider: Philip Aspen  Date of discharge: *******  Admitting diagnosis: Labor and delivery, indication for care [O75.9] Intrauterine pregnancy: [redacted]w[redacted]d    Secondary diagnosis:  Principal Problem:   Labor and delivery, indication for care  Additional problems: none    Discharge diagnosis: Term Pregnancy Delivered                                              Post partum procedures:{Postpartum procedures:23558} Augmentation: AR[facility] Complications: None  Hospital course: Onset of Labor With Vaginal Delivery      27y.o. yo G3P1011 at 27w3das admitted in Active Labor on 12/19/2022. Labor course was uncomplicated.  Membrane Rupture Time/Date: 9:04 PM ,12/19/2022   Delivery Method:Vaginal, Spontaneous  Episiotomy: None  Lacerations:  Periurethral , abrasion left . Hemostatic no repair  Patient had a postpartum course complicated by ***.  She is ambulating, tolerating a regular diet, passing flatus, and urinating well. Patient is discharged home in stable condition on 12/20/22.  Newborn Data: Birth date:12/20/2022  Birth time:1:09 AM  Gender: Female  Living status:  Apgars: ,  Weight:   Magnesium Sulfate received: No BMZ received: No Rhophylac:No MMR:No T-DaP:Given prenatally Flu: {FWU:107179ransfusion:{Transfusion received:30440034}  Physical exam  Vitals:   12/19/22 2001 12/19/22 2212 12/19/22 2300 12/20/22 0125  BP: 130/71  130/65 116/87  Pulse: 82  86 100  Resp:      Temp:  98 F (36.7 C) 98 F (36.7 C)   TempSrc:  Oral Oral   SpO2:    99%  Weight:      Height:       General: {Exam; general:21111117} Lochia: {Desc; appropriate/inappropriate:30686::"appropriate"} Uterine Fundus: {Desc; firm/soft:30687} Incision: {Exam; incision:21111123} DVT Evaluation: {Exam;  dvDN:1697312abs: Lab Results  Component Value Date   WBC 11.4 (H) 12/19/2022   HGB 11.1 (L) 12/19/2022   HCT 32.8 (L) 12/19/2022   MCV 85.9 12/19/2022   PLT 251 12/19/2022      Latest Ref Rng & Units 07/29/2021    3:06 PM  CMP  Glucose 70 - 99 mg/dL 109   BUN 6 - 20 mg/dL 11   Creatinine 0.44 - 1.00 mg/dL 0.74   Sodium 135 - 145 mmol/L 137   Potassium 3.5 - 5.1 mmol/L 3.7   Chloride 98 - 111 mmol/L 107   CO2 22 - 32 mmol/L 26   Calcium 8.9 - 10.3 mg/dL 9.4    Edinburgh Score:    12/19/2022    9:00 PM  Edinburgh Postnatal Depression Scale Screening Tool  I have been able to laugh and see the funny side of things. 0  I have looked forward with enjoyment to things. 0  I have blamed myself unnecessarily when things went wrong. 0  I have been anxious or worried for no good reason. 0  I have felt scared or panicky for no good reason. 0  Things have been getting on top of me. 0  I have been so unhappy that I have had difficulty sleeping. 0  I have felt sad or miserable. 0  I have been so unhappy that I have been crying. 0  The thought of harming myself has occurred to me.  0  Edinburgh Postnatal Depression Scale Total 0      After visit meds:  Allergies as of 12/20/2022       Reactions   Aripiprazole Nausea And Vomiting   Vicodin [hydrocodone-acetaminophen] Nausea And Vomiting     Med Rec must be completed prior to using this [facility]***        Discharge home in stable condition Infant Feeding: {Baby feeding:23562} Infant Disposition:{CHL IP OB HOME WITH DX:[id number] Discharge instruction: per After Visit Summary and Postpartum booklet. Activity: Advance as tolerated. Pelvic rest for 6 weeks.  Diet: {OB diet:21111121} Anticipated Birth Control: {Birth Control:23956} Postpartum Appointment:{Outpatient follow up:23559} Additional Postpartum F/U: {PP Procedure:23957} Future Appointments: Future Appointments  Date Time Provider [facility]  12/24/2022  11:30 AM Harlin Heys, MD [facility] None   Follow up Visit:      12/20/2022 Philip Aspen, CNM

## 2022-12-21 MED ORDER — IBUPROFEN 600 MG PO TABS: 600.0000 mg | ORAL_TABLET | Freq: Four times a day (QID) | ORAL | 0 refills | Status: DC | PRN

## 2022-12-21 MED ORDER — VARICELLA VIRUS VACCINE LIVE 1350 PFU/0.5ML IJ SUSR
0.5000 mL | Freq: Once | INTRAMUSCULAR | Status: DC
  Filled 2022-12-21 (×2): qty 0.5

## 2022-12-21 NOTE — Discharge Instructions (Signed)

## 2022-12-21 NOTE — Progress Notes (Signed)
Pt discharged with infant.  Discharge instructions, prescriptions and follow up appointment given to and reviewed with pt. Pt verbalized understanding. Escorted out by auxillary. 

## 2022-12-21 NOTE — Lactation Note (Signed)
This note was copied from a baby's chart. Lactation Consultation Note  Patient Name: Madison Dixon [id number] Date: 12/21/2022 Age:27 hours Reason for consult: Follow-up assessment;Early term 37-38.6wks   Maternal Data This is mom's 2nd baby, SVD. Mom with history of anxiety, depression, and bipolar disorder.  On follow-up today mom reports she is pumping and bottle feeding at this time. Per mom baby would not stay latched when she attempted latching with the nipple shield. Mom is pumping every 3 hours and was concerned she isn't pumping as much as she had been pumping.  Has patient been taught Hand Expression?: Yes Does the patient have breastfeeding experience prior to this delivery?: Yes How long did the patient breastfeed?: 1 month  Feeding Mother's Current Feeding Choice: Breast Milk and Formula Nipple Type: Slow - flow Mom will continue to bottle feed any expressed breastmilk and/or the formula supplement(at least 8 feeds /24 hours) and pump to establish her milk supply.  Lactation Tools Discussed/Used  Provided tips and strategies to maximize milk production with mom's personal use pump, Spectra.  Interventions Interventions: Education Reviewed with mom how the body knows to make milk and encouraged mom to continue pumping at least 8 times in 24 hours.Discussed with mom outpatient lactation consult once her milk has transitioned in if baby will not latch with the nipple shield and/or mom would like additional breastfeeding support once she goes home.  Discharge Discharge Education: Engorgement and breast care;Warning signs for feeding baby;Outpatient recommendation Pump: Personal (Has Spectra pump)  Consult Status Consult Status: Complete Date: 12/21/22 Follow-up type: In-patient  Update provided to care nurse.  Jonna Shayli Altemose 12/21/2022, 12:04 PM

## 2022-12-24 ENCOUNTER — Encounter: Payer: Medicaid Other | Admitting: Obstetrics and Gynecology

## 2022-12-24 ENCOUNTER — Telehealth: Payer: Self-pay

## 2022-12-24 DIAGNOSIS — Z419 Encounter for procedure for purposes other than remedying health state, unspecified: Secondary | ICD-10-CM | POA: Diagnosis not present

## 2022-12-24 NOTE — Telephone Encounter (Signed)
Called Madison Dixon to see if she was able to get the correct forms to be filled out for her FMLA. Left voicemail.

## 2023-01-11 ENCOUNTER — Telehealth (INDEPENDENT_AMBULATORY_CARE_PROVIDER_SITE_OTHER): Payer: Medicaid Other | Admitting: Certified Nurse Midwife

## 2023-01-11 ENCOUNTER — Encounter: Payer: Self-pay | Admitting: Certified Nurse Midwife

## 2023-01-11 DIAGNOSIS — Z1332 Encounter for screening for maternal depression: Secondary | ICD-10-CM | POA: Diagnosis not present

## 2023-01-11 DIAGNOSIS — Z1331 Encounter for screening for depression: Secondary | ICD-10-CM

## 2023-01-11 NOTE — Progress Notes (Signed)
Edinburgh Postnatal Depression Scale - 01/11/23 1151       Edinburgh Postnatal Depression Scale:  In the Past 7 Days   I have been able to laugh and see the funny side of things. 0    I have looked forward with enjoyment to things. 0    I have blamed myself unnecessarily when things went wrong. 0    I have been anxious or worried for no good reason. 1    I have felt scared or panicky for no good reason. 0    Things have been getting on top of me. 0    I have been so unhappy that I have had difficulty sleeping. 0    I have felt sad or miserable. 0    I have been so unhappy that I have been crying. 0    The thought of harming myself has occurred to me. 0    Edinburgh Postnatal Depression Scale Total 1

## 2023-01-11 NOTE — Progress Notes (Signed)
Virtual Visit via Video Note  I connected with Madison Dixon on 01/11/23 at  1:15 PM EDT by a video enabled telemedicine application and verified that I am speaking with the correct person using two identifiers.  Location: Patient: at home  Provider: at work    I discussed the limitations of evaluation and management by telemedicine and the availability of in person appointments. The patient expressed understanding and agreed to proceed.  History of Present Illness: Status post SVD x 2 wks    Observations/Objective: Pt doing well postpartum . State her bleeding Is minimal . She denies pain . She is pumping and that is going well. She states her mood is good, her husband is helping her with the baby at home and helps her with feeding at night.   Assessment and Plan:  Edinburgh Postnatal Depression Scale - 01/11/23 1151       Edinburgh Postnatal Depression Scale:  In the Past 7 Days   I have been able to laugh and see the funny side of things. 0    I have looked forward with enjoyment to things. 0    I have blamed myself unnecessarily when things went wrong. 0    I have been anxious or worried for no good reason. 1    I have felt scared or panicky for no good reason. 0    Things have been getting on top of me. 0    I have been so unhappy that I have had difficulty sleeping. 0    I have felt sad or miserable. 0    I have been so unhappy that I have been crying. 0    The thought of harming myself has occurred to me. 0    Edinburgh Postnatal Depression Scale Total 1              Follow Up Instructions:    I discussed the assessment and treatment plan with the patient. The patient was provided an opportunity to ask questions and all were answered. The patient agreed with the plan and demonstrated an understanding of the instructions.   The patient was advised to call back or seek an in-person evaluation if the symptoms worsen or if the condition fails to improve as  anticipated.  I provided 10 minutes of non-face-to-face time during this encounter.   Philip Aspen, CNM

## 2023-01-24 DIAGNOSIS — Z419 Encounter for procedure for purposes other than remedying health state, unspecified: Secondary | ICD-10-CM | POA: Diagnosis not present

## 2023-02-07 ENCOUNTER — Encounter: Payer: Self-pay | Admitting: Obstetrics & Gynecology

## 2023-02-07 ENCOUNTER — Ambulatory Visit (INDEPENDENT_AMBULATORY_CARE_PROVIDER_SITE_OTHER): Payer: Medicaid Other | Admitting: Obstetrics & Gynecology

## 2023-02-07 DIAGNOSIS — B3789 Other sites of candidiasis: Secondary | ICD-10-CM | POA: Diagnosis not present

## 2023-02-07 DIAGNOSIS — O9102 Infection of nipple associated with the puerperium: Secondary | ICD-10-CM | POA: Diagnosis not present

## 2023-02-07 DIAGNOSIS — Z30430 Encounter for insertion of intrauterine contraceptive device: Secondary | ICD-10-CM | POA: Diagnosis not present

## 2023-02-07 DIAGNOSIS — Z3202 Encounter for pregnancy test, result negative: Secondary | ICD-10-CM

## 2023-02-07 LAB — POCT URINE PREGNANCY: Preg Test, Ur: NEGATIVE

## 2023-02-07 MED ORDER — HYDROCORTISONE ACETATE 25 MG RE SUPP: 25.0000 mg | Freq: Two times a day (BID) | RECTAL | 1 refills | Status: DC

## 2023-02-07 MED ORDER — LEVONORGESTREL 20 MCG/DAY IU IUD
1.0000 | INTRAUTERINE_SYSTEM | Freq: Once | INTRAUTERINE | Status: AC
  Administered 2023-02-07: 1 via INTRAUTERINE

## 2023-02-07 MED ORDER — FLUCONAZOLE 150 MG PO TABS: 150.0000 mg | ORAL_TABLET | Freq: Once | ORAL | 0 refills | Status: AC

## 2023-02-07 NOTE — Addendum Note (Signed)
Addended by: Cornelius Moras D on: 02/07/2023 02:12 PM   Modules accepted: Orders

## 2023-02-07 NOTE — Progress Notes (Signed)
Wol

## 2023-02-07 NOTE — Progress Notes (Signed)
Subjective:     Madison Dixon is a 27 y.o.P2 who presents for a postpartum visit. She is 6 weeks postpartum following a spontaneous vaginal delivery. I have fully reviewed the prenatal and intrapartum course. The delivery was at 38 1/2 gestational weeks. Outcome: spontaneous vaginal delivery.  Postpartum course has been normal. Baby's course has been normal. Baby is feeding by  pumped breast milk and formula . Bleeding no bleeding. Bowel function is normal. Bladder function is normal. Patient is sexually active. Contraception method is  withdrawal . She has been having sex since about 4 weeks postpartum.  Postpartum depression screening: negative.  She is using Preparation H for hemorrhoids and is requesting a prescription for this issue. She also thinks that she and the baby have thrush, would like a medication for this.  The following portions of the patient's history were reviewed and updated as appropriate: allergies, current medications, past family history, past medical history, past social history, past surgical history, and problem list.  Review of Systems Pertinent items are noted in HPI.   Objective:    BP (!) 108/59   Pulse 84   Ht 5\' 2"  (1.575 m)   Wt 166 lb (75.3 kg)   Breastfeeding Yes   BMI 30.36 kg/m   General:  alert   Breasts:  Nipples somewhat reddened  Lungs: clear to auscultation bilaterally  Heart:  regular rate and rhythm, S1, S2 normal, no murmur, click, rub or gallop  Abdomen: soft, non-tender; bowel sounds normal; no masses,  no organomegaly   Vulva:  normal, moderate atrophy noted  Vagina: normal vagina  Cervix:  anteverted  Corpus: normal size, contour, position, consistency, mobility, non-tender  Adnexa:  normal adnexa  Rectal Exam: No large hemorrhoids note          UPT negative, consent signed, Time out procedure done. Bimanual exam revealed a uterus NSSA, no adnexal masses or tenderness Cervix prepped with betadine and Hurricaine spray and then  grasped with a single tooth tenaculum. Mirena was easily placed and the strings were cut to 3-4 cm. Uterus sounded to 9cm. She tolerated the procedure well.  Assessment:    Normal  postpartum exam. Pap smear not done at today's visit.  Hemorrhoids  Plan:    1. Contraception:  Mirena 2. I rec'd backup method for 2 weeks 3. Follow up in:1 month for string check or as needed.  4. Anusol prescribed 5. Yeast infection of nipples- diflucan prescribed

## 2023-02-08 ENCOUNTER — Encounter: Payer: Self-pay | Admitting: Licensed Practical Nurse

## 2023-02-08 ENCOUNTER — Encounter: Payer: Self-pay | Admitting: Obstetrics & Gynecology

## 2023-02-09 ENCOUNTER — Other Ambulatory Visit: Payer: Self-pay | Admitting: Advanced Practice Midwife

## 2023-02-09 DIAGNOSIS — K649 Unspecified hemorrhoids: Secondary | ICD-10-CM

## 2023-02-09 MED ORDER — HYDROCORTISONE (PERIANAL) 2.5 % EX CREA: 1.0000 | TOPICAL_CREAM | Freq: Two times a day (BID) | CUTANEOUS | 1 refills | Status: DC

## 2023-02-09 NOTE — Progress Notes (Signed)
Anusol cream ordered- suppositories not covered by insurance.

## 2023-02-23 DIAGNOSIS — Z419 Encounter for procedure for purposes other than remedying health state, unspecified: Secondary | ICD-10-CM | POA: Diagnosis not present

## 2023-03-26 DIAGNOSIS — Z419 Encounter for procedure for purposes other than remedying health state, unspecified: Secondary | ICD-10-CM | POA: Diagnosis not present

## 2023-04-01 ENCOUNTER — Ambulatory Visit: Payer: Medicaid Other | Admitting: Certified Nurse Midwife

## 2023-04-25 DIAGNOSIS — Z419 Encounter for procedure for purposes other than remedying health state, unspecified: Secondary | ICD-10-CM | POA: Diagnosis not present

## 2023-05-05 ENCOUNTER — Telehealth: Payer: Self-pay

## 2023-05-05 NOTE — Telephone Encounter (Signed)
The provider is covering call at hospital X2 tried reach the patient for reschedule. Left detailed message advising the patient to contact the office for rescheduling.

## 2023-05-06 ENCOUNTER — Ambulatory Visit: Payer: Medicaid Other

## 2023-05-06 ENCOUNTER — Encounter: Payer: Self-pay | Admitting: Family Medicine

## 2023-05-06 ENCOUNTER — Ambulatory Visit (INDEPENDENT_AMBULATORY_CARE_PROVIDER_SITE_OTHER): Payer: Medicaid Other | Admitting: Family Medicine

## 2023-05-06 ENCOUNTER — Ambulatory Visit: Payer: Self-pay

## 2023-05-06 VITALS — BP 105/67 | HR 80 | Ht 62.0 in | Wt 170.0 lb

## 2023-05-06 DIAGNOSIS — R1011 Right upper quadrant pain: Secondary | ICD-10-CM | POA: Insufficient documentation

## 2023-05-06 LAB — POCT URINALYSIS DIPSTICK
Bilirubin, UA: NEGATIVE
Blood, UA: NEGATIVE
Clarity, UA: NEGATIVE
Color, UA: NEGATIVE
Glucose, UA: NEGATIVE
Ketones, UA: NEGATIVE
Leukocytes, UA: NEGATIVE
Nitrite, UA: NEGATIVE
Protein, UA: NEGATIVE
Spec Grav, UA: 1.01 (ref 1.010–1.025)
Urobilinogen, UA: NEGATIVE E.U./dL — AB
pH, UA: 7 (ref 5.0–8.0)

## 2023-05-06 NOTE — Telephone Encounter (Signed)
      Chief Complaint: Right upper abdominal pain that comes and goes. Notices this more after drinking alcohol Symptoms: Pain 5/10 Frequency: 2 years Pertinent Negatives: Patient denies vomiting or diarrhea Disposition: [] ED /[] Urgent Care (no appt availability in office) / [x] Appointment(In office/virtual)/ []  [facility]/ [] Home Care/ [] Refused Recommended Disposition /[] [facility]/ []  Follow-up with PCP Additional Notes: Pt. Agrees with appointment.  Reason for Disposition  [1] MODERATE pain (e.g., interferes with normal activities) AND [2] pain comes and goes (cramps) AND [3] present > 24 hours  (Exception: Pain with Vomiting or Diarrhea - see that Guideline.)  Answer Assessment - Initial Assessment Questions 1. LOCATION: "Where does it hurt?"      Right upper 2. RADIATION: "Does the pain shoot anywhere else?" (e.g., chest, back)     No 3. ONSET: "When did the pain begin?" (e.g., minutes, hours or days ago)      2 years ago 4. SUDDEN: "Gradual or sudden onset?"     Gradual 5. PATTERN "Does the pain come and go, or is it constant?"    - If it comes and goes: "How long does it last?" "Do you have pain now?"     (Note: Comes and goes means the pain is intermittent. It goes away completely between bouts.)    - If constant: "Is it getting better, staying the same, or getting worse?"      (Note: Constant means the pain never goes away completely; most serious pain is constant and gets worse.)      Comes and goes x 2 years 6. SEVERITY: "How bad is the pain?"  (e.g., Scale 1-10; mild, moderate, or severe)    - MILD (1-3): Doesn't interfere with normal activities, abdomen soft and not tender to touch.     - MODERATE (4-7): Interferes with normal activities or awakens from sleep, abdomen tender to touch.     - SEVERE (8-10): Excruciating pain, doubled over, unable to do any normal activities.       5 7. RECURRENT SYMPTOM: "Have you ever had this type of stomach  pain before?" If Yes, ask: "When was the last time?" and "What happened that time?"      Yes 8. CAUSE: "What do you think is causing the stomach pain?"     Unsure 9. RELIEVING/AGGRAVATING FACTORS: "What makes it better or worse?" (e.g., antacids, bending or twisting motion, bowel movement)     Drinking alcohol makes it worse 10. OTHER SYMPTOMS: "Do you have any other symptoms?" (e.g., back pain, diarrhea, fever, urination pain, vomiting)       No 11. PREGNANCY: "Is there any chance you are pregnant?" "When was your last menstrual period?"       No  Protocols used: Abdominal Pain - [facility]

## 2023-05-06 NOTE — Progress Notes (Signed)
Established patient visit  Patient: Madison Dixon   DOB: 08-24-1996   27 y.o. Female  MRN: [id number] Visit Date: 05/06/2023  Today's healthcare provider: Jacky Kindle, FNP  Introduced to nurse practitioner role and practice setting.  All questions answered.  Discussed provider/patient relationship and expectations.  Chief Complaint  Patient presents with   Abdominal Pain    Pt stated--right side abdominal--having pain especially after drinking alcohol.--2 years   Subjective    Abdominal Pain   HPI     Abdominal Pain    Additional comments: Pt stated--right side abdominal--having pain especially after drinking alcohol.--2 years      Last edited by Shelly Bombard, CMA on 05/06/2023  1:10 PM.      Medications: Outpatient Medications Prior to Visit  Medication Sig   levonorgestrel (MIRENA) 20 MCG/DAY IUD 1 each by Intrauterine route once.   [DISCONTINUED] hydrocortisone (ANUSOL-HC) 2.5 % rectal cream Place 1 Application rectally 2 (two) times daily.   [DISCONTINUED] Prenatal MV-Min-Fe Fum-FA-DHA (PRENATAL 1 PO) Take by mouth. (Patient not taking: Reported on 05/06/2023)   No facility-administered medications prior to visit.   Review of Systems  Gastrointestinal:  Positive for abdominal pain.     Objective    BP 105/67 (BP Location: Right Arm, Patient Position: Sitting, Cuff Size: Normal)   Pulse 80   Ht 5\' 2"  (1.575 m)   Wt 170 lb (77.1 kg)   SpO2 98%   Breastfeeding No   BMI 31.09 kg/m   Physical Exam Vitals and nursing note reviewed.  Constitutional:      General: She is not in acute distress.    Appearance: Normal appearance. She is well-developed. She is obese. She is not ill-appearing, toxic-appearing or diaphoretic.  HENT:     Head: Normocephalic and atraumatic.  Cardiovascular:     Rate and Rhythm: Normal rate and regular rhythm.     Pulses: Normal pulses.     Heart sounds: Normal heart sounds. No murmur heard.    No friction rub. No gallop.   Pulmonary:     Effort: Pulmonary effort is normal. No respiratory distress.     Breath sounds: Normal breath sounds. No stridor. No wheezing, rhonchi or rales.  Chest:     Chest wall: No tenderness.  Abdominal:     General: Bowel sounds are normal. There is no distension.     Palpations: Abdomen is soft. There is no mass.     Tenderness: There is no abdominal tenderness. There is no right CVA tenderness, left CVA tenderness or guarding.     Hernia: No hernia is present.  Musculoskeletal:        General: No swelling, tenderness, deformity or signs of injury. Normal range of motion.     Right lower leg: No edema.     Left lower leg: No edema.  Skin:    General: Skin is warm and dry.     Capillary Refill: Capillary refill takes less than 2 seconds.     Coloration: Skin is not jaundiced or pale.     Findings: No bruising, erythema, lesion or rash.  Neurological:     General: No focal deficit present.     Mental Status: She is alert and oriented to person, place, and time. Mental status is at baseline.     Cranial Nerves: No cranial nerve deficit.     Sensory: No sensory deficit.     Motor: No weakness.     Coordination: Coordination normal.  Psychiatric:  Mood and Affect: Mood normal.        Behavior: Behavior normal.        Thought Content: Thought content normal.        Judgment: Judgment normal.     Results for orders placed or performed in visit on 05/06/23  POCT urinalysis dipstick  Result Value Ref Range   Color, UA neg    Clarity, UA neg    Glucose, UA Negative Negative   Bilirubin, UA neg    Ketones, UA neg    Spec Grav, UA 1.010 1.010 - 1.025   Blood, UA neg    pH, UA 7.0 5.0 - 8.0   Protein, UA Negative Negative   Urobilinogen, UA negative (A) 0.2 or 1.0 E.U./dL   Nitrite, UA neg    Leukocytes, UA Negative Negative   Appearance     Odor      Assessment & Plan     Problem List Items Addressed This Visit       Other   RUQ pain - Primary     Colicky, RUQ pain; dull sensation; used to be intermittent following EtOH use; now reports near constant/chronic; worsening in past few months since having her last child Denies concern for family hx of liver or gallbladder dx Normal abdominal exam at this time UA negative Will complete CBC, CMP, Enzymes to assist Recommend RUQ [place] as next step       Relevant Orders   CBC with Differential/Platelet   Comprehensive Metabolic Panel (CMET)   Lipase   Amylase   POCT urinalysis dipstick (Completed)   US Abdomen Limited RUQ (LIVER/GB)   Return if symptoms worsen or fail to improve.     Leilani Merl, FNP, have reviewed all documentation for this visit. The documentation on 05/06/23 for the exam, diagnosis, procedures, and orders are all accurate and complete.  Jacky Kindle, FNP  [facility] [phone] (phone) [phone] (fax)  [facility]

## 2023-05-06 NOTE — Assessment & Plan Note (Signed)
Colicky, RUQ pain; dull sensation; used to be intermittent following EtOH use; now reports near constant/chronic; worsening in past few months since having her last child Denies concern for family hx of liver or gallbladder dx Normal abdominal exam at this time UA negative Will complete CBC, CMP, Enzymes to assist Recommend RUQ [place] as next step

## 2023-05-07 LAB — COMPREHENSIVE METABOLIC PANEL
ALT: 19 IU/L (ref 0–32)
AST: 14 IU/L (ref 0–40)
Albumin: 4.3 g/dL (ref 4.0–5.0)
Alkaline Phosphatase: 34 IU/L — ABNORMAL LOW (ref 44–121)
BUN/Creatinine Ratio: 14 (ref 9–23)
BUN: 11 mg/dL (ref 6–20)
Bilirubin Total: 0.3 mg/dL (ref 0.0–1.2)
CO2: 26 mmol/L (ref 20–29)
Calcium: 9.1 mg/dL (ref 8.7–10.2)
Chloride: 105 mmol/L (ref 96–106)
Creatinine, Ser: 0.76 mg/dL (ref 0.57–1.00)
Globulin, Total: 2 g/dL (ref 1.5–4.5)
Glucose: 86 mg/dL (ref 70–99)
Potassium: 3.9 mmol/L (ref 3.5–5.2)
Sodium: 142 mmol/L (ref 134–144)
Total Protein: 6.3 g/dL (ref 6.0–8.5)
eGFR: 111 mL/min/{1.73_m2} (ref >59)

## 2023-05-07 LAB — CBC WITH DIFFERENTIAL/PLATELET
Basophils Absolute: 0 10*3/uL (ref 0.0–0.2)
Basos: 1 %
EOS (ABSOLUTE): 0.1 10*3/uL (ref 0.0–0.4)
Eos: 2 %
Hematocrit: 37.5 % (ref 34.0–46.6)
Hemoglobin: 12.6 g/dL (ref 11.1–15.9)
Immature Grans (Abs): 0 10*3/uL (ref 0.0–0.1)
Immature Granulocytes: 0 %
Lymphocytes Absolute: 1.6 10*3/uL (ref 0.7–3.1)
Lymphs: 32 %
MCH: 30.4 pg (ref 26.6–33.0)
MCHC: 33.6 g/dL (ref 31.5–35.7)
MCV: 91 fL (ref 79–97)
Monocytes Absolute: 0.4 10*3/uL (ref 0.1–0.9)
Monocytes: 7 %
Neutrophils Absolute: 2.8 10*3/uL (ref 1.4–7.0)
Neutrophils: 58 %
Platelets: 316 10*3/uL (ref 150–450)
RBC: 4.14 x10E6/uL (ref 3.77–5.28)
RDW: 12.1 % (ref 11.7–15.4)
WBC: 4.9 10*3/uL (ref 3.4–10.8)

## 2023-05-07 LAB — AMYLASE: Amylase: 42 U/L (ref 31–110)

## 2023-05-07 LAB — LIPASE: Lipase: 19 U/L (ref 14–72)

## 2023-05-08 NOTE — Progress Notes (Signed)
Labs are normal and stable; proceed with ultrasound.

## 2023-05-11 ENCOUNTER — Ambulatory Visit
Admission: RE | Admit: 2023-05-11 | Discharge: 2023-05-11 | Disposition: A | Discharge status: Home / Self Care | Payer: Medicaid Other | Source: Ambulatory Visit | Attending: Family Medicine | Admitting: Family Medicine

## 2023-05-11 ENCOUNTER — Telehealth: Payer: Self-pay

## 2023-05-11 DIAGNOSIS — G8929 Other chronic pain: Secondary | ICD-10-CM | POA: Diagnosis not present

## 2023-05-11 DIAGNOSIS — R1011 Right upper quadrant pain: Secondary | ICD-10-CM | POA: Insufficient documentation

## 2023-05-11 NOTE — Telephone Encounter (Signed)
Copied from CRM [id number]. Topic: General - Other >> May 11, 2023  3:50 PM Everette C wrote: Reason for CRM: The patient would like to be contacted by a member of clinical staff when possible to further discuss their ultrasound results and additional imaging options  Please contact further when possible

## 2023-05-11 NOTE — Progress Notes (Signed)
Normal liver and gallbladder findings; reassuring. Continue to track triggers/flare for further complaints

## 2023-05-17 ENCOUNTER — Ambulatory Visit: Payer: Medicaid Other

## 2023-05-26 DIAGNOSIS — Z419 Encounter for procedure for purposes other than remedying health state, unspecified: Secondary | ICD-10-CM | POA: Diagnosis not present

## 2023-05-27 ENCOUNTER — Ambulatory Visit: Payer: Medicaid Other

## 2023-06-26 DIAGNOSIS — Z419 Encounter for procedure for purposes other than remedying health state, unspecified: Secondary | ICD-10-CM | POA: Diagnosis not present

## 2023-06-29 ENCOUNTER — Other Ambulatory Visit: Payer: Self-pay

## 2023-06-29 ENCOUNTER — Emergency Department: Payer: Medicaid Other

## 2023-06-29 ENCOUNTER — Encounter: Payer: Self-pay | Admitting: Emergency Medicine

## 2023-06-29 ENCOUNTER — Emergency Department
Admission: EM | Admit: 2023-06-29 | Discharge: 2023-06-30 | Disposition: A | Discharge status: Home / Self Care | Payer: Medicaid Other | Attending: Emergency Medicine | Admitting: Emergency Medicine

## 2023-06-29 DIAGNOSIS — R1030 Lower abdominal pain, unspecified: Secondary | ICD-10-CM

## 2023-06-29 DIAGNOSIS — R110 Nausea: Secondary | ICD-10-CM | POA: Diagnosis not present

## 2023-06-29 DIAGNOSIS — R1031 Right lower quadrant pain: Secondary | ICD-10-CM | POA: Insufficient documentation

## 2023-06-29 LAB — COMPREHENSIVE METABOLIC PANEL
ALT: 13 U/L (ref 0–44)
AST: 14 U/L — ABNORMAL LOW (ref 15–41)
Albumin: 4.2 g/dL (ref 3.5–5.0)
Alkaline Phosphatase: 36 U/L — ABNORMAL LOW (ref 38–126)
Anion gap: 9 (ref 5–15)
BUN: 13 mg/dL (ref 6–20)
CO2: 25 mmol/L (ref 22–32)
Calcium: 9.3 mg/dL (ref 8.9–10.3)
Chloride: 103 mmol/L (ref 98–111)
Creatinine, Ser: 0.7 mg/dL (ref 0.44–1.00)
GFR, Estimated: >60 mL/min (ref >60)
Glucose, Bld: 106 mg/dL — ABNORMAL HIGH (ref 70–99)
Potassium: 3.5 mmol/L (ref 3.5–5.1)
Sodium: 137 mmol/L (ref 135–145)
Total Bilirubin: 0.6 mg/dL (ref 0.3–1.2)
Total Protein: 7.3 g/dL (ref 6.5–8.1)

## 2023-06-29 LAB — URINALYSIS, ROUTINE W REFLEX MICROSCOPIC
Bilirubin Urine: NEGATIVE
Glucose, UA: NEGATIVE mg/dL
Hgb urine dipstick: NEGATIVE
Ketones, ur: NEGATIVE mg/dL
Leukocytes,Ua: NEGATIVE
Nitrite: NEGATIVE
Protein, ur: NEGATIVE mg/dL
Specific Gravity, Urine: 1.01 (ref 1.005–1.030)
pH: 8 (ref 5.0–8.0)

## 2023-06-29 LAB — LIPASE, BLOOD: Lipase: 28 U/L (ref 11–51)

## 2023-06-29 LAB — CBC
HCT: 39.2 % (ref 36.0–46.0)
Hemoglobin: 13.2 g/dL (ref 12.0–15.0)
MCH: 30.6 pg (ref 26.0–34.0)
MCHC: 33.7 g/dL (ref 30.0–36.0)
MCV: 91 fL (ref 80.0–100.0)
Platelets: 354 10*3/uL (ref 150–400)
RBC: 4.31 MIL/uL (ref 3.87–5.11)
RDW: 11.9 % (ref 11.5–15.5)
WBC: 10.2 10*3/uL (ref 4.0–10.5)
nRBC: 0 % (ref 0.0–0.2)

## 2023-06-29 LAB — POC URINE PREG, ED: Preg Test, Ur: NEGATIVE

## 2023-06-29 MED ORDER — KETOROLAC TROMETHAMINE 30 MG/ML IJ SOLN
15.0000 mg | Freq: Once | INTRAMUSCULAR | Status: AC
  Administered 2023-06-29: 15 mg via INTRAVENOUS
  Filled 2023-06-29: qty 1

## 2023-06-29 MED ORDER — ONDANSETRON HCL 4 MG/2ML IJ SOLN
4.0000 mg | INTRAMUSCULAR | Status: AC
  Administered 2023-06-29: 4 mg via INTRAVENOUS
  Filled 2023-06-29: qty 2

## 2023-06-29 MED ORDER — IOHEXOL 300 MG/ML  SOLN
100.0000 mL | Freq: Once | INTRAMUSCULAR | Status: AC | PRN
  Administered 2023-06-29: 100 mL via INTRAVENOUS

## 2023-06-29 NOTE — ED Triage Notes (Signed)
Pt presents ambulatory to triage via POV with complaints of R sided abdominal pain that started this AM. Pt states that within the last hour she has had the pain radiate down her R leg. A&Ox4 at this time. Denies N/V/D, CP or SOB.

## 2023-06-29 NOTE — ED Provider Notes (Signed)
[facility] Provider Note    Event Date/Time   First MD Initiated Contact with Patient 06/29/23 2322     (approximate)   History   Abdominal Pain   HPI Madison Dixon is a 27 y.o. female who presents for evaluation of pain in her lower abdomen and right lower quadrant.  She said that it started in the middle of her lower abdomen and then radiated to the right lower quadrant and even down to the top of her right leg.  It was associate with some nausea but no vomiting.  The pain was sharp earlier but now it is gotten better and is just mild dull pain.  She has had no diarrhea or constipation recently.  She denies fever, chest pain, shortness of breath.  She has an IUD and thus has irregular periods so she is not sure what her regular cycle is.  She says she has no concern for STD or PID.     Physical Exam   Triage Vital Signs: ED Triage Vitals  Encounter Vitals Group     BP 06/29/23 2230 (!) 114/58     Systolic BP Percentile --      Diastolic BP Percentile --      Pulse Rate 06/29/23 2230 77     Resp 06/29/23 2230 18     Temp 06/29/23 2230 98.5 F (36.9 C)     Temp src --      SpO2 06/29/23 2230 99 %     Weight 06/29/23 2231 77.1 kg (169 lb 15.6 oz)     Height 06/29/23 2231 1.575 m (5\' 2" )     Head Circumference --      Peak Flow --      Pain Score 06/29/23 2231 7     Pain Loc --      Pain Education --      Exclude from Growth Chart --     Most recent vital signs: Vitals:   06/29/23 2230  BP: (!) 114/58  Pulse: 77  Resp: 18  Temp: 98.5 F (36.9 C)  SpO2: 99%    General: Awake, no distress.  CV:  Good peripheral perfusion.  Resp:  Normal effort. Speaking easily and comfortably, no accessory muscle usage nor intercostal retractions.   Abd:  No distention.  Soft.  Mild tenderness to palpation of the right lower quadrant but without rebound or guarding, no peritonitis.   ED Results / Procedures / Treatments   Labs (all labs ordered  are listed, but only abnormal results are displayed) Labs Reviewed  COMPREHENSIVE METABOLIC PANEL - Abnormal; Notable for the following components:      Result Value   Glucose, Bld 106 (*)    AST 14 (*)    Alkaline Phosphatase 36 (*)    All other components within normal limits  URINALYSIS, ROUTINE W REFLEX MICROSCOPIC - Abnormal; Notable for the following components:   Color, Urine STRAW (*)    APPearance CLEAR (*)    All other components within normal limits  LIPASE, BLOOD  CBC  POC URINE PREG, ED     RADIOLOGY I viewed and interpreted the patient's CT of the abdomen and pelvis.  See hospital course for details.   PROCEDURES:  Critical Care performed: No  Procedures    IMPRESSION / MDM / ASSESSMENT AND PLAN / ED COURSE  I reviewed the triage vital signs and the nursing notes.  Differential diagnosis includes, but is not limited to, appendicitis, ovarian cysts, mesenteric adenitis, epiploic appendagitis, constipation, hernia, nonspecific musculoskeletal pain.  Patient's presentation is most consistent with acute presentation with potential threat to life or bodily function.  Labs/studies ordered: CT of the abdomen and pelvis, urinalysis, lipase, CMP, CBC, urine pregnancy test  Interventions/Medications given:  Medications  ketorolac (TORADOL) 30 MG/ML injection 15 mg (15 mg Intravenous Given 06/29/23 2341)  ondansetron (ZOFRAN) injection 4 mg (4 mg Intravenous Given 06/29/23 2341)  iohexol (OMNIPAQUE) 300 MG/ML solution 100 mL (100 mLs Intravenous Contrast Given 06/29/23 2354)    (Note:  hospital course my include additional interventions and/or labs/studies not listed above.)   Toradol 15 mg IV and Zofran 4 mg IV for pain and nausea which has improved greatly from before.  Vital signs are stable and within normal limits.  Physical exam is generally reassuring and labs are all within normal limits.  Patient has no concern for STDs and has no  vaginal complaints.  Will defer pelvic exam which is the patient's preference.  CT scan to rule out appendicitis.  Anticipate discharge and outpatient follow-up if scan is reassuring.     Clinical Course as of 06/30/23 0117  Thu Jun 30, 2023  0113 CT ABDOMEN PELVIS W CONTRAST I viewed and interpreted the patient's CT scan of the abdomen and pelvis.  There is no evidence of appendicitis, large ovarian cyst, or other acute abnormality or explanation for the patient's symptoms.  Patient is comfortable now and in no distress.  She is reassured by the results and comfortable with the plan for discharge and outpatient follow-up.  I gave my usual and customary follow-up recommendations and return precautions. [CF]    Clinical Course User Index [CF] Loleta Rose, MD     FINAL CLINICAL IMPRESSION(S) / ED DIAGNOSES   Final diagnoses:  Lower abdominal pain     Rx / DC Orders   ED Discharge Orders     None        Note:  This document was prepared using Dragon voice recognition software and may include unintentional dictation errors.   Loleta Rose, MD 06/30/23 [phone]

## 2023-06-30 DIAGNOSIS — R1031 Right lower quadrant pain: Secondary | ICD-10-CM | POA: Diagnosis not present

## 2023-06-30 NOTE — Discharge Instructions (Signed)

## 2023-07-04 NOTE — Group Note (Deleted)

## 2023-07-26 DIAGNOSIS — Z419 Encounter for procedure for purposes other than remedying health state, unspecified: Secondary | ICD-10-CM | POA: Diagnosis not present

## 2023-08-26 DIAGNOSIS — Z419 Encounter for procedure for purposes other than remedying health state, unspecified: Secondary | ICD-10-CM | POA: Diagnosis not present

## 2023-09-09 ENCOUNTER — Encounter: Payer: Self-pay | Admitting: Family Medicine

## 2023-09-13 ENCOUNTER — Encounter: Payer: Self-pay | Admitting: Physician Assistant

## 2023-09-13 ENCOUNTER — Ambulatory Visit: Payer: Medicaid Other | Admitting: Physician Assistant

## 2023-09-13 VITALS — BP 120/75 | HR 80 | Resp 16 | Ht 62.0 in | Wt 160.1 lb

## 2023-09-13 DIAGNOSIS — R109 Unspecified abdominal pain: Secondary | ICD-10-CM

## 2023-09-13 DIAGNOSIS — R5383 Other fatigue: Secondary | ICD-10-CM | POA: Diagnosis not present

## 2023-09-13 DIAGNOSIS — N926 Irregular menstruation, unspecified: Secondary | ICD-10-CM

## 2023-09-13 DIAGNOSIS — G8929 Other chronic pain: Secondary | ICD-10-CM | POA: Diagnosis not present

## 2023-09-13 DIAGNOSIS — R1011 Right upper quadrant pain: Secondary | ICD-10-CM | POA: Diagnosis not present

## 2023-09-13 NOTE — Progress Notes (Unsigned)
Established patient visit  Patient: Madison Dixon   DOB: 01-28-1996   27 y.o. Female  MRN: [id number] Visit Date: 09/13/2023  Today's healthcare provider: Debera Lat, PA-C   Chief Complaint  Patient presents with   Flank Pain    Right side, after drinking. Patient has had CT scan, US done and blood work.   Subjective    HPI HPI     Flank Pain    Additional comments: Right side, after drinking. Patient has had CT scan, US done and blood work.      Last edited by Marjie Skiff, CMA on 09/13/2023  4:12 PM.      *** Discussed the use of AI scribe software for clinical note transcription with the patient, who gave verbal consent to proceed.  History of Present Illness   The patient, a 27 year old with a history of endometriosis, presents with a recurring ache in her side. This pain has been ongoing since before her recent pregnancy and is often triggered by alcohol consumption, particularly margaritas, and sometimes by greasy food. The pain is described as an ache, located on the side, and often occurs the day after alcohol consumption. The patient reports that the pain has worsened since her last visit. Despite previous investigations, including an ultrasound and a CT scan, no abnormalities have been found. The patient also reports fatigue and irregular sleep patterns due to her work schedule and caring for her young children.           05/06/2023    1:16 PM 03/19/2022    4:11 PM 08/08/2018    8:26 AM  Depression screen PHQ 2/9  Decreased Interest 0 0 0  Down, Depressed, Hopeless 0 0 0  PHQ - 2 Score 0 0 0  Altered sleeping 0 0   Tired, decreased energy 0 0   Change in appetite 0 0   Feeling bad or failure about yourself  0 0   Trouble concentrating 0 0   Moving slowly or fidgety/restless 0 0   Suicidal thoughts 0 0   PHQ-9 Score 0 0   Difficult doing work/chores Not difficult at all Not difficult at all        No data to display           Medications: Outpatient Medications Prior to Visit  Medication Sig   levonorgestrel (MIRENA) 20 MCG/DAY IUD 1 each by Intrauterine route once.   No facility-administered medications prior to visit.    Review of Systems Except see HPI   {Insert previous labs (optional):23779} {See past labs  Heme  Chem  Endocrine  Serology  Results Review (optional):1}   Objective    BP 120/75 (BP Location: Right Arm, Patient Position: Sitting, Cuff Size: Normal)   Pulse 80   Resp 16   Ht 5\' 2"  (1.575 m)   Wt 160 lb 1.6 oz (72.6 kg)   LMP 09/09/2023   BMI 29.28 kg/m  {Insert last BP/Wt (optional):23777}{See vitals history (optional):1}   Physical Exam   No results found for any visits on 09/13/23.  Assessment & Plan    *** Assessment and Plan    Abdominal Pain Chronic right upper quadrant pain, worse after alcohol and greasy food intake. Previous imaging and blood work unremarkable. Possible chronic cholecystitis. -Stop alcohol and greasy food intake. -Order blood work to rule out infection and assess liver function. -Consider referral to gastroenterologist if symptoms persist despite dietary changes.  Fatigue Chronic fatigue, possibly related to sleep deprivation and  postpartum period. -Encourage adequate rest and sleep. -Consider further evaluation if fatigue persists despite adequate rest.  Menstrual Changes Heavy menstrual bleeding after a long period of amenorrhea, possibly related to postpartum hormonal changes. -Monitor menstrual cycle for normalization. -Consider further evaluation if heavy bleeding persists.         No follow-ups on file.      [facility]

## 2023-09-15 DIAGNOSIS — R5383 Other fatigue: Secondary | ICD-10-CM | POA: Diagnosis not present

## 2023-09-15 DIAGNOSIS — R1011 Right upper quadrant pain: Secondary | ICD-10-CM | POA: Diagnosis not present

## 2023-09-15 DIAGNOSIS — G8929 Other chronic pain: Secondary | ICD-10-CM | POA: Diagnosis not present

## 2023-09-16 LAB — PT AND PTT
INR: 0.9 (ref 0.9–1.2)
Prothrombin Time: 10.7 s (ref 9.1–12.0)
aPTT: 27 s (ref 24–33)

## 2023-09-16 LAB — HEPATIC FUNCTION PANEL
ALT: 10 [IU]/L (ref 0–32)
AST: 13 [IU]/L (ref 0–40)
Albumin: 4.7 g/dL (ref 4.0–5.0)
Alkaline Phosphatase: 44 [IU]/L (ref 44–121)
Bilirubin Total: 0.6 mg/dL (ref 0.0–1.2)
Bilirubin, Direct: 0.17 mg/dL (ref 0.00–0.40)
Total Protein: 7 g/dL (ref 6.0–8.5)

## 2023-09-16 LAB — CBC WITH DIFFERENTIAL/PLATELET
Basophils Absolute: 0 10*3/uL (ref 0.0–0.2)
Basos: 1 %
EOS (ABSOLUTE): 0.1 10*3/uL (ref 0.0–0.4)
Eos: 2 %
Hematocrit: 41.5 % (ref 34.0–46.6)
Hemoglobin: 13.6 g/dL (ref 11.1–15.9)
Immature Grans (Abs): 0 10*3/uL (ref 0.0–0.1)
Immature Granulocytes: 0 %
Lymphocytes Absolute: 2.2 10*3/uL (ref 0.7–3.1)
Lymphs: 29 %
MCH: 30.4 pg (ref 26.6–33.0)
MCHC: 32.8 g/dL (ref 31.5–35.7)
MCV: 93 fL (ref 79–97)
Monocytes Absolute: 0.4 10*3/uL (ref 0.1–0.9)
Monocytes: 5 %
Neutrophils Absolute: 4.8 10*3/uL (ref 1.4–7.0)
Neutrophils: 63 %
Platelets: 328 10*3/uL (ref 150–450)
RBC: 4.48 x10E6/uL (ref 3.77–5.28)
RDW: 11.9 % (ref 11.7–15.4)
WBC: 7.5 10*3/uL (ref 3.4–10.8)

## 2023-09-18 ENCOUNTER — Encounter: Payer: Self-pay | Admitting: Physician Assistant

## 2023-09-18 DIAGNOSIS — G8929 Other chronic pain: Secondary | ICD-10-CM

## 2023-09-25 DIAGNOSIS — Z419 Encounter for procedure for purposes other than remedying health state, unspecified: Secondary | ICD-10-CM | POA: Diagnosis not present

## 2023-10-26 DIAGNOSIS — Z419 Encounter for procedure for purposes other than remedying health state, unspecified: Secondary | ICD-10-CM | POA: Diagnosis not present

## 2023-11-06 DIAGNOSIS — Z419 Encounter for procedure for purposes other than remedying health state, unspecified: Secondary | ICD-10-CM | POA: Diagnosis not present

## 2023-11-09 ENCOUNTER — Ambulatory Visit: Payer: Medicaid Other | Admitting: Licensed Practical Nurse

## 2023-11-24 ENCOUNTER — Ambulatory Visit: Payer: Medicaid Other | Admitting: Physician Assistant

## 2023-11-24 ENCOUNTER — Encounter: Payer: Self-pay | Admitting: Physician Assistant

## 2023-11-24 VITALS — BP 119/69 | HR 81 | Temp 98.2°F | Ht 62.0 in | Wt 159.6 lb

## 2023-11-24 DIAGNOSIS — R194 Change in bowel habit: Secondary | ICD-10-CM | POA: Diagnosis not present

## 2023-11-24 DIAGNOSIS — R1011 Right upper quadrant pain: Secondary | ICD-10-CM | POA: Diagnosis not present

## 2023-11-24 DIAGNOSIS — Z860101 Personal history of adenomatous and serrated colon polyps: Secondary | ICD-10-CM

## 2023-11-24 DIAGNOSIS — K625 Hemorrhage of anus and rectum: Secondary | ICD-10-CM | POA: Diagnosis not present

## 2023-11-24 DIAGNOSIS — R198 Other specified symptoms and signs involving the digestive system and abdomen: Secondary | ICD-10-CM

## 2023-11-24 DIAGNOSIS — K649 Unspecified hemorrhoids: Secondary | ICD-10-CM

## 2023-11-24 MED ORDER — HYDROCORTISONE (PERIANAL) 2.5 % EX CREA
1.0000 | TOPICAL_CREAM | Freq: Two times a day (BID) | CUTANEOUS | 1 refills | Status: AC
Start: 2023-11-24 — End: ?

## 2023-11-24 MED ORDER — NA SULFATE-K SULFATE-MG SULF 17.5-3.13-1.6 GM/177ML PO SOLN
1.0000 | Freq: Once | ORAL | 0 refills | Status: AC
Start: 2023-11-24 — End: 2023-11-24

## 2023-11-24 MED ORDER — DICYCLOMINE HCL 10 MG PO CAPS
10.0000 mg | ORAL_CAPSULE | Freq: Three times a day (TID) | ORAL | 2 refills | Status: AC
Start: 2023-11-24 — End: 2024-02-22

## 2023-11-24 NOTE — Progress Notes (Signed)
Madison Amy, PA-C [address]  [address]  [place], [place] [address]  Main: [phone]  Fax: [phone]   Gastroenterology Consultation  Referring Provider:     Jacky Kindle, FNP Primary Care Physician:  Jacky Kindle, FNP (Inactive) Primary Gastroenterologist:  Madison Amy, PA-C / Dr. Wyline Mood   Reason for Consultation:     Chronic RUQ pain        HPI:   Madison Dixon is a 28 y.o. y/o female referred for consultation & management  by Jacky Kindle, FNP (Inactive).    Here to evaluate chronic right upper quadrant abdominal pain ongoing for several years.  Usually triggered by drinking alcohol or eating greasy foods.  It is a dull ache and occurs every day.  She denies nausea or vomiting.  She admits to bowel irregularities.  She has episodes of diarrhea alternating with constipation.  She has also had episodes of rectal bleeding with flareup of hemorrhoids.  Has seen mucus in her stools.  She denies weight loss.  Denies family history of colon cancer.  Has a history of endometriosis.  Underwent exploratory laparotomy with lysis of adhesions many years ago.  She has a 65-year-old daughter and 66-year-old son.  Not currently breast-feeding.  She has Mirena IUD.  06/30/2023: CT abdomen pelvis with contrast: No acute abnormality.  No appendicitis.  IUD present.  04/2023: RUQ ultrasound: Normal.  No gallstones.  09/15/2023 labs: Normal CBC, hepatic panel, PT/INR. 06/2023 labs: Negative pregnancy test.  Normal lipase, CBC and CMP.  Negative UA.  01/2016 EGD by Dr. Shelle Iron: Normal.  Biopsies negative for celiac and H. pylori.  01/2016 colonoscopy: 4 mm tubular adenoma polyp removed from the rectosigmoid colon.  Biopsies negative for microscopic colitis.  Past Medical History:  Diagnosis Date   Anxiety    Asthma    sports induced   Bipolar disorder (HCC)    Chronic pelvic pain in female 09/09/2016   Depression    Endometriosis determined by laparoscopy 09/09/2016    Ovarian cyst    Pilonidal cyst    PONV (postoperative nausea and vomiting)     Past Surgical History:  Procedure Laterality Date   COLONOSCOPY WITH ESOPHAGOGASTRODUODENOSCOPY (EGD)     DILATION AND EVACUATION N/A 01/13/2021   Procedure: Suction D&C;  Surgeon: Vena Austria, MD;  Location: [facility];  Service: Gynecology;  Laterality: N/A;   LAPAROSCOPY N/A 09/09/2016   Procedure: LAPAROSCOPY DIAGNOSTIC, FULGERATION OF ENDOMETRIOSIS;  Surgeon: Conard Novak, MD;  Location: [facility];  Service: Gynecology;  Laterality: N/A;   PILONIDAL CYST EXCISION N/A 04/10/2015   Procedure: CYST EXCISION PILONIDAL EXTENSIVE;  Surgeon: Kieth Brightly, MD;  Location: [facility];  Service: General;  Laterality: N/A;   WISDOM TOOTH EXTRACTION     lower two taken out;  age 81    Prior to Admission medications   Medication Sig Start Date End Date Taking? Authorizing Provider  levonorgestrel (MIRENA) 20 MCG/DAY IUD 1 each by Intrauterine route once.    [provider]    Family History  Problem Relation Age of Onset   Hyperlipidemia Father    GER disease Father    Cancer Maternal Grandmother        unknown   Cancer Paternal Grandmother        unknown     Social History   Tobacco Use   Smoking status: Never   Smokeless tobacco: Never  Vaping Use   Vaping status: Never Used  Substance Use Topics   Alcohol use: Not Currently    Comment: once every2 weeks   Drug use: Never    Allergies as of 11/24/2023 - Review Complete 11/24/2023  Allergen Reaction Noted   Aripiprazole Nausea And Vomiting 11/29/2016   Vicodin [hydrocodone-acetaminophen] Nausea And Vomiting 09/01/2016    Review of Systems:    All systems reviewed and negative except where noted in HPI.   Physical Exam:  BP 119/69   Pulse 81   Temp 98.2 F (36.8 C)   Ht 5\' 2"  (1.575 m)   Wt 159 lb 9.6 oz (72.4 kg)   BMI 29.19 kg/m  No LMP recorded. (Menstrual status: IUD).  General:   Alert,  Well-developed,  well-nourished, pleasant and cooperative in NAD Lungs:  Respirations even and unlabored.  Clear throughout to auscultation.   No wheezes, crackles, or rhonchi. No acute distress. Heart:  Regular rate and rhythm; no murmurs, clicks, rubs, or gallops. Abdomen:  Normal bowel sounds.  No bruits.  Soft, and non-distended without masses, hepatosplenomegaly or hernias noted.  Mild to moderate RUQ and Right mid lateral abdominal Tenderness.  Mild bilateral lower abdominal tenderness.  No guarding or rebound tenderness.  Neurologic:  Alert and oriented x3;  grossly normal neurologically. Psych:  Alert and cooperative. Normal mood and affect.  Imaging Studies: No results found.  Assessment and Plan:   [name] is a 28 y.o. y/o female has been referred for:  1.  RUQ pain: Differential includes biliary dyskinesia versus IBS with colon spasm.  Recent abdominal pelvic CT and RUQ ultrasound were unrevealing.  No gallstones.  HIDA scan  Rx dicyclomine 10 Mg 3 times daily as needed for abdominal pain.  2.  Rectal bleeding and Hx Tubular Adenoma Polyp (Colonoscopy 01/2016)  Scheduling Colonoscopy I discussed risks of colonoscopy with patient to include risk of bleeding, colon perforation, and risk of sedation.  Patient expressed understanding and agrees to proceed with colonoscopy.   3.  Irregular bowel habits  Start OTC align probiotic 1 capsule once daily  4.  Hemorrhoids  Rx hydrocortisone cream 2.5% apply twice daily as needed  Hemorrhoid treatment discussed  Follow up 4 weeks after colonoscopy.  Madison Amy, PA-C

## 2023-11-24 NOTE — Patient Instructions (Addendum)
  Hida scan scheduled @ [facility] on 2/5-25 arrive 7:30 am. No opiate base pain medications within 8 hours of this scan.  Take 1 Capsule Once Daily for 30 days. If GI symptoms improve, then OK to continue Align. If GI symptoms do not improve after 30 days, then discontinue.

## 2023-11-26 DIAGNOSIS — Z419 Encounter for procedure for purposes other than remedying health state, unspecified: Secondary | ICD-10-CM | POA: Diagnosis not present

## 2023-11-30 ENCOUNTER — Other Ambulatory Visit: Payer: Medicaid Other

## 2023-12-08 ENCOUNTER — Ambulatory Visit
Admission: RE | Admit: 2023-12-08 | Discharge: 2023-12-08 | Disposition: A | Discharge status: Home / Self Care | Payer: Medicaid Other | Source: Ambulatory Visit | Attending: Physician Assistant | Admitting: Physician Assistant

## 2023-12-08 DIAGNOSIS — R1011 Right upper quadrant pain: Secondary | ICD-10-CM | POA: Diagnosis not present

## 2023-12-08 MED ORDER — TECHNETIUM TC 99M MEBROFENIN IV KIT
5.1300 | PACK | Freq: Once | INTRAVENOUS | Status: AC | PRN
  Administered 2023-12-08: 5.13 via INTRAVENOUS

## 2023-12-09 ENCOUNTER — Encounter: Payer: Self-pay | Admitting: Physician Assistant

## 2023-12-12 ENCOUNTER — Telehealth: Payer: Self-pay

## 2023-12-12 NOTE — Telephone Encounter (Signed)
Patient's physician for scheduled colonoscopy has changed from Dr. Tobi Bastos on 02/19 to Dr. Allegra Lai due to scope availability for Dr. Tobi Bastos.  Procedure will now be with Dr. Allegra Lai.  Patient could not r/s with Dr. Tobi Bastos to a later date due to insurance will end at the end of this month.  Thanks, [place], [place]

## 2023-12-13 ENCOUNTER — Encounter: Payer: Self-pay | Admitting: Gastroenterology

## 2023-12-14 ENCOUNTER — Encounter: Payer: Self-pay | Admitting: Gastroenterology

## 2023-12-14 ENCOUNTER — Ambulatory Visit: Payer: Medicaid Other | Admitting: Certified Registered"

## 2023-12-14 ENCOUNTER — Ambulatory Visit
Admission: RE | Admit: 2023-12-14 | Discharge: 2023-12-14 | Disposition: A | Discharge status: Home / Self Care | Payer: Medicaid Other | Attending: Gastroenterology | Admitting: Gastroenterology

## 2023-12-14 ENCOUNTER — Encounter
Admission: RE | Disposition: A | Discharge status: Home / Self Care | Payer: Self-pay | Source: Home / Self Care | Attending: Gastroenterology

## 2023-12-14 DIAGNOSIS — K5730 Diverticulosis of large intestine without perforation or abscess without bleeding: Secondary | ICD-10-CM | POA: Diagnosis not present

## 2023-12-14 DIAGNOSIS — K625 Hemorrhage of anus and rectum: Secondary | ICD-10-CM

## 2023-12-14 DIAGNOSIS — F319 Bipolar disorder, unspecified: Secondary | ICD-10-CM | POA: Diagnosis not present

## 2023-12-14 DIAGNOSIS — Z1211 Encounter for screening for malignant neoplasm of colon: Secondary | ICD-10-CM | POA: Insufficient documentation

## 2023-12-14 DIAGNOSIS — Z860101 Personal history of adenomatous and serrated colon polyps: Secondary | ICD-10-CM | POA: Diagnosis not present

## 2023-12-14 HISTORY — PX: COLONOSCOPY WITH PROPOFOL: SHX5780

## 2023-12-14 HISTORY — DX: Post-traumatic stress disorder, unspecified: F43.10

## 2023-12-14 LAB — POCT PREGNANCY, URINE: Preg Test, Ur: NEGATIVE

## 2023-12-14 SURGERY — COLONOSCOPY WITH PROPOFOL
Anesthesia: General

## 2023-12-14 MED ORDER — PROPOFOL 10 MG/ML IV BOLUS
INTRAVENOUS | Status: DC | PRN
Start: 2023-12-14 — End: 2023-12-14
  Administered 2023-12-14: 40 mg via INTRAVENOUS
  Administered 2023-12-14: 60 mg via INTRAVENOUS
  Administered 2023-12-14: 40 mg via INTRAVENOUS

## 2023-12-14 MED ORDER — MIDAZOLAM HCL 2 MG/2ML IJ SOLN
INTRAMUSCULAR | Status: AC
  Filled 2023-12-14: qty 2

## 2023-12-14 MED ORDER — LIDOCAINE HCL (CARDIAC) PF 100 MG/5ML IV SOSY
PREFILLED_SYRINGE | INTRAVENOUS | Status: DC | PRN
  Administered 2023-12-14: 100 mg via INTRAVENOUS

## 2023-12-14 MED ORDER — MIDAZOLAM HCL 2 MG/2ML IJ SOLN
INTRAMUSCULAR | Status: DC | PRN
  Administered 2023-12-14: 2 mg via INTRAVENOUS

## 2023-12-14 MED ORDER — PROPOFOL 500 MG/50ML IV EMUL
INTRAVENOUS | Status: DC | PRN
  Administered 2023-12-14: 165 ug/kg/min via INTRAVENOUS

## 2023-12-14 MED ORDER — SODIUM CHLORIDE 0.9 % IV SOLN: INTRAVENOUS | Status: DC

## 2023-12-14 NOTE — Anesthesia Procedure Notes (Signed)
Procedure Name: General with mask airway Date/Time: 12/14/2023 9:54 AM  Performed by: Mohammed Kindle, CRNAPre-anesthesia Checklist: Patient identified, Emergency Drugs available, Suction available and Patient being monitored Patient Re-evaluated:Patient Re-evaluated prior to induction Oxygen Delivery Method: Simple face mask Induction Type: IV induction Placement Confirmation: positive ETCO2 and breath sounds checked- equal and bilateral Dental Injury: Teeth and Oropharynx as per pre-operative assessment

## 2023-12-14 NOTE — Anesthesia Preprocedure Evaluation (Signed)
Anesthesia Evaluation  Patient identified by MRN, date of birth, ID band Patient awake    Reviewed: Allergy & Precautions, NPO status , Patient's Chart, lab work & pertinent test results  History of Anesthesia Complications (+) PONV and history of anesthetic complications  Airway Mallampati: II  TM Distance: >3 FB Neck ROM: full    Dental  (+) Teeth Intact   Pulmonary neg pulmonary ROS, asthma    Pulmonary exam normal breath sounds clear to auscultation       Cardiovascular Exercise Tolerance: Good negative cardio ROS Normal cardiovascular exam Rhythm:Regular Rate:Normal     Neuro/Psych   Anxiety  Bipolar Disorder   negative neurological ROS  negative psych ROS   GI/Hepatic negative GI ROS, Neg liver ROS,,,  Endo/Other  negative endocrine ROS    Renal/GU negative Renal ROS  negative genitourinary   Musculoskeletal   Abdominal Normal abdominal exam  (+)   Peds negative pediatric ROS (+)  Hematology negative hematology ROS (+)   Anesthesia Other Findings Past Medical History: No date: Anxiety No date: Asthma     Comment:  sports induced No date: Bipolar disorder (HCC) 09/09/2016: Chronic pelvic pain in female No date: Depression 09/09/2016: Endometriosis determined by laparoscopy No date: Ovarian cyst No date: Pilonidal cyst No date: PONV (postoperative nausea and vomiting) No date: PTSD (post-traumatic stress disorder)  Past Surgical History: No date: COLONOSCOPY WITH ESOPHAGOGASTRODUODENOSCOPY (EGD) 01/13/2021: DILATION AND EVACUATION; N/A     Comment:  Procedure: Suction D&C;  Surgeon: Vena Austria, MD;              Location: [facility];  Service: Gynecology;  Laterality:               N/A; No date: ESOPHAGOGASTRODUODENOSCOPY 09/09/2016: LAPAROSCOPY; N/A     Comment:  Procedure: LAPAROSCOPY DIAGNOSTIC, FULGERATION OF               ENDOMETRIOSIS;  Surgeon: Conard Novak, MD;                 Location: [facility];  Service: Gynecology;  Laterality:               N/A; 04/10/2015: PILONIDAL CYST EXCISION; N/A     Comment:  Procedure: CYST EXCISION PILONIDAL EXTENSIVE;  Surgeon:               Kieth Brightly, MD;  Location: [facility];  Service:              General;  Laterality: N/A; No date: WISDOM TOOTH EXTRACTION     Comment:  lower two taken out;  age 28  BMI    Body Mass Index: 28.90 kg/m      Reproductive/Obstetrics negative OB ROS                             Anesthesia Physical Anesthesia Plan  ASA: 1  Anesthesia Plan: General   Post-op Pain Management:    Induction: Intravenous  PONV Risk Score and Plan: Propofol infusion and TIVA  Airway Management Planned: Natural Airway and Nasal Cannula  Additional Equipment:   Intra-op Plan:   Post-operative Plan:   Informed Consent: I have reviewed the patients History and Physical, chart, labs and discussed the procedure including the risks, benefits and alternatives for the proposed anesthesia with the patient or authorized representative who has indicated his/her understanding and acceptance.     Dental Advisory Given  Plan Discussed with:  CRNA  Anesthesia Plan Comments:        Anesthesia Quick Evaluation

## 2023-12-14 NOTE — H&P (Signed)
Arlyss Repress, MD [address]  [address]  [place], [place] [address]  Main: [phone]  Fax: [phone] Pager: [phone]  Primary Care Physician:  Jacky Kindle, FNP Primary Gastroenterologist:  Dr. Arlyss Repress  Pre-Procedure History & Physical: HPI:  Madison Dixon is a 28 y.o. female is here for an colonoscopy.   Past Medical History:  Diagnosis Date   Anxiety    Asthma    sports induced   Bipolar disorder (HCC)    Chronic pelvic pain in female 09/09/2016   Depression    Endometriosis determined by laparoscopy 09/09/2016   Ovarian cyst    Pilonidal cyst    PONV (postoperative nausea and vomiting)    PTSD (post-traumatic stress disorder)     Past Surgical History:  Procedure Laterality Date   COLONOSCOPY WITH ESOPHAGOGASTRODUODENOSCOPY (EGD)     DILATION AND EVACUATION N/A 01/13/2021   Procedure: Suction D&C;  Surgeon: Vena Austria, MD;  Location: [facility];  Service: Gynecology;  Laterality: N/A;   ESOPHAGOGASTRODUODENOSCOPY     LAPAROSCOPY N/A 09/09/2016   Procedure: LAPAROSCOPY DIAGNOSTIC, FULGERATION OF ENDOMETRIOSIS;  Surgeon: Conard Novak, MD;  Location: [facility];  Service: Gynecology;  Laterality: N/A;   PILONIDAL CYST EXCISION N/A 04/10/2015   Procedure: CYST EXCISION PILONIDAL EXTENSIVE;  Surgeon: Kieth Brightly, MD;  Location: [facility];  Service: General;  Laterality: N/A;   WISDOM TOOTH EXTRACTION     lower two taken out;  age 83    Prior to Admission medications   Medication Sig Start Date End Date Taking? Authorizing Provider  lithium carbonate (ESKALITH) 450 MG ER tablet Take by mouth at bedtime.   Yes [provider]  lurasidone (LATUDA) 20 MG TABS tablet Take by mouth daily with supper.   Yes [provider]  dicyclomine (BENTYL) 10 MG capsule Take 1 capsule (10 mg total) by mouth 3 (three) times daily before meals. 11/24/23 02/22/24  Celso Amy, PA-C  hydrocortisone (ANUSOL-HC) 2.5 % rectal cream  Place 1 Application rectally 2 (two) times daily. 11/24/23   Celso Amy, PA-C  levonorgestrel (MIRENA) 20 MCG/DAY IUD 1 each by Intrauterine route once.    [provider]    Allergies as of 11/24/2023 - Review Complete 11/24/2023  Allergen Reaction Noted   Aripiprazole Nausea And Vomiting 11/29/2016   Vicodin [hydrocodone-acetaminophen] Nausea And Vomiting 09/01/2016    Family History  Problem Relation Age of Onset   Hyperlipidemia Father    GER disease Father    Cancer Maternal Grandmother        unknown   Cancer Paternal Grandmother        unknown    Social History   Socioeconomic History   Marital status: Divorced    Spouse name: Brett Canales   Number of children: 1   Years of education: 12   Highest education level: Not on file  Occupational History   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  Tobacco Use   Smoking status: Never   Smokeless tobacco: Never  Vaping Use   Vaping status: Never Used  Substance and Sexual Activity   Alcohol use: Not Currently    Comment: once every2 weeks   Drug use: Never   Sexual activity: Yes    Partners: Male  Other Topics Concern   Not on file  Social History Narrative   ** Merged History Encounter **       Social Drivers of Health   Financial Resource Strain: Low Risk  (05/10/2022)   Overall  Financial Resource Strain (CARDIA)    Difficulty of Paying Living Expenses: Not hard at all  Food Insecurity: No Food Insecurity (12/19/2022)   Hunger Vital Sign    Worried About Running Out of Food in the Last Year: Never true    Ran Out of Food in the Last Year: Never true  Transportation Needs: No Transportation Needs (12/19/2022)   PRAPARE - Administrator, Civil Service (Medical): No    Lack of Transportation (Non-Medical): No  Physical Activity: Inactive (05/10/2022)   Exercise Vital Sign    Days of Exercise per Week: 0 days    Minutes of Exercise per Session: 0 min  Stress: No Stress Concern Present (05/10/2022)    [employer] of Occupational Health - Occupational Stress Questionnaire    Feeling of Stress : Not at all  Social Connections: Socially Isolated (05/10/2022)   Social Connection and Isolation Panel [NHANES]    Frequency of Communication with Friends and Family: More than three times a week    Frequency of Social Gatherings with Friends and Family: More than three times a week    Attends Religious Services: Never    Database administrator or Organizations: No    Attends Banker Meetings: Never    Marital Status: Divorced  Catering manager Violence: Not At Risk (12/20/2022)   Humiliation, Afraid, Rape, and Kick questionnaire    Fear of Current or Ex-Partner: No    Emotionally Abused: No    Physically Abused: No    Sexually Abused: No    Review of Systems: See HPI, otherwise negative ROS  Physical Exam: BP (!) 105/56   Pulse 65   Temp (!) 96.7 F (35.9 C) (Temporal)   Resp 16   Wt 71.7 kg   LMP 12/05/2023 (Approximate)   SpO2 99%   BMI 28.90 kg/m  General:   Alert,  pleasant and cooperative in NAD Head:  Normocephalic and atraumatic. Neck:  Supple; no masses or thyromegaly. Lungs:  Clear throughout to auscultation.    Heart:  Regular rate and rhythm. Abdomen:  Soft, nontender and nondistended. Normal bowel sounds, without guarding, and without rebound.   Neurologic:  Alert and  oriented x4;  grossly normal neurologically.  Impression/Plan: Madison Dixon is here for an colonoscopy to be performed for h/o adenoma colon  Risks, benefits, limitations, and alternatives regarding  colonoscopy have been reviewed with the patient.  Questions have been answered.  All parties agreeable.   Lannette Donath, MD  12/14/2023, 9:40 AM

## 2023-12-14 NOTE — Transfer of Care (Signed)
Immediate Anesthesia Transfer of Care Note  Patient: Madison Dixon  Procedure(s) Performed: COLONOSCOPY WITH PROPOFOL  Patient Location: Endoscopy Unit  Anesthesia Type:General  Level of Consciousness: awake, drowsy, and patient cooperative  Airway & Oxygen Therapy: Patient Spontanous Breathing and Patient connected to face mask oxygen  Post-op Assessment: Report given to RN and Post -op Vital signs reviewed and stable  Post vital signs: Reviewed and stable  Last Vitals:  Vitals Value Taken Time  BP 109/61 12/14/23 1011  Temp    Pulse 86 12/14/23 1011  Resp 24 12/14/23 1012  SpO2 100 % 12/14/23 1011  Vitals shown include unfiled device data.  Last Pain:  Vitals:   12/14/23 1011  TempSrc:   PainSc: 0-No pain         Complications: No notable events documented.

## 2023-12-14 NOTE — Op Note (Signed)
[facility] Patient Name: Madison Dixon Procedure Date: 12/14/2023 9:47 AM MRN: [id number] Account #: [id number] Date of Birth: 29-Jul-1996 Admit Type: Outpatient Age: 28 Room: [facility] ENDO ROOM 4 Gender: Female Note Status: Finalized Instrument Name: Prentice Docker [id number] Procedure:             Colonoscopy Indications:           Surveillance: Personal history of adenomatous polyps                         on last colonoscopy 5 years ago Providers:             Toney Reil MD, MD Referring MD:          Daryl Eastern. Suzie Portela (Referring MD) Medicines:             General Anesthesia Complications:         No immediate complications. Estimated blood loss: None. Procedure:             Pre-Anesthesia Assessment:                        - Prior to the procedure, a History and Physical was                         performed, and patient medications and allergies were                         reviewed. The patient is competent. The risks and                         benefits of the procedure and the sedation options and                         risks were discussed with the patient. All questions                         were answered and informed consent was obtained.                         Patient identification and proposed procedure were                         verified by the physician, the nurse, the                         anesthesiologist, the anesthetist and the technician                         in the pre-procedure area in the procedure room in the                         endoscopy suite. Mental Status Examination: alert and                         oriented. Airway Examination: normal oropharyngeal                         airway and neck mobility. Respiratory Examination:  clear to auscultation. CV Examination: normal.                         Prophylactic Antibiotics: The patient does not require                         prophylactic  antibiotics. Prior Anticoagulants: The                         patient has taken no anticoagulant or antiplatelet                         agents. ASA Grade Assessment: I - A normal, healthy                         patient. After reviewing the risks and benefits, the                         patient was deemed in satisfactory condition to                         undergo the procedure. The anesthesia plan was to use                         general anesthesia. Immediately prior to                         administration of medications, the patient was                         re-assessed for adequacy to receive sedatives. The                         heart rate, respiratory rate, oxygen saturations,                         blood pressure, adequacy of pulmonary ventilation, and                         response to care were monitored throughout the                         procedure. The physical status of the patient was                         re-assessed after the procedure.                        After obtaining informed consent, the colonoscope was                         passed under direct vision. Throughout the procedure,                         the patient's blood pressure, pulse, and oxygen                         saturations were monitored continuously. The  Colonoscope was introduced through the anus and                         advanced to the the cecum, identified by appendiceal                         orifice and ileocecal valve. The colonoscopy was                         performed without difficulty. The patient tolerated                         the procedure well. The quality of the bowel                         preparation was evaluated using the BBPS [facility] Bowel                         Preparation Scale) with scores of: Right Colon = 3,                         Transverse Colon = 3 and Left Colon = 3 (entire mucosa                         seen well with no  residual staining, small fragments                         of stool or opaque liquid). The total BBPS score                         equals 9. The ileocecal valve, appendiceal orifice,                         and rectum were photographed. Findings:      The perianal and digital rectal examinations were normal. Pertinent       negatives include normal sphincter tone and no palpable rectal lesions.      A few small-mouthed diverticula were found in the sigmoid colon.      The retroflexed view of the distal rectum and anal verge was normal and       showed no anal or rectal abnormalities.      The exam was otherwise without abnormality. Impression:            - Diverticulosis in the sigmoid colon.                        - The distal rectum and anal verge are normal on                         retroflexion view.                        - The examination was otherwise normal.                        - No specimens collected. Recommendation:        - Discharge patient to home (with escort).                        -  Resume previous diet today.                        - Continue present medications.                        - Repeat colonoscopy in 10 years for screening                         purposes. Procedure Code(s):     --- Professional ---                        Z6109, Colorectal cancer screening; colonoscopy on                         individual at high risk Diagnosis Code(s):     --- Professional ---                        Z86.010, Personal history of colonic polyps                        K57.30, Diverticulosis of large intestine without                         perforation or abscess without bleeding CPT copyright 2022 American Medical Association. All rights reserved. The codes documented in this report are preliminary and upon coder review may  be revised to meet current compliance requirements. Dr. Libby Maw Toney Reil MD, MD 12/14/2023 10:07:04 AM This report has been signed  electronically. Number of Addenda: 0 Note Initiated On: 12/14/2023 9:47 AM Scope Withdrawal Time: 0 hours 7 minutes 24 seconds  Total Procedure Duration: 0 hours 10 minutes 53 seconds  Estimated Blood Loss:  Estimated blood loss: none.      [facility]

## 2023-12-14 NOTE — Anesthesia Postprocedure Evaluation (Signed)
Anesthesia Post Note  Patient: Madison Dixon  Procedure(s) Performed: COLONOSCOPY WITH PROPOFOL  Patient location during evaluation: PACU Anesthesia Type: General Level of consciousness: awake and oriented Pain management: pain level controlled Vital Signs Assessment: post-procedure vital signs reviewed and stable Respiratory status: spontaneous breathing Cardiovascular status: stable Anesthetic complications: no   No notable events documented.   Last Vitals:  Vitals:   12/14/23 1011 12/14/23 1021  BP: 109/61 104/67  Pulse: 86   Resp: (!) 21 18  Temp:    SpO2: 100% 100%    Last Pain:  Vitals:   12/14/23 1021  TempSrc:   PainSc: 0-No pain                 VAN STAVEREN,Delara Shepheard

## 2023-12-15 ENCOUNTER — Encounter: Payer: Self-pay | Admitting: Gastroenterology

## 2023-12-24 DIAGNOSIS — Z419 Encounter for procedure for purposes other than remedying health state, unspecified: Secondary | ICD-10-CM | POA: Diagnosis not present

## 2024-01-02 ENCOUNTER — Other Ambulatory Visit: Payer: Self-pay

## 2024-01-05 ENCOUNTER — Ambulatory Visit: Payer: Medicaid Other | Admitting: Physician Assistant

## 2024-01-05 NOTE — Progress Notes (Deleted)
 Celso Amy, PA-C [address]  [address]  [place], [place] [address]  Main: [phone]  Fax: [phone]   Primary Care Physician: Jacky Kindle, FNP  Primary Gastroenterologist:  ***  CC:  HPI: Madison Dixon is a 28 y.o. female  Current Outpatient Medications  Medication Sig Dispense Refill   dicyclomine (BENTYL) 10 MG capsule Take 1 capsule (10 mg total) by mouth 3 (three) times daily before meals. 90 capsule 2   hydrocortisone (ANUSOL-HC) 2.5 % rectal cream Place 1 Application rectally 2 (two) times daily. 30 g 1   levonorgestrel (MIRENA) 20 MCG/DAY IUD 1 each by Intrauterine route once.     lithium carbonate (ESKALITH) 450 MG ER tablet Take by mouth at bedtime.     lurasidone (LATUDA) 20 MG TABS tablet Take by mouth daily with supper.     Na Sulfate-K Sulfate-Mg Sulfate concentrate (SUPREP) 17.5-3.13-1.6 GM/177ML SOLN PLEASE SEE ATTACHED FOR DETAILED DIRECTIONS     No current facility-administered medications for this visit.    Allergies as of 01/05/2024 - Review Complete 12/14/2023  Allergen Reaction Noted   Aripiprazole Nausea And Vomiting 11/29/2016   Vicodin [hydrocodone-acetaminophen] Nausea And Vomiting 09/01/2016    Past Medical History:  Diagnosis Date   Anxiety    Asthma    sports induced   Bipolar disorder (HCC)    Chronic pelvic pain in female 09/09/2016   Depression    Endometriosis determined by laparoscopy 09/09/2016   Ovarian cyst    Pilonidal cyst    PONV (postoperative nausea and vomiting)    PTSD (post-traumatic stress disorder)     Past Surgical History:  Procedure Laterality Date   COLONOSCOPY WITH ESOPHAGOGASTRODUODENOSCOPY (EGD)     COLONOSCOPY WITH PROPOFOL N/A 12/14/2023   Procedure: COLONOSCOPY WITH PROPOFOL;  Surgeon: Toney Reil, MD;  Location: [facility];  Service: Gastroenterology;  Laterality: N/A;   DILATION AND EVACUATION N/A 01/13/2021   Procedure: Suction D&C;  Surgeon: Vena Austria, MD;   Location: [facility];  Service: Gynecology;  Laterality: N/A;   ESOPHAGOGASTRODUODENOSCOPY     LAPAROSCOPY N/A 09/09/2016   Procedure: LAPAROSCOPY DIAGNOSTIC, FULGERATION OF ENDOMETRIOSIS;  Surgeon: Conard Novak, MD;  Location: [facility];  Service: Gynecology;  Laterality: N/A;   PILONIDAL CYST EXCISION N/A 04/10/2015   Procedure: CYST EXCISION PILONIDAL EXTENSIVE;  Surgeon: Kieth Brightly, MD;  Location: [facility];  Service: General;  Laterality: N/A;   WISDOM TOOTH EXTRACTION     lower two taken out;  age 46    Review of Systems:    All systems reviewed and negative except where noted in HPI.   Physical Examination:   LMP 12/05/2023 (Approximate)   General: Well-nourished, well-developed in no acute distress.  Lungs: Clear to auscultation bilaterally. Non-labored. Heart: Regular rate and rhythm, no murmurs rubs or gallops.  Abdomen: Bowel sounds are normal; Abdomen is Soft; No hepatosplenomegaly, masses or hernias;  No Abdominal Tenderness; No guarding or rebound tenderness. Neuro: Alert and oriented x 3.  Grossly intact.  Psych: Alert and cooperative, normal mood and affect.   Imaging Studies: NM Hepato W/EjeCT Fract Result Date: 12/08/2023 CLINICAL DATA:  Right upper quadrant abdominal pain. EXAM: NUCLEAR MEDICINE HEPATOBILIARY IMAGING WITH GALLBLADDER EF TECHNIQUE: Sequential images of the abdomen were obtained out to 60 minutes following intravenous administration of radiopharmaceutical. After oral ingestion of Ensure, gallbladder ejection fraction was determined. At 60 min, normal ejection fraction is greater than 33%. RADIOPHARMACEUTICALS:  5.13 mCi Tc-15m  Choletec IV COMPARISON:  CT June 30, 2023 FINDINGS: Prompt uptake and biliary excretion of activity by the liver is seen. Gallbladder activity is visualized, consistent with patency of cystic duct. Biliary activity passes into small bowel, consistent with patent common bile duct. Calculated gallbladder ejection  fraction is 72%. (Normal gallbladder ejection fraction with Ensure is greater than 33% and less than 80%.) IMPRESSION: 1.  Patent cystic and common bile ducts. 2.  Normal gallbladder ejection fraction. Electronically Signed   By: Maudry Mayhew M.D.   On: 12/08/2023 17:25    Assessment and Plan:   MYKENZI VANZILE is a 28 y.o. y/o female ***    Celso Amy, PA-C  Follow up ***  BP check ***

## 2024-02-04 DIAGNOSIS — Z419 Encounter for procedure for purposes other than remedying health state, unspecified: Secondary | ICD-10-CM | POA: Diagnosis not present

## 2024-02-20 ENCOUNTER — Ambulatory Visit: Admitting: Gastroenterology

## 2024-02-28 ENCOUNTER — Ambulatory Visit: Admitting: Family Medicine

## 2024-03-05 DIAGNOSIS — Z419 Encounter for procedure for purposes other than remedying health state, unspecified: Secondary | ICD-10-CM | POA: Diagnosis not present

## 2024-04-05 DIAGNOSIS — Z419 Encounter for procedure for purposes other than remedying health state, unspecified: Secondary | ICD-10-CM | POA: Diagnosis not present

## 2024-05-05 DIAGNOSIS — Z419 Encounter for procedure for purposes other than remedying health state, unspecified: Secondary | ICD-10-CM | POA: Diagnosis not present

## 2024-06-05 DIAGNOSIS — Z419 Encounter for procedure for purposes other than remedying health state, unspecified: Secondary | ICD-10-CM | POA: Diagnosis not present

## 2024-07-06 DIAGNOSIS — Z419 Encounter for procedure for purposes other than remedying health state, unspecified: Secondary | ICD-10-CM | POA: Diagnosis not present
